# Patient Record
Sex: Female | Born: 1935
Health system: Southern US, Community
[De-identification: ages and names within clinical notes are randomized; demographics above are authoritative.]

## PROBLEM LIST (undated history)

## (undated) DIAGNOSIS — H532 Diplopia: Secondary | ICD-10-CM

## (undated) DIAGNOSIS — I10 Essential (primary) hypertension: Secondary | ICD-10-CM

## (undated) DIAGNOSIS — E119 Type 2 diabetes mellitus without complications: Secondary | ICD-10-CM

## (undated) DIAGNOSIS — N189 Chronic kidney disease, unspecified: Secondary | ICD-10-CM

## (undated) DIAGNOSIS — D649 Anemia, unspecified: Secondary | ICD-10-CM

## (undated) DIAGNOSIS — E7800 Pure hypercholesterolemia, unspecified: Secondary | ICD-10-CM

## (undated) DIAGNOSIS — G8929 Other chronic pain: Secondary | ICD-10-CM

## (undated) HISTORY — DX: Diplopia: H53.2

## (undated) HISTORY — PX: CHOLECYSTECTOMY: SHX55

## (undated) HISTORY — PX: CYST EXCISION: SHX5701

---

## 2003-04-10 ENCOUNTER — Ambulatory Visit (HOSPITAL_COMMUNITY): Admission: RE | Admit: 2003-04-10 | Discharge: 2003-04-10 | Payer: Self-pay | Admitting: Orthopaedic Surgery

## 2003-04-10 ENCOUNTER — Encounter: Payer: Self-pay | Admitting: Orthopaedic Surgery

## 2003-04-20 ENCOUNTER — Encounter: Payer: Self-pay | Admitting: Neurosurgery

## 2003-04-24 ENCOUNTER — Encounter: Payer: Self-pay | Admitting: Neurosurgery

## 2003-04-24 ENCOUNTER — Encounter (INDEPENDENT_AMBULATORY_CARE_PROVIDER_SITE_OTHER): Payer: Self-pay | Admitting: Specialist

## 2003-04-24 ENCOUNTER — Inpatient Hospital Stay (HOSPITAL_COMMUNITY): Admission: RE | Admit: 2003-04-24 | Discharge: 2003-04-26 | Payer: Self-pay | Admitting: Neurosurgery

## 2004-08-08 ENCOUNTER — Ambulatory Visit (HOSPITAL_COMMUNITY): Admission: RE | Admit: 2004-08-08 | Discharge: 2004-08-08 | Payer: Self-pay | Admitting: Family Medicine

## 2004-09-30 ENCOUNTER — Observation Stay (HOSPITAL_COMMUNITY): Admission: RE | Admit: 2004-09-30 | Discharge: 2004-10-01 | Payer: Self-pay | Admitting: General Surgery

## 2006-06-28 ENCOUNTER — Ambulatory Visit (HOSPITAL_COMMUNITY): Admission: RE | Admit: 2006-06-28 | Discharge: 2006-06-28 | Payer: Self-pay | Admitting: Family Medicine

## 2008-02-02 ENCOUNTER — Ambulatory Visit (HOSPITAL_COMMUNITY): Admission: RE | Admit: 2008-02-02 | Discharge: 2008-02-02 | Payer: Self-pay | Admitting: Nephrology

## 2010-07-25 DIAGNOSIS — N189 Chronic kidney disease, unspecified: Secondary | ICD-10-CM | POA: Insufficient documentation

## 2010-07-25 DIAGNOSIS — I10 Essential (primary) hypertension: Secondary | ICD-10-CM | POA: Insufficient documentation

## 2010-07-25 DIAGNOSIS — E1165 Type 2 diabetes mellitus with hyperglycemia: Secondary | ICD-10-CM | POA: Insufficient documentation

## 2010-07-25 DIAGNOSIS — M545 Low back pain, unspecified: Secondary | ICD-10-CM | POA: Insufficient documentation

## 2010-07-25 DIAGNOSIS — D631 Anemia in chronic kidney disease: Secondary | ICD-10-CM | POA: Insufficient documentation

## 2010-07-25 DIAGNOSIS — E7800 Pure hypercholesterolemia, unspecified: Secondary | ICD-10-CM | POA: Insufficient documentation

## 2010-07-25 DIAGNOSIS — E1121 Type 2 diabetes mellitus with diabetic nephropathy: Secondary | ICD-10-CM | POA: Insufficient documentation

## 2010-10-26 ENCOUNTER — Encounter: Payer: Self-pay | Admitting: Family Medicine

## 2011-02-20 NOTE — Op Note (Signed)
   NAME:  Stephanie Middleton, Stephanie Middleton                        ACCOUNT NO.:  [id number]   MEDICAL RECORD NO.:  [id number]                   PATIENT TYPE:  INP   LOCATION:  3172                                 FACILITY:  [facility]   PHYSICIAN:  Leeroy Cha, M.D.                DATE OF BIRTH:  May 18, 1931   DATE OF PROCEDURE:  04/24/2003  DATE OF DISCHARGE:                                 OPERATIVE REPORT   PREOPERATIVE DIAGNOSIS:  Right L4-L5 synovial cyst with compression on the  thecal sac and L5 radiculopathy.   POSTOPERATIVE DIAGNOSIS:  Right L4-L5 synovial cyst with compression on the  thecal sac and L5 radiculopathy.   PROCEDURES:  Right L4-5 laminotomy, excision of a large synovial cyst,  decompression of the thecal sac and L4 nerve root, microscope.  Foraminotomy.   SURGEON:  Leeroy Cha, M.D.   ASSISTANT:  Hosie Spangle, M.D.   CLINICAL HISTORY:  The patient is admitted because of back and right leg  pain.  The patient had been complaining of pain with weakness.  MRI shows a  large cyst at the level of 4-5.  Surgery was advised.  The risks were  explained to the patient including __________.   PROCEDURE:  The patient was taken to the OR, and she was positioned in a  prone manner.  The back was prepped with Betadine.  A midline incision from  L4 to L5 was made.  Muscle was retracted laterally.  X-ray showed that  indeed we were at the level of L4-5.  We brought the microscope into the  area and with the drill, we drilled the lower lamina of L4 and the upper of  L5.  A thick yellow ligament was also excised.  Indeed, we found that there  was a cyst attached to the thecal sac and going along the L5 nerve root.  Microdissection was removed and the cyst was removed in to toto.  Having  done this, investigation of the L5 and L4 nerve root was normal.  Foraminotomy was accomplished.  Valsalva maneuver was negative.  From then  on the area was irrigated.  The wound was closed with  Vicryl and a Steri-  Strip.  The patient did well.                                                 Leeroy Cha, M.D.    EB/MEDQ  D:  04/24/2003  T:  04/24/2003  Job:  [id number]

## 2011-02-20 NOTE — Op Note (Signed)
NAME:  [name], [name]              ACCOUNT NO.:  [id number]   MEDICAL RECORD NO.:  [id number]          PATIENT TYPE:  AMB   LOCATION:  DAY                           FACILITY:  [facility]   PHYSICIAN:  Leroy C. Tamala Julian, M.D.   DATE OF BIRTH:  [birth year]   DATE OF PROCEDURE:  09/30/2004  DATE OF DISCHARGE:                                 OPERATIVE REPORT   PREOPERATIVE DIAGNOSIS:  Cholelithiasis, cholecystitis.   POSTOPERATIVE DIAGNOSIS:  Cholelithiasis, cholecystitis.   PROCEDURE:  Laparoscopic cholecystectomy.   SURGEON:  Vernon Prey. Tamala Julian, M.D.   DESCRIPTION:  Under general endotracheal anesthesia, the patient's abdomen  was prepped and draped in a sterile field.  A supraumbilical incision was  made and Veress needle was inserted uneventfully.  Abdomen was insufflated  with 3 L of CO2.  Using a Visiport guide, a 10 mm port was placed  uneventfully.  The laparoscope was placed.  A distended, thickened  gallbladder was evident.  Under videoscopic guidance, a 10 mm port and two 5  mm ports were placed in the right subcostal region.  The gallbladder was  grasped and positioned.  The cystic duct was dissected, clipped with five  clips and divided.  There were two cystic artery branches.  These were  _clipped_________ with three clips and divided.  The gallbladder was  separated from its hepatic bed without difficulty.  There was no bleeding  and no bile leak.  The gallbladder was placed in an EndoCatch device and  retrieved.  Irrigation was carried out.  The fluid was entirely clear.  CO2  was allowed to escape from the abdomen, and the ports were removed.  The  incisions were closed using 0 Dexon on the fascia at the umbilicus and  staples on the skin.  Dressings were placed.  She was awakened from  anesthesia uneventfully, transferred to a bed, and taken to the  postanesthetic care unit for further monitoring.     Lero   LCS/MEDQ  D:  09/30/2004  T:  09/30/2004  Job:  [id number]

## 2011-02-20 NOTE — H&P (Signed)
NAME:  BHARGAVI, WALLOCK                        ACCOUNT NO.:  [id number]   MEDICAL RECORD NO.:  [id number]                   PATIENT TYPE:  INP   LOCATION:  3172                                 FACILITY:  [facility]   PHYSICIAN:  Leeroy Cha, M.D.                DATE OF BIRTH:  [birth year]   DATE OF ADMISSION:  04/24/2003  DATE OF DISCHARGE:                                HISTORY & PHYSICAL   HISTORY OF PRESENT ILLNESS:  Mrs. Zaring is a lady who was seen in my office  last week complaining of back pain with radiation down to the right leg  since April of this year, which is getting worse.  Any type of movement  triggered the pain going to the right foot.  She is quite uncomfortable.  She denies any problem with the left leg.  She had an MRI and because of the  findings, she wants to proceed with surgery.   PAST MEDICAL HISTORY:  Negative.   MEDICATIONS:  She is taking medication for her diabetes, some verapamil,  Diovan, and another one that she does not recall the name of.   SOCIAL HISTORY:  Negative.   FAMILY HISTORY:  Her mother died of leukemia.  Her father died of heart  disease.   REVIEW OF SYSTEMS:  Positive for back and right leg pain and diabetes.   PHYSICAL EXAMINATION:  GENERAL APPEARANCE:  When the patient came to see me,  she was limping from the right leg.  HEENT:  Normal.  NECK:  Normal.  LUNGS:  There is some mild rhonchi bilaterally.  HEART:  Heart sounds normal.  ABDOMEN:  Normal.  EXTREMITIES:  Normal.  BACK:  She has had diffuse tenderness to the lumbar spine.  NEUROLOGIC:  Mental status normal.  Cranial nerves normal.  Her strength is  completely normal, except that she has 3/5 weakness on dorsiflexion of the  right foot.  Reflexes 1+.  Sensation:  She complained of numbness on the top  of the right foot.   LABORATORY DATA:  The MRI shows multiple levels of degenerative joint  disease, but she has a large synovial cyst with displacement of the thecal  sac at the level of L4-5.   CLINICAL IMPRESSION:  1. Synovial cyst at right L4-5.  2. Degenerative disk disease.  3. Anemia.   RECOMMENDATIONS:  The patient wants to proceed with surgery.  The procedure  will be a right L4-5 diskectomy with foraminotomy.  She knows about the  risks, such as the need for further surgery with failure of  fusion, CSF leak, and infection.  Also, she is aware that we do not know why  she is having anemia.  We called her medical doctor and they do not have any  information.  After surgery, we are going to obtain a CBC and probably we  can get one of the hematologists to see her as  an inpatient or outpatient.                                               Leeroy Cha, M.D.    EB/MEDQ  D:  04/24/2003  T:  04/24/2003  Job:  [id number]

## 2011-02-20 NOTE — H&P (Signed)
NAME:  Stephanie Middleton, Stephanie Middleton              ACCOUNT NO.:  [id number]   MEDICAL RECORD NO.:  [id number]          PATIENT TYPE:  AMB   LOCATION:  DAY                           FACILITY:  [facility]   PHYSICIAN:  Leroy C. Tamala Julian, M.D.   DATE OF BIRTH:  16-Dec-1930   DATE OF ADMISSION:  DATE OF DISCHARGE:  LH                                HISTORY & PHYSICAL   HISTORY OF PRESENT ILLNESS:  A 77 year old female with a history of upper  abdominal pain with nausea and vomiting in October of 2005.  The patient had  an ultrasound done at that time which showed gallbladder wall thickening  with multiple gallstones, compatible with acute cholecystitis.  She has been  advised to proceed with cholecystectomy.   PAST HISTORY:  1.  Diabetes mellitus.  2.  Chronic anemia.  3.  Hypertension.  4.  Hyperlipidemia.   MEDICATIONS:  1.  Avandia 4 mg daily.  2.  Accupril 40 mg daily.  3.  Verapamil 240 mg daily.  4.  Glipizide 5 mg b.i.d.  5.  Prevacid 30 mg daily.   PHYSICAL EXAMINATION:  VITAL SIGNS:  Blood pressure 120/60, pulse 84,  respirations 18.  Weight 192 pounds.  HEENT:  Unremarkable.  NECK:  Supple.  No JVD or bruit.  CHEST:  Clear to auscultation.  HEART:  Regular rate and rhythm without murmur, gallop, or rub.  ABDOMEN:  Soft, nontender.  No masses.  EXTREMITIES:  No clubbing, cyanosis, or edema.  NEUROLOGIC:  No focal motor, sensory, or cerebellar deficit.   IMPRESSION:  1.  Cholecystitis with cholelithiasis.  2.  Diabetes mellitus.  3.  Hypertension.  4.  Chronic anemia.   PLAN:  Laparoscopic cholecystectomy.     Lero   LCS/MEDQ  D:  09/29/2004  T:  09/30/2004  Job:  [id number]

## 2013-08-08 ENCOUNTER — Other Ambulatory Visit (HOSPITAL_COMMUNITY): Payer: Self-pay | Admitting: Family Medicine

## 2013-08-08 ENCOUNTER — Ambulatory Visit (HOSPITAL_COMMUNITY)
Admission: RE | Admit: 2013-08-08 | Discharge: 2013-08-08 | Disposition: A | Discharge status: Home / Self Care | Payer: Medicare PPO | Source: Ambulatory Visit | Attending: Family Medicine | Admitting: Family Medicine

## 2013-08-08 DIAGNOSIS — R52 Pain, unspecified: Secondary | ICD-10-CM

## 2013-08-08 DIAGNOSIS — M79609 Pain in unspecified limb: Secondary | ICD-10-CM | POA: Insufficient documentation

## 2013-12-11 ENCOUNTER — Ambulatory Visit (HOSPITAL_COMMUNITY)
Admission: RE | Admit: 2013-12-11 | Discharge: 2013-12-11 | Disposition: A | Discharge status: Home / Self Care | Payer: Medicare PPO | Source: Ambulatory Visit | Attending: Family Medicine | Admitting: Family Medicine

## 2013-12-11 ENCOUNTER — Other Ambulatory Visit (HOSPITAL_COMMUNITY): Payer: Self-pay | Admitting: Family Medicine

## 2013-12-11 DIAGNOSIS — G8929 Other chronic pain: Secondary | ICD-10-CM

## 2013-12-11 DIAGNOSIS — M25569 Pain in unspecified knee: Secondary | ICD-10-CM | POA: Insufficient documentation

## 2014-12-12 DIAGNOSIS — I10 Essential (primary) hypertension: Secondary | ICD-10-CM | POA: Diagnosis not present

## 2014-12-12 DIAGNOSIS — D638 Anemia in other chronic diseases classified elsewhere: Secondary | ICD-10-CM | POA: Diagnosis not present

## 2014-12-12 DIAGNOSIS — N183 Chronic kidney disease, stage 3 (moderate): Secondary | ICD-10-CM | POA: Diagnosis not present

## 2014-12-12 DIAGNOSIS — N2581 Secondary hyperparathyroidism of renal origin: Secondary | ICD-10-CM | POA: Diagnosis not present

## 2015-03-16 DIAGNOSIS — D519 Vitamin B12 deficiency anemia, unspecified: Secondary | ICD-10-CM | POA: Diagnosis not present

## 2015-03-16 DIAGNOSIS — R809 Proteinuria, unspecified: Secondary | ICD-10-CM | POA: Diagnosis not present

## 2015-03-16 DIAGNOSIS — Z79899 Other long term (current) drug therapy: Secondary | ICD-10-CM | POA: Diagnosis not present

## 2015-03-16 DIAGNOSIS — N183 Chronic kidney disease, stage 3 (moderate): Secondary | ICD-10-CM | POA: Diagnosis not present

## 2015-03-16 DIAGNOSIS — I10 Essential (primary) hypertension: Secondary | ICD-10-CM | POA: Diagnosis not present

## 2015-03-16 DIAGNOSIS — E559 Vitamin D deficiency, unspecified: Secondary | ICD-10-CM | POA: Diagnosis not present

## 2015-03-20 DIAGNOSIS — I10 Essential (primary) hypertension: Secondary | ICD-10-CM | POA: Diagnosis not present

## 2015-03-20 DIAGNOSIS — N2581 Secondary hyperparathyroidism of renal origin: Secondary | ICD-10-CM | POA: Diagnosis not present

## 2015-03-20 DIAGNOSIS — N183 Chronic kidney disease, stage 3 (moderate): Secondary | ICD-10-CM | POA: Diagnosis not present

## 2015-03-20 DIAGNOSIS — D638 Anemia in other chronic diseases classified elsewhere: Secondary | ICD-10-CM | POA: Diagnosis not present

## 2015-04-25 DIAGNOSIS — I10 Essential (primary) hypertension: Secondary | ICD-10-CM | POA: Diagnosis not present

## 2015-04-25 DIAGNOSIS — E1122 Type 2 diabetes mellitus with diabetic chronic kidney disease: Secondary | ICD-10-CM | POA: Diagnosis not present

## 2015-05-17 DIAGNOSIS — H5211 Myopia, right eye: Secondary | ICD-10-CM | POA: Diagnosis not present

## 2015-05-17 DIAGNOSIS — H25819 Combined forms of age-related cataract, unspecified eye: Secondary | ICD-10-CM | POA: Diagnosis not present

## 2015-05-17 DIAGNOSIS — H18419 Arcus senilis, unspecified eye: Secondary | ICD-10-CM | POA: Diagnosis not present

## 2015-05-17 DIAGNOSIS — H18899 Other specified disorders of cornea, unspecified eye: Secondary | ICD-10-CM | POA: Diagnosis not present

## 2015-07-10 DIAGNOSIS — I10 Essential (primary) hypertension: Secondary | ICD-10-CM | POA: Diagnosis not present

## 2015-07-10 DIAGNOSIS — Z79899 Other long term (current) drug therapy: Secondary | ICD-10-CM | POA: Diagnosis not present

## 2015-07-10 DIAGNOSIS — D519 Vitamin B12 deficiency anemia, unspecified: Secondary | ICD-10-CM | POA: Diagnosis not present

## 2015-07-10 DIAGNOSIS — N183 Chronic kidney disease, stage 3 (moderate): Secondary | ICD-10-CM | POA: Diagnosis not present

## 2015-07-10 DIAGNOSIS — R809 Proteinuria, unspecified: Secondary | ICD-10-CM | POA: Diagnosis not present

## 2015-07-10 DIAGNOSIS — E559 Vitamin D deficiency, unspecified: Secondary | ICD-10-CM | POA: Diagnosis not present

## 2015-07-17 DIAGNOSIS — D638 Anemia in other chronic diseases classified elsewhere: Secondary | ICD-10-CM | POA: Diagnosis not present

## 2015-07-17 DIAGNOSIS — R809 Proteinuria, unspecified: Secondary | ICD-10-CM | POA: Diagnosis not present

## 2015-07-17 DIAGNOSIS — N184 Chronic kidney disease, stage 4 (severe): Secondary | ICD-10-CM | POA: Diagnosis not present

## 2015-07-17 DIAGNOSIS — N2581 Secondary hyperparathyroidism of renal origin: Secondary | ICD-10-CM | POA: Diagnosis not present

## 2015-07-30 DIAGNOSIS — R739 Hyperglycemia, unspecified: Secondary | ICD-10-CM | POA: Diagnosis not present

## 2015-07-30 DIAGNOSIS — E1122 Type 2 diabetes mellitus with diabetic chronic kidney disease: Secondary | ICD-10-CM | POA: Diagnosis not present

## 2015-07-30 DIAGNOSIS — N183 Chronic kidney disease, stage 3 (moderate): Secondary | ICD-10-CM | POA: Diagnosis not present

## 2015-07-30 DIAGNOSIS — Z23 Encounter for immunization: Secondary | ICD-10-CM | POA: Diagnosis not present

## 2015-10-02 DIAGNOSIS — R809 Proteinuria, unspecified: Secondary | ICD-10-CM | POA: Diagnosis not present

## 2015-10-02 DIAGNOSIS — D519 Vitamin B12 deficiency anemia, unspecified: Secondary | ICD-10-CM | POA: Diagnosis not present

## 2015-10-02 DIAGNOSIS — Z79899 Other long term (current) drug therapy: Secondary | ICD-10-CM | POA: Diagnosis not present

## 2015-10-02 DIAGNOSIS — I10 Essential (primary) hypertension: Secondary | ICD-10-CM | POA: Diagnosis not present

## 2015-10-02 DIAGNOSIS — E559 Vitamin D deficiency, unspecified: Secondary | ICD-10-CM | POA: Diagnosis not present

## 2015-10-02 DIAGNOSIS — N183 Chronic kidney disease, stage 3 (moderate): Secondary | ICD-10-CM | POA: Diagnosis not present

## 2016-01-28 ENCOUNTER — Other Ambulatory Visit (HOSPITAL_COMMUNITY): Payer: Self-pay | Admitting: Family Medicine

## 2016-01-28 ENCOUNTER — Ambulatory Visit (HOSPITAL_COMMUNITY)
Admission: RE | Admit: 2016-01-28 | Discharge: 2016-01-28 | Disposition: A | Discharge status: Home / Self Care | Payer: Medicare Other | Source: Ambulatory Visit | Attending: Family Medicine | Admitting: Family Medicine

## 2016-01-28 DIAGNOSIS — G8929 Other chronic pain: Secondary | ICD-10-CM | POA: Diagnosis present

## 2016-01-28 DIAGNOSIS — M25551 Pain in right hip: Secondary | ICD-10-CM | POA: Diagnosis not present

## 2016-01-28 DIAGNOSIS — M47896 Other spondylosis, lumbar region: Secondary | ICD-10-CM | POA: Diagnosis not present

## 2016-05-13 ENCOUNTER — Other Ambulatory Visit (HOSPITAL_COMMUNITY): Payer: Self-pay | Admitting: Rheumatology

## 2016-05-13 DIAGNOSIS — M5416 Radiculopathy, lumbar region: Secondary | ICD-10-CM

## 2016-05-26 ENCOUNTER — Ambulatory Visit (HOSPITAL_COMMUNITY)
Admission: RE | Admit: 2016-05-26 | Discharge: 2016-05-26 | Disposition: A | Discharge status: Home / Self Care | Payer: Medicare Other | Source: Ambulatory Visit | Attending: Rheumatology | Admitting: Rheumatology

## 2016-05-26 DIAGNOSIS — M545 Low back pain: Secondary | ICD-10-CM | POA: Insufficient documentation

## 2016-05-26 DIAGNOSIS — M5416 Radiculopathy, lumbar region: Secondary | ICD-10-CM

## 2016-05-26 DIAGNOSIS — M4806 Spinal stenosis, lumbar region: Secondary | ICD-10-CM | POA: Diagnosis not present

## 2016-07-23 ENCOUNTER — Encounter (HOSPITAL_COMMUNITY)
Admission: RE | Admit: 2016-07-23 | Discharge: 2016-07-23 | Disposition: A | Discharge status: Home / Self Care | Payer: Medicare Other | Source: Ambulatory Visit | Attending: Nephrology | Admitting: Nephrology

## 2016-07-23 ENCOUNTER — Encounter (HOSPITAL_COMMUNITY): Payer: Self-pay

## 2016-07-23 DIAGNOSIS — N184 Chronic kidney disease, stage 4 (severe): Secondary | ICD-10-CM | POA: Diagnosis not present

## 2016-07-23 DIAGNOSIS — D509 Iron deficiency anemia, unspecified: Secondary | ICD-10-CM | POA: Insufficient documentation

## 2016-07-23 LAB — POCT HEMOGLOBIN-HEMACUE: Hemoglobin: 8.7 g/dL — ABNORMAL LOW (ref 12.0–15.0)

## 2016-07-23 MED ORDER — EPOETIN ALFA 3000 UNIT/ML IJ SOLN
4000.0000 [IU] | Freq: Once | INTRAMUSCULAR | Status: AC
  Administered 2016-07-23: 4000 [IU] via SUBCUTANEOUS
  Filled 2016-07-23: qty 2

## 2016-07-23 NOTE — Progress Notes (Signed)
Results for VILDA, ZOLLNER (MRN [id number]) as of 07/23/2016 13:42  Ref. Range 07/23/2016 13:06  Hemoglobin Latest Ref Range: 12.0 - 15.0 g/dL 8.7 (L)

## 2016-08-06 ENCOUNTER — Encounter (HOSPITAL_COMMUNITY)
Admission: RE | Admit: 2016-08-06 | Discharge: 2016-08-06 | Disposition: A | Discharge status: Home / Self Care | Payer: Medicare Other | Source: Ambulatory Visit | Attending: Nephrology | Admitting: Nephrology

## 2016-08-06 DIAGNOSIS — Z5181 Encounter for therapeutic drug level monitoring: Secondary | ICD-10-CM | POA: Insufficient documentation

## 2016-08-06 DIAGNOSIS — D509 Iron deficiency anemia, unspecified: Secondary | ICD-10-CM | POA: Insufficient documentation

## 2016-08-06 DIAGNOSIS — N184 Chronic kidney disease, stage 4 (severe): Secondary | ICD-10-CM | POA: Insufficient documentation

## 2016-08-06 DIAGNOSIS — Z79899 Other long term (current) drug therapy: Secondary | ICD-10-CM | POA: Diagnosis not present

## 2016-08-06 LAB — POCT HEMOGLOBIN-HEMACUE: Hemoglobin: 8.7 g/dL — ABNORMAL LOW (ref 12.0–15.0)

## 2016-08-06 MED ORDER — EPOETIN ALFA 4000 UNIT/ML IJ SOLN
INTRAMUSCULAR | Status: AC
  Filled 2016-08-06: qty 1

## 2016-08-06 MED ORDER — EPOETIN ALFA 4000 UNIT/ML IJ SOLN
4000.0000 [IU] | Freq: Once | INTRAMUSCULAR | Status: AC
  Administered 2016-08-06: 4000 [IU] via SUBCUTANEOUS

## 2016-08-24 ENCOUNTER — Encounter (HOSPITAL_COMMUNITY)
Admission: RE | Admit: 2016-08-24 | Discharge: 2016-08-24 | Disposition: A | Discharge status: Home / Self Care | Payer: Medicare Other | Source: Ambulatory Visit | Attending: Nephrology | Admitting: Nephrology

## 2016-08-24 DIAGNOSIS — N184 Chronic kidney disease, stage 4 (severe): Secondary | ICD-10-CM | POA: Diagnosis not present

## 2016-08-24 LAB — POCT HEMOGLOBIN-HEMACUE: Hemoglobin: 9.5 g/dL — ABNORMAL LOW (ref 12.0–15.0)

## 2016-08-24 MED ORDER — EPOETIN ALFA 4000 UNIT/ML IJ SOLN
4000.0000 [IU] | Freq: Once | INTRAMUSCULAR | Status: AC
  Administered 2016-08-24: 4000 [IU] via SUBCUTANEOUS

## 2016-08-24 MED ORDER — EPOETIN ALFA 4000 UNIT/ML IJ SOLN
INTRAMUSCULAR | Status: AC
  Filled 2016-08-24: qty 1

## 2016-08-24 MED ORDER — EPOETIN ALFA 2000 UNIT/ML IJ SOLN: 4000.0000 [IU] | Freq: Once | INTRAMUSCULAR | Status: DC

## 2016-08-24 NOTE — Progress Notes (Signed)
Results for Stephanie Middleton, Stephanie Middleton (MRN [id number]) as of 08/24/2016 09:55  Ref. Range 08/24/2016 09:53  Hemoglobin Latest Ref Range: 12.0 - 15.0 g/dL 9.5 (L)

## 2016-09-07 ENCOUNTER — Encounter (HOSPITAL_COMMUNITY)
Admission: RE | Admit: 2016-09-07 | Discharge: 2016-09-07 | Disposition: A | Discharge status: Home / Self Care | Payer: Medicare Other | Source: Ambulatory Visit | Attending: Nephrology | Admitting: Nephrology

## 2016-09-07 DIAGNOSIS — N184 Chronic kidney disease, stage 4 (severe): Secondary | ICD-10-CM | POA: Insufficient documentation

## 2016-09-07 DIAGNOSIS — D509 Iron deficiency anemia, unspecified: Secondary | ICD-10-CM | POA: Insufficient documentation

## 2016-09-07 LAB — POCT HEMOGLOBIN-HEMACUE: HEMOGLOBIN: 9.1 g/dL — AB (ref 12.0–15.0)

## 2016-09-07 MED ORDER — EPOETIN ALFA 4000 UNIT/ML IJ SOLN
INTRAMUSCULAR | Status: AC
  Filled 2016-09-07: qty 1

## 2016-09-07 MED ORDER — EPOETIN ALFA 4000 UNIT/ML IJ SOLN
4000.0000 [IU] | INTRAMUSCULAR | Status: DC
  Administered 2016-09-07: 4000 [IU] via SUBCUTANEOUS

## 2016-09-07 NOTE — Progress Notes (Signed)
Results for MORAYMA, GODOWN (MRN [id number]) as of 09/07/2016 09:54  Ref. Range 08/24/2016 09:53  Hemoglobin Latest Ref Range: 12.0 - 15.0 g/dL 9.5 (L)   Procrit 4000 units SQ given per MD order. Next appointment 09/21/16.

## 2016-09-21 ENCOUNTER — Encounter (HOSPITAL_COMMUNITY): Payer: Self-pay

## 2016-09-21 ENCOUNTER — Encounter (HOSPITAL_COMMUNITY)
Admission: RE | Admit: 2016-09-21 | Discharge: 2016-09-21 | Disposition: A | Discharge status: Home / Self Care | Payer: Medicare Other | Source: Ambulatory Visit | Attending: Nephrology | Admitting: Nephrology

## 2016-09-21 DIAGNOSIS — N184 Chronic kidney disease, stage 4 (severe): Secondary | ICD-10-CM | POA: Diagnosis not present

## 2016-09-21 LAB — POCT HEMOGLOBIN-HEMACUE: HEMOGLOBIN: 9.8 g/dL — AB (ref 12.0–15.0)

## 2016-09-21 MED ORDER — EPOETIN ALFA 4000 UNIT/ML IJ SOLN
INTRAMUSCULAR | Status: AC
  Filled 2016-09-21: qty 1

## 2016-09-21 MED ORDER — EPOETIN ALFA 4000 UNIT/ML IJ SOLN
4000.0000 [IU] | Freq: Once | INTRAMUSCULAR | Status: AC
  Administered 2016-09-21: 4000 [IU] via SUBCUTANEOUS

## 2016-09-21 NOTE — Progress Notes (Signed)
Results for Stephanie Middleton, Stephanie Middleton (MRN [id number]) as of 09/21/2016 10:54  Ref. Range 09/21/2016 10:03  Hemoglobin Latest Ref Range: 12.0 - 15.0 g/dL 9.8 (L)

## 2016-10-06 ENCOUNTER — Encounter (HOSPITAL_COMMUNITY)
Admission: RE | Admit: 2016-10-06 | Discharge: 2016-10-06 | Disposition: A | Discharge status: Home / Self Care | Payer: Medicare Other | Source: Ambulatory Visit | Attending: Nephrology | Admitting: Nephrology

## 2016-10-06 DIAGNOSIS — D509 Iron deficiency anemia, unspecified: Secondary | ICD-10-CM | POA: Diagnosis not present

## 2016-10-06 DIAGNOSIS — N184 Chronic kidney disease, stage 4 (severe): Secondary | ICD-10-CM | POA: Diagnosis not present

## 2016-10-06 LAB — POCT HEMOGLOBIN-HEMACUE: Hemoglobin: 9.6 g/dL — ABNORMAL LOW (ref 12.0–15.0)

## 2016-10-06 MED ORDER — EPOETIN ALFA 4000 UNIT/ML IJ SOLN
4000.0000 [IU] | Freq: Once | INTRAMUSCULAR | Status: AC
  Administered 2016-10-06: 4000 [IU] via SUBCUTANEOUS

## 2016-10-06 MED ORDER — EPOETIN ALFA 4000 UNIT/ML IJ SOLN
INTRAMUSCULAR | Status: AC
  Filled 2016-10-06: qty 1

## 2016-10-06 NOTE — Progress Notes (Signed)
Results for Stephanie Middleton, Stephanie Middleton (MRN [id number]) as of 10/06/2016 10:43  Ref. Range 10/06/2016 10:07  Hemoglobin Latest Ref Range: 12.0 - 15.0 g/dL 9.6 (L)

## 2016-10-20 ENCOUNTER — Encounter (HOSPITAL_COMMUNITY)
Admission: RE | Admit: 2016-10-20 | Discharge: 2016-10-20 | Disposition: A | Discharge status: Home / Self Care | Payer: Medicare Other | Source: Ambulatory Visit | Attending: Nephrology | Admitting: Nephrology

## 2016-10-20 DIAGNOSIS — N184 Chronic kidney disease, stage 4 (severe): Secondary | ICD-10-CM | POA: Diagnosis not present

## 2016-10-20 LAB — POCT HEMOGLOBIN-HEMACUE: HEMOGLOBIN: 9.3 g/dL — AB (ref 12.0–15.0)

## 2016-10-20 MED ORDER — EPOETIN ALFA 4000 UNIT/ML IJ SOLN
INTRAMUSCULAR | Status: AC
  Filled 2016-10-20: qty 1

## 2016-10-20 MED ORDER — EPOETIN ALFA 4000 UNIT/ML IJ SOLN
4000.0000 [IU] | INTRAMUSCULAR | Status: DC
Start: 2016-10-20 — End: 2016-10-21
  Administered 2016-10-20: 4000 [IU] via SUBCUTANEOUS

## 2016-10-20 NOTE — Progress Notes (Signed)
Results for Stephanie Middleton, Stephanie Middleton (MRN [id number]) as of 10/20/2016 10:12  Ref. Range 10/20/2016 10:10  Hemoglobin Latest Ref Range: 12.0 - 15.0 g/dL 9.3 (L)

## 2016-11-03 ENCOUNTER — Encounter (HOSPITAL_COMMUNITY)
Admission: RE | Admit: 2016-11-03 | Discharge: 2016-11-03 | Disposition: A | Discharge status: Home / Self Care | Payer: Medicare Other | Source: Ambulatory Visit | Attending: Nephrology | Admitting: Nephrology

## 2016-11-03 DIAGNOSIS — N184 Chronic kidney disease, stage 4 (severe): Secondary | ICD-10-CM | POA: Diagnosis not present

## 2016-11-03 LAB — POCT HEMOGLOBIN-HEMACUE: Hemoglobin: 9.6 g/dL — ABNORMAL LOW (ref 12.0–15.0)

## 2016-11-03 MED ORDER — EPOETIN ALFA 4000 UNIT/ML IJ SOLN
INTRAMUSCULAR | Status: AC
  Filled 2016-11-03: qty 1

## 2016-11-03 MED ORDER — EPOETIN ALFA 4000 UNIT/ML IJ SOLN
4000.0000 [IU] | Freq: Once | INTRAMUSCULAR | Status: AC
  Administered 2016-11-03: 4000 [IU] via SUBCUTANEOUS

## 2016-11-05 NOTE — Progress Notes (Signed)
Results for Stephanie Middleton, Stephanie Middleton (MRN [id number]) as of 11/05/2016 11:04  Ref. Range 11/03/2016 10:08  Hemoglobin Latest Ref Range: 12.0 - 15.0 g/dL 9.6 (L)

## 2016-11-17 ENCOUNTER — Encounter (HOSPITAL_COMMUNITY)
Admission: RE | Admit: 2016-11-17 | Discharge: 2016-11-17 | Disposition: A | Discharge status: Home / Self Care | Payer: Medicare Other | Source: Ambulatory Visit | Attending: Nephrology | Admitting: Nephrology

## 2016-11-17 ENCOUNTER — Encounter (HOSPITAL_COMMUNITY): Payer: Self-pay

## 2016-11-17 DIAGNOSIS — D509 Iron deficiency anemia, unspecified: Secondary | ICD-10-CM | POA: Insufficient documentation

## 2016-11-17 DIAGNOSIS — N184 Chronic kidney disease, stage 4 (severe): Secondary | ICD-10-CM | POA: Insufficient documentation

## 2016-11-17 LAB — POCT HEMOGLOBIN-HEMACUE: HEMOGLOBIN: 10 g/dL — AB (ref 12.0–15.0)

## 2016-11-17 NOTE — Progress Notes (Signed)
Results for Stephanie Middleton, Stephanie Middleton (MRN [id number]) as of 11/17/2016 10:16 No procrit given today, next appointment 12/01/2016 @ 1100.  Ref. Range 11/17/2016 09:59  Hemoglobin Latest Ref Range: 12.0 - 15.0 g/dL 10.0 (L)

## 2016-12-01 ENCOUNTER — Encounter (HOSPITAL_COMMUNITY): Payer: Self-pay

## 2016-12-01 ENCOUNTER — Encounter (HOSPITAL_COMMUNITY)
Admission: RE | Admit: 2016-12-01 | Discharge: 2016-12-01 | Disposition: A | Discharge status: Home / Self Care | Payer: Medicare Other | Source: Ambulatory Visit | Attending: Nephrology | Admitting: Nephrology

## 2016-12-01 DIAGNOSIS — N184 Chronic kidney disease, stage 4 (severe): Secondary | ICD-10-CM | POA: Diagnosis not present

## 2016-12-01 LAB — HEMOGLOBIN AND HEMATOCRIT, BLOOD
HCT: 30.5 % — ABNORMAL LOW (ref 36.0–46.0)
Hemoglobin: 10 g/dL — ABNORMAL LOW (ref 12.0–15.0)

## 2016-12-01 NOTE — Progress Notes (Signed)
Results for DI, JASMER (MRN [id number]) as of 12/01/2016 13:53  Ref. Range 12/01/2016 11:05  Hemoglobin Latest Ref Range: 12.0 - 15.0 g/dL 10.0 (L)  HCT Latest Ref Range: 36.0 - 46.0 % 30.5 (L)

## 2016-12-15 ENCOUNTER — Encounter (HOSPITAL_COMMUNITY)
Admission: RE | Admit: 2016-12-15 | Discharge: 2016-12-15 | Disposition: A | Discharge status: Home / Self Care | Payer: Medicare Other | Source: Ambulatory Visit | Attending: Nephrology | Admitting: Nephrology

## 2016-12-15 DIAGNOSIS — D509 Iron deficiency anemia, unspecified: Secondary | ICD-10-CM | POA: Insufficient documentation

## 2016-12-15 DIAGNOSIS — Z5181 Encounter for therapeutic drug level monitoring: Secondary | ICD-10-CM | POA: Diagnosis not present

## 2016-12-15 DIAGNOSIS — Z79899 Other long term (current) drug therapy: Secondary | ICD-10-CM | POA: Diagnosis not present

## 2016-12-15 DIAGNOSIS — N184 Chronic kidney disease, stage 4 (severe): Secondary | ICD-10-CM | POA: Insufficient documentation

## 2016-12-15 LAB — POCT HEMOGLOBIN-HEMACUE: HEMOGLOBIN: 9.2 g/dL — AB (ref 12.0–15.0)

## 2016-12-15 MED ORDER — EPOETIN ALFA 4000 UNIT/ML IJ SOLN
INTRAMUSCULAR | Status: AC
  Filled 2016-12-15: qty 1

## 2016-12-15 MED ORDER — EPOETIN ALFA 4000 UNIT/ML IJ SOLN
4000.0000 [IU] | Freq: Once | INTRAMUSCULAR | Status: AC
  Administered 2016-12-15: 4000 [IU] via SUBCUTANEOUS

## 2016-12-15 NOTE — Progress Notes (Signed)
Results for KELINA, BEAUCHAMP (MRN [id number]) as of 12/15/2016 11:35  Ref. Range 12/15/2016 10:07  Hemoglobin  Latest Ref Range: 12.0 - 15.0 g/dL 9.2 (L)   Procrit 4000 units SQ given per Md order. Next appt. 12/29/16.

## 2016-12-29 ENCOUNTER — Encounter (HOSPITAL_COMMUNITY)
Admission: RE | Admit: 2016-12-29 | Discharge: 2016-12-29 | Disposition: A | Discharge status: Home / Self Care | Payer: Medicare Other | Source: Ambulatory Visit | Attending: Nephrology | Admitting: Nephrology

## 2016-12-29 DIAGNOSIS — N184 Chronic kidney disease, stage 4 (severe): Secondary | ICD-10-CM | POA: Diagnosis not present

## 2016-12-29 LAB — POCT HEMOGLOBIN-HEMACUE: Hemoglobin: 9.2 g/dL — ABNORMAL LOW (ref 12.0–15.0)

## 2016-12-29 MED ORDER — EPOETIN ALFA 4000 UNIT/ML IJ SOLN
INTRAMUSCULAR | Status: AC
  Filled 2016-12-29: qty 1

## 2016-12-29 MED ORDER — EPOETIN ALFA 4000 UNIT/ML IJ SOLN
4000.0000 [IU] | Freq: Once | INTRAMUSCULAR | Status: AC
  Administered 2016-12-29: 4000 [IU] via SUBCUTANEOUS

## 2016-12-29 NOTE — Progress Notes (Signed)
Results for DAISI, KENTNER (MRN [id number]) as of 12/29/2016 10:18  Ref. Range 12/29/2016 10:07  Hemoglobin Latest Ref Range: 12.0 - 15.0 g/dL 9.2 (L)

## 2017-01-12 ENCOUNTER — Ambulatory Visit (HOSPITAL_COMMUNITY): Payer: Self-pay

## 2017-01-12 ENCOUNTER — Encounter (HOSPITAL_COMMUNITY)
Admission: RE | Admit: 2017-01-12 | Discharge: 2017-01-12 | Disposition: A | Discharge status: Home / Self Care | Payer: Medicare Other | Source: Ambulatory Visit | Attending: Nephrology | Admitting: Nephrology

## 2017-01-12 DIAGNOSIS — N184 Chronic kidney disease, stage 4 (severe): Secondary | ICD-10-CM | POA: Insufficient documentation

## 2017-01-12 DIAGNOSIS — D509 Iron deficiency anemia, unspecified: Secondary | ICD-10-CM | POA: Diagnosis not present

## 2017-01-12 LAB — POCT HEMOGLOBIN-HEMACUE: Hemoglobin: 9.3 g/dL — ABNORMAL LOW (ref 12.0–15.0)

## 2017-01-12 MED ORDER — EPOETIN ALFA 4000 UNIT/ML IJ SOLN
4000.0000 [IU] | Freq: Once | INTRAMUSCULAR | Status: AC
  Administered 2017-01-12: 4000 [IU] via SUBCUTANEOUS

## 2017-01-12 MED ORDER — EPOETIN ALFA 4000 UNIT/ML IJ SOLN
INTRAMUSCULAR | Status: AC
  Filled 2017-01-12: qty 1

## 2017-01-13 NOTE — Progress Notes (Signed)
Results for Stephanie Middleton, Stephanie Middleton (MRN [id number]) as of 01/13/2017 15:56  Ref. Range 01/12/2017 10:05  Hemoglobin Latest Ref Range: 12.0 - 15.0 g/dL 9.3 (L)

## 2017-01-26 ENCOUNTER — Encounter (HOSPITAL_COMMUNITY)
Admission: RE | Admit: 2017-01-26 | Discharge: 2017-01-26 | Disposition: A | Discharge status: Home / Self Care | Payer: Medicare Other | Source: Ambulatory Visit | Attending: Nephrology | Admitting: Nephrology

## 2017-01-26 DIAGNOSIS — N184 Chronic kidney disease, stage 4 (severe): Secondary | ICD-10-CM | POA: Diagnosis not present

## 2017-01-26 LAB — POCT HEMOGLOBIN-HEMACUE: Hemoglobin: 9.2 g/dL — ABNORMAL LOW (ref 12.0–15.0)

## 2017-01-26 MED ORDER — EPOETIN ALFA 4000 UNIT/ML IJ SOLN
INTRAMUSCULAR | Status: AC
  Filled 2017-01-26: qty 1

## 2017-01-26 MED ORDER — EPOETIN ALFA 4000 UNIT/ML IJ SOLN
4000.0000 [IU] | Freq: Once | INTRAMUSCULAR | Status: AC
  Administered 2017-01-26: 4000 [IU] via SUBCUTANEOUS

## 2017-01-26 NOTE — Progress Notes (Signed)
Results for Stephanie Middleton, Stephanie Middleton (MRN [id number]) as of 01/26/2017 11:17  Ref. Range 01/26/2017 09:59  Hemoglobin Latest Ref Range: 12.0 - 15.0 g/dL 9.2 (L)   Procrit given per MD order.

## 2017-02-09 ENCOUNTER — Encounter (HOSPITAL_COMMUNITY): Payer: Self-pay

## 2017-02-09 ENCOUNTER — Encounter (HOSPITAL_COMMUNITY)
Admission: RE | Admit: 2017-02-09 | Discharge: 2017-02-09 | Disposition: A | Discharge status: Home / Self Care | Payer: Medicare Other | Source: Ambulatory Visit | Attending: Nephrology | Admitting: Nephrology

## 2017-02-09 DIAGNOSIS — N184 Chronic kidney disease, stage 4 (severe): Secondary | ICD-10-CM | POA: Insufficient documentation

## 2017-02-09 DIAGNOSIS — D509 Iron deficiency anemia, unspecified: Secondary | ICD-10-CM | POA: Diagnosis not present

## 2017-02-09 LAB — POCT HEMOGLOBIN-HEMACUE: Hemoglobin: 9.2 g/dL — ABNORMAL LOW (ref 12.0–15.0)

## 2017-02-09 MED ORDER — EPOETIN ALFA 4000 UNIT/ML IJ SOLN
4000.0000 [IU] | Freq: Once | INTRAMUSCULAR | Status: AC
  Administered 2017-02-09: 4000 [IU] via SUBCUTANEOUS

## 2017-02-09 MED ORDER — EPOETIN ALFA 4000 UNIT/ML IJ SOLN
INTRAMUSCULAR | Status: AC
  Filled 2017-02-09: qty 1

## 2017-02-23 ENCOUNTER — Encounter (HOSPITAL_COMMUNITY)
Admission: RE | Admit: 2017-02-23 | Discharge: 2017-02-23 | Disposition: A | Discharge status: Home / Self Care | Payer: Medicare Other | Source: Ambulatory Visit | Attending: Nephrology | Admitting: Nephrology

## 2017-02-23 DIAGNOSIS — N184 Chronic kidney disease, stage 4 (severe): Secondary | ICD-10-CM | POA: Diagnosis not present

## 2017-02-23 MED ORDER — EPOETIN ALFA 4000 UNIT/ML IJ SOLN
4000.0000 [IU] | Freq: Once | INTRAMUSCULAR | Status: AC
  Administered 2017-02-23: 4000 [IU] via SUBCUTANEOUS

## 2017-02-24 LAB — POCT HEMOGLOBIN-HEMACUE: HEMOGLOBIN: 9.8 g/dL — AB (ref 12.0–15.0)

## 2017-03-09 ENCOUNTER — Encounter (HOSPITAL_COMMUNITY)
Admission: RE | Admit: 2017-03-09 | Discharge: 2017-03-09 | Disposition: A | Discharge status: Home / Self Care | Payer: Medicare Other | Source: Ambulatory Visit | Attending: Nephrology | Admitting: Nephrology

## 2017-03-09 ENCOUNTER — Encounter (HOSPITAL_COMMUNITY): Payer: Self-pay

## 2017-03-09 DIAGNOSIS — D509 Iron deficiency anemia, unspecified: Secondary | ICD-10-CM | POA: Diagnosis present

## 2017-03-09 DIAGNOSIS — N184 Chronic kidney disease, stage 4 (severe): Secondary | ICD-10-CM | POA: Diagnosis not present

## 2017-03-09 LAB — POCT HEMOGLOBIN-HEMACUE: HEMOGLOBIN: 9.7 g/dL — AB (ref 12.0–15.0)

## 2017-03-09 MED ORDER — EPOETIN ALFA 4000 UNIT/ML IJ SOLN
4000.0000 [IU] | Freq: Once | INTRAMUSCULAR | Status: AC
  Administered 2017-03-09: 4000 [IU] via SUBCUTANEOUS

## 2017-03-09 MED ORDER — EPOETIN ALFA 4000 UNIT/ML IJ SOLN
INTRAMUSCULAR | Status: AC
  Filled 2017-03-09: qty 1

## 2017-03-09 NOTE — Progress Notes (Signed)
Results for Stephanie Middleton, Stephanie Middleton (MRN [id number]) as of 03/09/2017 13:30  Ref. Range 03/09/2017 10:18  Hemoglobin Latest Ref Range: 12.0 - 15.0 g/dL 9.7 (L)

## 2017-03-23 ENCOUNTER — Ambulatory Visit (HOSPITAL_COMMUNITY): Payer: Self-pay

## 2017-03-23 ENCOUNTER — Other Ambulatory Visit (HOSPITAL_COMMUNITY): Payer: Self-pay

## 2017-03-30 ENCOUNTER — Encounter (HOSPITAL_COMMUNITY)
Admission: RE | Admit: 2017-03-30 | Discharge: 2017-03-30 | Disposition: A | Discharge status: Home / Self Care | Payer: Medicare Other | Source: Ambulatory Visit | Attending: Nephrology | Admitting: Nephrology

## 2017-03-30 ENCOUNTER — Encounter (HOSPITAL_COMMUNITY): Payer: Self-pay

## 2017-03-30 DIAGNOSIS — N184 Chronic kidney disease, stage 4 (severe): Secondary | ICD-10-CM | POA: Diagnosis not present

## 2017-03-30 LAB — POCT HEMOGLOBIN-HEMACUE: HEMOGLOBIN: 9.6 g/dL — AB (ref 12.0–15.0)

## 2017-03-30 MED ORDER — EPOETIN ALFA 4000 UNIT/ML IJ SOLN
4000.0000 [IU] | Freq: Once | INTRAMUSCULAR | Status: AC
  Administered 2017-03-30: 4000 [IU] via SUBCUTANEOUS

## 2017-03-30 MED ORDER — EPOETIN ALFA 4000 UNIT/ML IJ SOLN
INTRAMUSCULAR | Status: AC
  Filled 2017-03-30: qty 1

## 2017-03-30 NOTE — Progress Notes (Signed)
Results for SHIRA, BOBST (MRN [id number]) as of 03/30/2017 10:45  Procrit 4000 units given as indicated.   Ref. Range 03/30/2017 10:04  Hemoglobin Latest Ref Range: 12.0 - 15.0 g/dL 9.6 (L)

## 2017-04-13 MED ORDER — EPOETIN ALFA 4000 UNIT/ML IJ SOLN: 4000.0000 [IU] | Freq: Once | INTRAMUSCULAR | Status: DC

## 2017-04-14 ENCOUNTER — Encounter (HOSPITAL_COMMUNITY)
Admission: RE | Admit: 2017-04-14 | Discharge: 2017-04-14 | Disposition: A | Discharge status: Home / Self Care | Payer: Medicare Other | Source: Ambulatory Visit | Attending: Nephrology | Admitting: Nephrology

## 2017-04-14 DIAGNOSIS — D509 Iron deficiency anemia, unspecified: Secondary | ICD-10-CM | POA: Insufficient documentation

## 2017-04-14 DIAGNOSIS — N184 Chronic kidney disease, stage 4 (severe): Secondary | ICD-10-CM | POA: Diagnosis not present

## 2017-04-14 LAB — POCT HEMOGLOBIN-HEMACUE: Hemoglobin: 9.4 g/dL — ABNORMAL LOW (ref 12.0–15.0)

## 2017-04-14 MED ORDER — EPOETIN ALFA 4000 UNIT/ML IJ SOLN
4000.0000 [IU] | INTRAMUSCULAR | Status: DC
  Administered 2017-04-14: 4000 [IU] via SUBCUTANEOUS

## 2017-04-14 MED ORDER — EPOETIN ALFA 3000 UNIT/ML IJ SOLN
4000.0000 [IU] | INTRAMUSCULAR | Status: DC
  Filled 2017-04-14: qty 2

## 2017-04-14 NOTE — Progress Notes (Signed)
Results for JESS, SULAK (MRN [id number]) as of 04/14/2017 10:48  Ref. Range 04/14/2017 10:05  Hemoglobin Latest Ref Range: 12.0 - 15.0 g/dL 9.4 (L)   Procrit 4000 units SQ given per MD order. Next appointment is scheduled for 04/28/17 @ 1200. Ms. Frame informed me that she has been going to another lab for additional bloodwork. If Dr. Lowanda Foster would like for [place] to do that at the same time we do her hemoglobin please send the orders and we would be able to .

## 2017-04-15 NOTE — Progress Notes (Signed)
Results for Stephanie Middleton, Stephanie Middleton (MRN [id number]) as of 04/15/2017 09:46   Ref. Range 04/14/2017 10:05  Hemoglobin Latest Ref Range: 12.0 - 15.0 g/dL 9.4 (L)

## 2017-04-28 ENCOUNTER — Encounter (HOSPITAL_COMMUNITY)
Admission: RE | Admit: 2017-04-28 | Discharge: 2017-04-28 | Disposition: A | Discharge status: Home / Self Care | Payer: Medicare Other | Source: Ambulatory Visit | Attending: Nephrology | Admitting: Nephrology

## 2017-04-28 DIAGNOSIS — N184 Chronic kidney disease, stage 4 (severe): Secondary | ICD-10-CM | POA: Diagnosis not present

## 2017-04-28 LAB — POCT HEMOGLOBIN-HEMACUE: HEMOGLOBIN: 9.7 g/dL — AB (ref 12.0–15.0)

## 2017-04-28 MED ORDER — EPOETIN ALFA 4000 UNIT/ML IJ SOLN
INTRAMUSCULAR | Status: AC
  Filled 2017-04-28: qty 1

## 2017-04-28 MED ORDER — EPOETIN ALFA 4000 UNIT/ML IJ SOLN
4000.0000 [IU] | Freq: Once | INTRAMUSCULAR | Status: AC
  Administered 2017-04-28: 4000 [IU] via SUBCUTANEOUS

## 2017-04-28 NOTE — Progress Notes (Signed)
Results for Stephanie Middleton, Stephanie Middleton (MRN [id number]) as of 04/28/2017 16:37  Ref. Range 04/28/2017 12:08  Hemoglobin Latest Ref Range: 12.0 - 15.0 g/dL 9.7 (L)

## 2017-05-12 ENCOUNTER — Encounter (HOSPITAL_COMMUNITY)
Admission: RE | Admit: 2017-05-12 | Discharge: 2017-05-12 | Disposition: A | Discharge status: Home / Self Care | Payer: Medicare Other | Source: Ambulatory Visit | Attending: Nephrology | Admitting: Nephrology

## 2017-05-12 DIAGNOSIS — D509 Iron deficiency anemia, unspecified: Secondary | ICD-10-CM | POA: Insufficient documentation

## 2017-05-12 DIAGNOSIS — N184 Chronic kidney disease, stage 4 (severe): Secondary | ICD-10-CM | POA: Diagnosis not present

## 2017-05-12 LAB — POCT HEMOGLOBIN-HEMACUE: Hemoglobin: 9.1 g/dL — ABNORMAL LOW (ref 12.0–15.0)

## 2017-05-12 MED ORDER — EPOETIN ALFA 4000 UNIT/ML IJ SOLN
4000.0000 [IU] | Freq: Once | INTRAMUSCULAR | Status: AC
  Administered 2017-05-12: 4000 [IU] via SUBCUTANEOUS

## 2017-05-12 MED ORDER — EPOETIN ALFA 10000 UNIT/ML IJ SOLN: 4000.0000 [IU] | Freq: Once | INTRAMUSCULAR | Status: DC

## 2017-05-12 MED ORDER — EPOETIN ALFA 4000 UNIT/ML IJ SOLN
INTRAMUSCULAR | Status: AC
  Filled 2017-05-12: qty 1

## 2017-05-12 MED ORDER — EPOETIN ALFA 10000 UNIT/ML IJ SOLN: 4000.0000 [IU] | INTRAMUSCULAR | Status: DC

## 2017-05-12 NOTE — Progress Notes (Signed)
Results for Stephanie Middleton, Stephanie Middleton (MRN [id number]) as of 05/12/2017 10:33  Ref. Range 05/12/2017 10:08  Hemoglobin Latest Ref Range: 12.0 - 15.0 g/dL 9.1 (L)

## 2017-05-25 ENCOUNTER — Encounter (HOSPITAL_COMMUNITY)
Admission: RE | Admit: 2017-05-25 | Discharge: 2017-05-25 | Disposition: A | Discharge status: Home / Self Care | Payer: Medicare Other | Source: Ambulatory Visit | Attending: Nephrology | Admitting: Nephrology

## 2017-05-25 ENCOUNTER — Encounter (HOSPITAL_COMMUNITY): Payer: Self-pay

## 2017-05-25 DIAGNOSIS — N184 Chronic kidney disease, stage 4 (severe): Secondary | ICD-10-CM | POA: Diagnosis not present

## 2017-05-25 LAB — POCT HEMOGLOBIN-HEMACUE: HEMOGLOBIN: 9.4 g/dL — AB (ref 12.0–15.0)

## 2017-05-25 MED ORDER — EPOETIN ALFA 4000 UNIT/ML IJ SOLN
4000.0000 [IU] | Freq: Once | INTRAMUSCULAR | Status: AC
  Administered 2017-05-25: 4000 [IU] via SUBCUTANEOUS
  Filled 2017-05-25: qty 1

## 2017-05-25 NOTE — Progress Notes (Signed)
Results for Stephanie Middleton, Stephanie Middleton (MRN [id number]) as of 05/25/2017 11:39  Ref. Range 05/25/2017 10:47  Hemoglobin Latest Ref Range: 12.0 - 15.0 g/dL 9.4 (L)

## 2017-06-08 ENCOUNTER — Encounter (HOSPITAL_COMMUNITY)
Admission: RE | Admit: 2017-06-08 | Discharge: 2017-06-08 | Disposition: A | Discharge status: Home / Self Care | Payer: Medicare Other | Source: Ambulatory Visit | Attending: Nephrology | Admitting: Nephrology

## 2017-06-08 DIAGNOSIS — D509 Iron deficiency anemia, unspecified: Secondary | ICD-10-CM | POA: Diagnosis present

## 2017-06-08 DIAGNOSIS — N184 Chronic kidney disease, stage 4 (severe): Secondary | ICD-10-CM | POA: Diagnosis not present

## 2017-06-08 LAB — HEMOGLOBIN AND HEMATOCRIT, BLOOD
HEMATOCRIT: 29.2 % — AB (ref 36.0–46.0)
HEMOGLOBIN: 9.5 g/dL — AB (ref 12.0–15.0)

## 2017-06-08 MED ORDER — EPOETIN ALFA 4000 UNIT/ML IJ SOLN
INTRAMUSCULAR | Status: AC
  Filled 2017-06-08: qty 1

## 2017-06-08 MED ORDER — EPOETIN ALFA 4000 UNIT/ML IJ SOLN
4000.0000 [IU] | Freq: Once | INTRAMUSCULAR | Status: AC
  Administered 2017-06-08: 4000 [IU] via SUBCUTANEOUS

## 2017-06-08 NOTE — Progress Notes (Signed)
Results for Stephanie Middleton, Stephanie Middleton (MRN [id number]) as of 06/08/2017 12:20  Ref. Range 06/08/2017 12:01  Hemoglobin Latest Ref Range: 12.0 - 15.0 g/dL 9.5 (L)  HCT Latest Ref Range: 36.0 - 46.0 % 29.2 (L)    Procrit 4000 units SQ given per MD order. Next appt. 06-22-17.

## 2017-06-22 ENCOUNTER — Encounter (HOSPITAL_COMMUNITY)
Admission: RE | Admit: 2017-06-22 | Discharge: 2017-06-22 | Disposition: A | Discharge status: Home / Self Care | Payer: Medicare Other | Source: Ambulatory Visit | Attending: Nephrology | Admitting: Nephrology

## 2017-06-22 DIAGNOSIS — N184 Chronic kidney disease, stage 4 (severe): Secondary | ICD-10-CM | POA: Diagnosis not present

## 2017-06-22 LAB — POCT HEMOGLOBIN-HEMACUE: HEMOGLOBIN: 9.4 g/dL — AB (ref 12.0–15.0)

## 2017-06-22 MED ORDER — EPOETIN ALFA 4000 UNIT/ML IJ SOLN
INTRAMUSCULAR | Status: AC
  Filled 2017-06-22: qty 1

## 2017-06-22 MED ORDER — EPOETIN ALFA 4000 UNIT/ML IJ SOLN
4000.0000 [IU] | Freq: Once | INTRAMUSCULAR | Status: AC
  Administered 2017-06-22: 4000 [IU] via SUBCUTANEOUS

## 2017-06-22 NOTE — Progress Notes (Signed)
Results for Stephanie Middleton, Stephanie Middleton (MRN [id number]) as of 06/22/2017 10:53  Ref. Range 06/22/2017 10:25  Hemoglobin Latest Ref Range: 12.0 - 15.0 g/dL 9.4 (L)

## 2017-07-06 ENCOUNTER — Encounter (HOSPITAL_COMMUNITY)
Admission: RE | Admit: 2017-07-06 | Discharge: 2017-07-06 | Disposition: A | Discharge status: Home / Self Care | Payer: Medicare Other | Source: Ambulatory Visit | Attending: Nephrology | Admitting: Nephrology

## 2017-07-06 DIAGNOSIS — Z5181 Encounter for therapeutic drug level monitoring: Secondary | ICD-10-CM | POA: Insufficient documentation

## 2017-07-06 DIAGNOSIS — D631 Anemia in chronic kidney disease: Secondary | ICD-10-CM | POA: Diagnosis not present

## 2017-07-06 DIAGNOSIS — N184 Chronic kidney disease, stage 4 (severe): Secondary | ICD-10-CM | POA: Diagnosis not present

## 2017-07-06 DIAGNOSIS — Z79899 Other long term (current) drug therapy: Secondary | ICD-10-CM | POA: Diagnosis not present

## 2017-07-06 LAB — POCT HEMOGLOBIN-HEMACUE: HEMOGLOBIN: 9.1 g/dL — AB (ref 12.0–15.0)

## 2017-07-06 MED ORDER — EPOETIN ALFA 4000 UNIT/ML IJ SOLN
INTRAMUSCULAR | Status: AC
  Filled 2017-07-06: qty 1

## 2017-07-06 MED ORDER — EPOETIN ALFA 4000 UNIT/ML IJ SOLN
4000.0000 [IU] | Freq: Once | INTRAMUSCULAR | Status: AC
  Administered 2017-07-06: 4000 [IU] via SUBCUTANEOUS

## 2017-07-06 NOTE — Progress Notes (Signed)
Results for Stephanie Middleton, Stephanie Middleton (MRN [id number]) as of 07/06/2017 15:19  Ref. Range 07/06/2017 10:44  Hemoglobin Latest Ref Range: 12.0 - 15.0 g/dL 9.1 (L)

## 2017-07-20 ENCOUNTER — Encounter (HOSPITAL_COMMUNITY)
Admission: RE | Admit: 2017-07-20 | Discharge: 2017-07-20 | Disposition: A | Discharge status: Home / Self Care | Payer: Medicare Other | Source: Ambulatory Visit | Attending: Nephrology | Admitting: Nephrology

## 2017-07-20 DIAGNOSIS — N184 Chronic kidney disease, stage 4 (severe): Secondary | ICD-10-CM | POA: Diagnosis not present

## 2017-07-20 LAB — POCT HEMOGLOBIN-HEMACUE: Hemoglobin: 9 g/dL — ABNORMAL LOW (ref 12.0–15.0)

## 2017-07-20 MED ORDER — EPOETIN ALFA 4000 UNIT/ML IJ SOLN
INTRAMUSCULAR | Status: AC
  Filled 2017-07-20: qty 1

## 2017-07-20 MED ORDER — EPOETIN ALFA 4000 UNIT/ML IJ SOLN
4000.0000 [IU] | Freq: Once | INTRAMUSCULAR | Status: AC
  Administered 2017-07-20: 4000 [IU] via SUBCUTANEOUS

## 2017-07-20 NOTE — Progress Notes (Signed)
Results for MIKERIA, VALIN (MRN [id number]) as of 07/20/2017 11:07  Ref. Range 07/20/2017 09:58  Hemoglobin Latest Ref Range: 12.0 - 15.0 g/dL 9.0 (L)

## 2017-08-03 ENCOUNTER — Encounter (HOSPITAL_COMMUNITY)
Admission: RE | Admit: 2017-08-03 | Discharge: 2017-08-03 | Disposition: A | Discharge status: Home / Self Care | Payer: Medicare Other | Source: Ambulatory Visit | Attending: Nephrology | Admitting: Nephrology

## 2017-08-03 ENCOUNTER — Encounter (HOSPITAL_COMMUNITY): Payer: Self-pay

## 2017-08-03 DIAGNOSIS — N184 Chronic kidney disease, stage 4 (severe): Secondary | ICD-10-CM | POA: Diagnosis not present

## 2017-08-03 LAB — POCT HEMOGLOBIN-HEMACUE: Hemoglobin: 7.5 g/dL — ABNORMAL LOW (ref 12.0–15.0)

## 2017-08-03 MED ORDER — EPOETIN ALFA 4000 UNIT/ML IJ SOLN
INTRAMUSCULAR | Status: AC
  Filled 2017-08-03: qty 1

## 2017-08-03 MED ORDER — EPOETIN ALFA 4000 UNIT/ML IJ SOLN
4000.0000 [IU] | Freq: Once | INTRAMUSCULAR | Status: AC
  Administered 2017-08-03: 4000 [IU] via SUBCUTANEOUS

## 2017-08-03 NOTE — Progress Notes (Signed)
Results for Stephanie Middleton, Stephanie Middleton (MRN [id number]) as of 08/03/2017 10:28  Procrit 4000 units given as indicated. Next appointment 08/17/2017 @ 1015   Ref. Range 08/03/2017 10:04  Hemoglobin Latest Ref Range: 12.0 - 15.0 g/dL 7.5 (L)

## 2017-08-17 ENCOUNTER — Encounter (HOSPITAL_COMMUNITY)
Admission: RE | Admit: 2017-08-17 | Discharge: 2017-08-17 | Disposition: A | Discharge status: Home / Self Care | Payer: Medicare Other | Source: Ambulatory Visit | Attending: Nephrology | Admitting: Nephrology

## 2017-08-17 DIAGNOSIS — N184 Chronic kidney disease, stage 4 (severe): Secondary | ICD-10-CM | POA: Diagnosis not present

## 2017-08-17 DIAGNOSIS — Z5181 Encounter for therapeutic drug level monitoring: Secondary | ICD-10-CM | POA: Insufficient documentation

## 2017-08-17 DIAGNOSIS — D631 Anemia in chronic kidney disease: Secondary | ICD-10-CM | POA: Diagnosis not present

## 2017-08-17 DIAGNOSIS — Z79899 Other long term (current) drug therapy: Secondary | ICD-10-CM | POA: Insufficient documentation

## 2017-08-17 LAB — POCT HEMOGLOBIN-HEMACUE: HEMOGLOBIN: 10 g/dL — AB (ref 12.0–15.0)

## 2017-08-17 NOTE — Progress Notes (Signed)
Results for Stephanie Middleton, Stephanie Middleton (MRN [id number]) as of 08/17/2017 12:20  Ref. Range 08/17/2017 10:28  Hemoglobin Latest Ref Range: 12.0 - 15.0 g/dL 10.0 (L)

## 2017-08-17 NOTE — Progress Notes (Signed)
Results for ALIXANDRIA, FRIEDT (MRN [id number]) as of 08/17/2017 12:20  Ref. Range 08/17/2017 10:28  Hemoglobin Latest Ref Range: 12.0 - 15.0 g/dL 10.0 (L)  Results for SUNDAE, MANERS (MRN [id number]) as of 08/17/2017 12:20  Ref. Range 08/17/2017 10:28  Hemoglobin Latest Ref Range: 12.0 - 15.0 g/dL 10.0 (L)

## 2017-08-31 ENCOUNTER — Encounter (HOSPITAL_COMMUNITY)
Admission: RE | Admit: 2017-08-31 | Discharge: 2017-08-31 | Disposition: A | Discharge status: Home / Self Care | Payer: Medicare Other | Source: Ambulatory Visit | Attending: Nephrology | Admitting: Nephrology

## 2017-08-31 ENCOUNTER — Encounter (HOSPITAL_COMMUNITY): Payer: Self-pay

## 2017-08-31 DIAGNOSIS — N184 Chronic kidney disease, stage 4 (severe): Secondary | ICD-10-CM | POA: Diagnosis not present

## 2017-08-31 LAB — POCT HEMOGLOBIN-HEMACUE: HEMOGLOBIN: 9.6 g/dL — AB (ref 12.0–15.0)

## 2017-08-31 MED ORDER — EPOETIN ALFA 4000 UNIT/ML IJ SOLN
INTRAMUSCULAR | Status: AC
  Filled 2017-08-31: qty 1

## 2017-08-31 MED ORDER — EPOETIN ALFA 4000 UNIT/ML IJ SOLN
4000.0000 [IU] | Freq: Once | INTRAMUSCULAR | Status: AC
  Administered 2017-08-31: 4000 [IU] via SUBCUTANEOUS

## 2017-08-31 NOTE — Progress Notes (Signed)
Results for ENSLIE, SAHOTA (MRN [id number]) as of 08/31/2017 11:07  Ref. Range 08/31/2017 10:57  Hemoglobin Latest Ref Range: 12.0 - 15.0 g/dL 9.6 (L)

## 2017-09-14 ENCOUNTER — Encounter (HOSPITAL_COMMUNITY): Payer: Medicare Other

## 2017-09-14 ENCOUNTER — Encounter (HOSPITAL_COMMUNITY)
Admission: RE | Admit: 2017-09-14 | Discharge: 2017-09-14 | Disposition: A | Discharge status: Home / Self Care | Payer: Medicare Other | Source: Ambulatory Visit | Attending: Nephrology | Admitting: Nephrology

## 2017-09-14 DIAGNOSIS — D631 Anemia in chronic kidney disease: Secondary | ICD-10-CM | POA: Insufficient documentation

## 2017-09-14 DIAGNOSIS — Z79899 Other long term (current) drug therapy: Secondary | ICD-10-CM | POA: Insufficient documentation

## 2017-09-14 DIAGNOSIS — Z5181 Encounter for therapeutic drug level monitoring: Secondary | ICD-10-CM | POA: Insufficient documentation

## 2017-09-14 DIAGNOSIS — N184 Chronic kidney disease, stage 4 (severe): Secondary | ICD-10-CM | POA: Insufficient documentation

## 2017-09-21 ENCOUNTER — Encounter (HOSPITAL_COMMUNITY)
Admission: RE | Admit: 2017-09-21 | Discharge: 2017-09-21 | Disposition: A | Discharge status: Home / Self Care | Payer: Medicare Other | Source: Ambulatory Visit | Attending: Nephrology | Admitting: Nephrology

## 2017-09-21 ENCOUNTER — Encounter (HOSPITAL_COMMUNITY): Payer: Self-pay

## 2017-09-21 DIAGNOSIS — D631 Anemia in chronic kidney disease: Secondary | ICD-10-CM | POA: Diagnosis not present

## 2017-09-21 DIAGNOSIS — N184 Chronic kidney disease, stage 4 (severe): Secondary | ICD-10-CM | POA: Diagnosis not present

## 2017-09-21 LAB — POCT HEMOGLOBIN-HEMACUE: Hemoglobin: 9.6 g/dL — ABNORMAL LOW (ref 12.0–15.0)

## 2017-09-21 MED ORDER — EPOETIN ALFA 4000 UNIT/ML IJ SOLN
4000.0000 [IU] | Freq: Once | INTRAMUSCULAR | Status: AC
  Administered 2017-09-21: 4000 [IU] via SUBCUTANEOUS

## 2017-09-21 MED ORDER — EPOETIN ALFA 4000 UNIT/ML IJ SOLN
INTRAMUSCULAR | Status: AC
  Filled 2017-09-21: qty 1

## 2017-09-21 NOTE — Progress Notes (Signed)
Results for Stephanie Middleton, Stephanie Middleton (MRN [id number]) as of 09/21/2017 10:08  Ref. Range 09/21/2017 09:17  Hemoglobin Latest Ref Range: 12.0 - 15.0 g/dL 9.6 (L)

## 2017-10-06 ENCOUNTER — Encounter (HOSPITAL_COMMUNITY)
Admission: RE | Admit: 2017-10-06 | Discharge: 2017-10-06 | Disposition: A | Discharge status: Home / Self Care | Payer: Medicare Other | Source: Ambulatory Visit | Attending: Nephrology | Admitting: Nephrology

## 2017-10-06 DIAGNOSIS — N184 Chronic kidney disease, stage 4 (severe): Secondary | ICD-10-CM | POA: Insufficient documentation

## 2017-10-06 DIAGNOSIS — D631 Anemia in chronic kidney disease: Secondary | ICD-10-CM | POA: Diagnosis present

## 2017-10-06 LAB — POCT HEMOGLOBIN-HEMACUE: HEMOGLOBIN: 9.2 g/dL — AB (ref 12.0–15.0)

## 2017-10-06 MED ORDER — EPOETIN ALFA 4000 UNIT/ML IJ SOLN
4000.0000 [IU] | Freq: Once | INTRAMUSCULAR | Status: AC
  Administered 2017-10-06: 4000 [IU] via SUBCUTANEOUS

## 2017-10-06 MED ORDER — EPOETIN ALFA 4000 UNIT/ML IJ SOLN
INTRAMUSCULAR | Status: AC
  Filled 2017-10-06: qty 1

## 2017-10-20 ENCOUNTER — Encounter (HOSPITAL_COMMUNITY)
Admission: RE | Admit: 2017-10-20 | Discharge: 2017-10-20 | Disposition: A | Discharge status: Home / Self Care | Payer: Medicare Other | Source: Ambulatory Visit | Attending: Nephrology | Admitting: Nephrology

## 2017-10-20 DIAGNOSIS — N184 Chronic kidney disease, stage 4 (severe): Secondary | ICD-10-CM | POA: Diagnosis not present

## 2017-10-20 LAB — POCT HEMOGLOBIN-HEMACUE: Hemoglobin: 9.2 g/dL — ABNORMAL LOW (ref 12.0–15.0)

## 2017-10-20 MED ORDER — EPOETIN ALFA 4000 UNIT/ML IJ SOLN
4000.0000 [IU] | INTRAMUSCULAR | Status: DC
  Administered 2017-10-20: 4000 [IU] via SUBCUTANEOUS

## 2017-10-20 NOTE — Progress Notes (Signed)
Results for [name], [name] (MRN [id number]) as of 10/20/2017 13:17  Ref. Range 10/20/2017 12:03  Hemoglobin Latest Ref Range: 12.0 - 15.0 g/dL 9.2 (L)

## 2017-11-03 ENCOUNTER — Encounter (HOSPITAL_COMMUNITY)
Admission: RE | Admit: 2017-11-03 | Discharge: 2017-11-03 | Disposition: A | Discharge status: Home / Self Care | Payer: Medicare Other | Source: Ambulatory Visit | Attending: Nephrology | Admitting: Nephrology

## 2017-11-03 DIAGNOSIS — N184 Chronic kidney disease, stage 4 (severe): Secondary | ICD-10-CM | POA: Diagnosis not present

## 2017-11-03 LAB — POCT HEMOGLOBIN-HEMACUE: HEMOGLOBIN: 9.1 g/dL — AB (ref 12.0–15.0)

## 2017-11-03 MED ORDER — EPOETIN ALFA 4000 UNIT/ML IJ SOLN: 4000.0000 [IU] | Freq: Once | INTRAMUSCULAR | Status: DC

## 2017-11-03 MED ORDER — EPOETIN ALFA 2000 UNIT/ML IJ SOLN
INTRAMUSCULAR | Status: AC
  Filled 2017-11-03: qty 2

## 2017-11-03 MED ORDER — EPOETIN ALFA 2000 UNIT/ML IJ SOLN
2000.0000 [IU] | Freq: Once | INTRAMUSCULAR | Status: AC
  Administered 2017-11-03: 2000 [IU] via SUBCUTANEOUS

## 2017-11-03 NOTE — Progress Notes (Signed)
Results for Stephanie Middleton, Stephanie Middleton (MRN [id number]) as of 11/03/2017 13:51  Ref. Range 11/03/2017 12:12  Hemoglobin Latest Ref Range: 12.0 - 15.0 g/dL 9.1 (L)

## 2017-11-17 ENCOUNTER — Encounter (HOSPITAL_COMMUNITY)
Admission: RE | Admit: 2017-11-17 | Discharge: 2017-11-17 | Disposition: A | Discharge status: Home / Self Care | Payer: Medicare Other | Source: Ambulatory Visit | Attending: Nephrology | Admitting: Nephrology

## 2017-11-17 ENCOUNTER — Other Ambulatory Visit (HOSPITAL_COMMUNITY): Payer: Self-pay

## 2017-11-17 DIAGNOSIS — N184 Chronic kidney disease, stage 4 (severe): Secondary | ICD-10-CM | POA: Insufficient documentation

## 2017-11-17 DIAGNOSIS — D631 Anemia in chronic kidney disease: Secondary | ICD-10-CM | POA: Diagnosis present

## 2017-11-17 LAB — POCT HEMOGLOBIN-HEMACUE: Hemoglobin: 9.4 g/dL — ABNORMAL LOW (ref 12.0–15.0)

## 2017-11-17 MED ORDER — EPOETIN ALFA 4000 UNIT/ML IJ SOLN
INTRAMUSCULAR | Status: AC
  Filled 2017-11-17: qty 1

## 2017-11-17 MED ORDER — EPOETIN ALFA 4000 UNIT/ML IJ SOLN
4000.0000 [IU] | Freq: Once | INTRAMUSCULAR | Status: AC
  Administered 2017-11-17: 4000 [IU] via SUBCUTANEOUS

## 2017-11-19 NOTE — Progress Notes (Signed)
Results for Stephanie Middleton, Stephanie Middleton (MRN [id number]) as of 11/19/2017 12:11  Ref. Range 11/17/2017 08:40  Hemoglobin Latest Ref Range: 12.0 - 15.0 g/dL 9.4 (L)

## 2017-12-01 ENCOUNTER — Encounter (HOSPITAL_COMMUNITY)
Admission: RE | Admit: 2017-12-01 | Discharge: 2017-12-01 | Disposition: A | Discharge status: Home / Self Care | Payer: Medicare Other | Source: Ambulatory Visit | Attending: Nephrology | Admitting: Nephrology

## 2017-12-01 DIAGNOSIS — N184 Chronic kidney disease, stage 4 (severe): Secondary | ICD-10-CM | POA: Diagnosis not present

## 2017-12-01 LAB — POCT HEMOGLOBIN-HEMACUE: Hemoglobin: 9.8 g/dL — ABNORMAL LOW (ref 12.0–15.0)

## 2017-12-01 MED ORDER — EPOETIN ALFA 4000 UNIT/ML IJ SOLN
4000.0000 [IU] | INTRAMUSCULAR | Status: DC
  Administered 2017-12-01: 4000 [IU] via SUBCUTANEOUS
  Filled 2017-12-01: qty 1

## 2017-12-01 NOTE — Progress Notes (Signed)
Results for ESCARLET, SAATHOFF (MRN [id number]) as of 12/01/2017 12:45  Ref. Range 12/01/2017 12:29  Hemoglobin Latest Ref Range: 12.0 - 15.0 g/dL 9.8 (L)   Procrit 4000 units SQ given per MD order. Next  Appointment on 12/15/17

## 2017-12-15 ENCOUNTER — Encounter (HOSPITAL_COMMUNITY)
Admission: RE | Admit: 2017-12-15 | Discharge: 2017-12-15 | Disposition: A | Discharge status: Home / Self Care | Payer: Medicare Other | Source: Ambulatory Visit | Attending: Nephrology | Admitting: Nephrology

## 2017-12-15 DIAGNOSIS — N184 Chronic kidney disease, stage 4 (severe): Secondary | ICD-10-CM | POA: Insufficient documentation

## 2017-12-15 DIAGNOSIS — D631 Anemia in chronic kidney disease: Secondary | ICD-10-CM | POA: Insufficient documentation

## 2017-12-15 LAB — POCT HEMOGLOBIN-HEMACUE: Hemoglobin: 9 g/dL — ABNORMAL LOW (ref 12.0–15.0)

## 2017-12-15 MED ORDER — EPOETIN ALFA 4000 UNIT/ML IJ SOLN
INTRAMUSCULAR | Status: AC
  Filled 2017-12-15: qty 1

## 2017-12-15 MED ORDER — EPOETIN ALFA 4000 UNIT/ML IJ SOLN
4000.0000 [IU] | Freq: Once | INTRAMUSCULAR | Status: AC
  Administered 2017-12-15: 4000 [IU] via SUBCUTANEOUS

## 2017-12-29 ENCOUNTER — Encounter (HOSPITAL_COMMUNITY)
Admission: RE | Admit: 2017-12-29 | Discharge: 2017-12-29 | Disposition: A | Discharge status: Home / Self Care | Payer: Medicare Other | Source: Ambulatory Visit | Attending: Nephrology | Admitting: Nephrology

## 2017-12-29 DIAGNOSIS — N184 Chronic kidney disease, stage 4 (severe): Secondary | ICD-10-CM | POA: Diagnosis not present

## 2017-12-29 LAB — POCT HEMOGLOBIN-HEMACUE: Hemoglobin: 9.5 g/dL — ABNORMAL LOW (ref 12.0–15.0)

## 2017-12-29 MED ORDER — EPOETIN ALFA 2000 UNIT/ML IJ SOLN
2000.0000 [IU] | Freq: Once | INTRAMUSCULAR | Status: AC
  Administered 2017-12-29: 2000 [IU] via SUBCUTANEOUS

## 2017-12-29 MED ORDER — EPOETIN ALFA 4000 UNIT/ML IJ SOLN: 4000.0000 [IU] | Freq: Once | INTRAMUSCULAR | Status: DC

## 2017-12-29 MED ORDER — EPOETIN ALFA 2000 UNIT/ML IJ SOLN
INTRAMUSCULAR | Status: AC
  Filled 2017-12-29: qty 2

## 2017-12-29 NOTE — Progress Notes (Signed)
Results for ACCALIA, RIGDON (MRN [id number]) as of 12/29/2017 12:50  Ref. Range 12/29/2017 12:07  Hemoglobin Latest Ref Range: 12.0 - 15.0 g/dL 9.5 (L)

## 2018-01-12 ENCOUNTER — Encounter (HOSPITAL_COMMUNITY)
Admission: RE | Admit: 2018-01-12 | Discharge: 2018-01-12 | Disposition: A | Discharge status: Home / Self Care | Payer: Medicare Other | Source: Ambulatory Visit | Attending: Nephrology | Admitting: Nephrology

## 2018-01-12 DIAGNOSIS — N184 Chronic kidney disease, stage 4 (severe): Secondary | ICD-10-CM | POA: Insufficient documentation

## 2018-01-12 DIAGNOSIS — D631 Anemia in chronic kidney disease: Secondary | ICD-10-CM | POA: Insufficient documentation

## 2018-01-12 LAB — POCT HEMOGLOBIN-HEMACUE: HEMOGLOBIN: 9.7 g/dL — AB (ref 12.0–15.0)

## 2018-01-12 MED ORDER — EPOETIN ALFA 4000 UNIT/ML IJ SOLN
INTRAMUSCULAR | Status: AC
  Filled 2018-01-12: qty 1

## 2018-01-12 MED ORDER — EPOETIN ALFA 4000 UNIT/ML IJ SOLN
4000.0000 [IU] | Freq: Once | INTRAMUSCULAR | Status: AC
  Administered 2018-01-12: 4000 [IU] via SUBCUTANEOUS

## 2018-01-12 NOTE — Progress Notes (Signed)
Results for Stephanie Middleton, Stephanie Middleton (MRN [id number]) as of 01/12/2018 10:35  Ref. Range 01/12/2018 08:42  Hemoglobin Latest Ref Range: 12.0 - 15.0 g/dL 9.7 (L)

## 2018-01-26 ENCOUNTER — Encounter (HOSPITAL_COMMUNITY)
Admission: RE | Admit: 2018-01-26 | Discharge: 2018-01-26 | Disposition: A | Discharge status: Home / Self Care | Payer: Medicare Other | Source: Ambulatory Visit | Attending: Nephrology | Admitting: Nephrology

## 2018-01-26 DIAGNOSIS — N184 Chronic kidney disease, stage 4 (severe): Secondary | ICD-10-CM | POA: Diagnosis not present

## 2018-01-26 LAB — POCT HEMOGLOBIN-HEMACUE: Hemoglobin: 9.4 g/dL — ABNORMAL LOW (ref 12.0–15.0)

## 2018-01-26 MED ORDER — EPOETIN ALFA 4000 UNIT/ML IJ SOLN
4000.0000 [IU] | Freq: Once | INTRAMUSCULAR | Status: AC
  Administered 2018-01-26: 4000 [IU] via SUBCUTANEOUS

## 2018-01-26 MED ORDER — EPOETIN ALFA 4000 UNIT/ML IJ SOLN
INTRAMUSCULAR | Status: AC
  Filled 2018-01-26: qty 1

## 2018-01-26 NOTE — Progress Notes (Signed)
Results for BRANNON, DECAIRE (MRN [id number]) as of 01/26/2018 16:34  Ref. Range 01/26/2018 11:01  Hemoglobin Latest Ref Range: 12.0 - 15.0 g/dL 9.4 (L)

## 2018-02-09 ENCOUNTER — Encounter (HOSPITAL_COMMUNITY)
Admission: RE | Admit: 2018-02-09 | Discharge: 2018-02-09 | Disposition: A | Discharge status: Home / Self Care | Payer: Medicare Other | Source: Ambulatory Visit | Attending: Nephrology | Admitting: Nephrology

## 2018-02-09 DIAGNOSIS — N184 Chronic kidney disease, stage 4 (severe): Secondary | ICD-10-CM | POA: Insufficient documentation

## 2018-02-09 DIAGNOSIS — D631 Anemia in chronic kidney disease: Secondary | ICD-10-CM | POA: Diagnosis present

## 2018-02-09 LAB — POCT HEMOGLOBIN-HEMACUE: HEMOGLOBIN: 9.9 g/dL — AB (ref 12.0–15.0)

## 2018-02-09 MED ORDER — EPOETIN ALFA 4000 UNIT/ML IJ SOLN
INTRAMUSCULAR | Status: AC
  Filled 2018-02-09: qty 1

## 2018-02-09 MED ORDER — EPOETIN ALFA 4000 UNIT/ML IJ SOLN
4000.0000 [IU] | Freq: Once | INTRAMUSCULAR | Status: AC
  Administered 2018-02-09: 4000 [IU] via SUBCUTANEOUS

## 2018-02-09 NOTE — Progress Notes (Signed)
Results for Stephanie Middleton, Stephanie Middleton (MRN [id number]) as of 02/09/2018 14:07  Ref. Range 02/09/2018 08:52  Hemoglobin Latest Ref Range: 12.0 - 15.0 g/dL 9.9 (L)

## 2018-02-23 ENCOUNTER — Encounter (HOSPITAL_COMMUNITY)
Admission: RE | Admit: 2018-02-23 | Discharge: 2018-02-23 | Disposition: A | Discharge status: Home / Self Care | Payer: Medicare Other | Source: Ambulatory Visit | Attending: Nephrology | Admitting: Nephrology

## 2018-02-23 ENCOUNTER — Encounter (HOSPITAL_COMMUNITY): Payer: Self-pay

## 2018-02-23 DIAGNOSIS — N184 Chronic kidney disease, stage 4 (severe): Secondary | ICD-10-CM | POA: Diagnosis not present

## 2018-02-23 LAB — POCT HEMOGLOBIN-HEMACUE: HEMOGLOBIN: 9.4 g/dL — AB (ref 12.0–15.0)

## 2018-02-23 MED ORDER — EPOETIN ALFA 4000 UNIT/ML IJ SOLN
INTRAMUSCULAR | Status: AC
  Filled 2018-02-23: qty 1

## 2018-02-23 MED ORDER — EPOETIN ALFA 4000 UNIT/ML IJ SOLN
4000.0000 [IU] | INTRAMUSCULAR | Status: DC
  Administered 2018-02-23: 4000 [IU] via SUBCUTANEOUS

## 2018-02-23 NOTE — Progress Notes (Signed)
Results for MARWA, FUHRMAN (MRN [id number]) as of 02/23/2018 10:00  Ref. Range 02/23/2018 09:51  Hemoglobin Latest Ref Range: 12.0 - 15.0 g/dL 9.4 (L)

## 2018-03-08 ENCOUNTER — Other Ambulatory Visit (HOSPITAL_COMMUNITY): Payer: Self-pay | Admitting: Family Medicine

## 2018-03-08 ENCOUNTER — Ambulatory Visit (HOSPITAL_COMMUNITY)
Admission: RE | Admit: 2018-03-08 | Discharge: 2018-03-08 | Disposition: A | Discharge status: Home / Self Care | Payer: Medicare Other | Source: Ambulatory Visit | Attending: Family Medicine | Admitting: Family Medicine

## 2018-03-08 DIAGNOSIS — G8929 Other chronic pain: Secondary | ICD-10-CM | POA: Diagnosis present

## 2018-03-08 DIAGNOSIS — M25561 Pain in right knee: Secondary | ICD-10-CM | POA: Diagnosis not present

## 2018-03-09 ENCOUNTER — Encounter (HOSPITAL_COMMUNITY)
Admission: RE | Admit: 2018-03-09 | Discharge: 2018-03-09 | Disposition: A | Discharge status: Home / Self Care | Payer: Medicare Other | Source: Ambulatory Visit | Attending: Nephrology | Admitting: Nephrology

## 2018-03-09 DIAGNOSIS — N184 Chronic kidney disease, stage 4 (severe): Secondary | ICD-10-CM | POA: Diagnosis not present

## 2018-03-09 DIAGNOSIS — D631 Anemia in chronic kidney disease: Secondary | ICD-10-CM | POA: Insufficient documentation

## 2018-03-09 LAB — HEMOGLOBIN AND HEMATOCRIT, BLOOD
HCT: 30.6 % — ABNORMAL LOW (ref 36.0–46.0)
Hemoglobin: 9.6 g/dL — ABNORMAL LOW (ref 12.0–15.0)

## 2018-03-09 MED ORDER — EPOETIN ALFA 4000 UNIT/ML IJ SOLN
INTRAMUSCULAR | Status: AC
  Filled 2018-03-09: qty 1

## 2018-03-09 MED ORDER — EPOETIN ALFA 4000 UNIT/ML IJ SOLN
4000.0000 [IU] | Freq: Once | INTRAMUSCULAR | Status: AC
  Administered 2018-03-09: 4000 [IU] via SUBCUTANEOUS

## 2018-03-09 NOTE — Progress Notes (Signed)
Results for Stephanie Middleton, Stephanie Middleton (MRN [id number]) as of 03/09/2018 13:22  Ref. Range 03/09/2018 10:13  Hemoglobin Latest Ref Range: 12.0 - 15.0 g/dL 9.6 (L)  HCT Latest Ref Range: 36.0 - 46.0 % 30.6 (L)

## 2018-03-23 ENCOUNTER — Encounter (HOSPITAL_COMMUNITY)
Admission: RE | Admit: 2018-03-23 | Discharge: 2018-03-23 | Disposition: A | Discharge status: Home / Self Care | Payer: Medicare Other | Source: Ambulatory Visit | Attending: Nephrology | Admitting: Nephrology

## 2018-03-23 ENCOUNTER — Encounter (HOSPITAL_COMMUNITY): Payer: Self-pay

## 2018-03-23 DIAGNOSIS — N184 Chronic kidney disease, stage 4 (severe): Secondary | ICD-10-CM | POA: Diagnosis not present

## 2018-03-23 LAB — POCT HEMOGLOBIN-HEMACUE: Hemoglobin: 9.4 g/dL — ABNORMAL LOW (ref 12.0–15.0)

## 2018-03-23 MED ORDER — EPOETIN ALFA 4000 UNIT/ML IJ SOLN
4000.0000 [IU] | Freq: Once | INTRAMUSCULAR | Status: AC
  Administered 2018-03-23: 4000 [IU] via SUBCUTANEOUS

## 2018-03-23 MED ORDER — EPOETIN ALFA 4000 UNIT/ML IJ SOLN
INTRAMUSCULAR | Status: AC
  Filled 2018-03-23: qty 1

## 2018-04-06 ENCOUNTER — Encounter (HOSPITAL_COMMUNITY)
Admission: RE | Admit: 2018-04-06 | Discharge: 2018-04-06 | Disposition: A | Discharge status: Home / Self Care | Payer: Medicare Other | Source: Ambulatory Visit | Attending: Nephrology | Admitting: Nephrology

## 2018-04-06 DIAGNOSIS — N184 Chronic kidney disease, stage 4 (severe): Secondary | ICD-10-CM | POA: Diagnosis not present

## 2018-04-06 DIAGNOSIS — D631 Anemia in chronic kidney disease: Secondary | ICD-10-CM | POA: Insufficient documentation

## 2018-04-06 LAB — POCT HEMOGLOBIN-HEMACUE: Hemoglobin: 9.5 g/dL — ABNORMAL LOW (ref 12.0–15.0)

## 2018-04-06 MED ORDER — EPOETIN ALFA 4000 UNIT/ML IJ SOLN
INTRAMUSCULAR | Status: AC
  Filled 2018-04-06: qty 1

## 2018-04-06 MED ORDER — EPOETIN ALFA 4000 UNIT/ML IJ SOLN
4000.0000 [IU] | Freq: Once | INTRAMUSCULAR | Status: AC
  Administered 2018-04-06: 4000 [IU] via SUBCUTANEOUS

## 2018-04-06 NOTE — Progress Notes (Signed)
Results for KYA, MAYFIELD (MRN [id number]) as of 04/06/2018 11:02  Ref. Range 04/06/2018 10:06  Hemoglobin Latest Ref Range: 12.0 - 15.0 g/dL 9.5 (L)

## 2018-04-18 ENCOUNTER — Encounter (INDEPENDENT_AMBULATORY_CARE_PROVIDER_SITE_OTHER): Payer: Self-pay | Admitting: Orthopaedic Surgery

## 2018-04-18 ENCOUNTER — Ambulatory Visit (INDEPENDENT_AMBULATORY_CARE_PROVIDER_SITE_OTHER): Payer: Medicare Other | Admitting: Orthopaedic Surgery

## 2018-04-18 VITALS — BP 174/73 | HR 70

## 2018-04-18 DIAGNOSIS — G8929 Other chronic pain: Secondary | ICD-10-CM

## 2018-04-18 DIAGNOSIS — M25561 Pain in right knee: Secondary | ICD-10-CM | POA: Diagnosis not present

## 2018-04-18 NOTE — Progress Notes (Signed)
Office Visit Note   Patient: Stephanie Middleton           Date of Birth: [birth year]           MRN: [id number] Visit Date: 04/18/2018              Requested by: Iona Beard, [address] [place], [place] [address] PCP: Iona Beard, MD   Assessment & Plan: Visit Diagnoses:  1. Chronic pain of right knee     Plan: Osteoarthritis right knee with evidence of chondrocalcinosis.  Long discussion with Mrs. Klose and her daughter.  Have discussed the diagnosis and treatment options.  At the moment she is going to check with her primary care physician regarding appropriate medicines given her other comorbidities.  Of also suggested cortisone and possibly Visco supplementation.  She wants to "think about it".  Follow-Up Instructions: Return if symptoms worsen or fail to improve.   Orders:  No orders of the defined types were placed in this encounter.  No orders of the defined types were placed in this encounter.     Procedures: No procedures performed   Clinical Data: No additional findings.   Subjective: Chief Complaint  Patient presents with  . New Patient (Initial Visit)    R KNEE PAIN FOR 1 MO NO INJURY  Insidious onset of right knee pain over the past 1 to 2 months without injury or trauma.  Occasion Mrs. Griswold is experienced some swelling.  X-rays were performed through her primary care physician's office which I reviewed on the PACS system.  There is calcification within the menisci consistent with chondrocalcinosis.  There are mild degenerative changes in the medial lateral compartments.  The joint spaces are well-maintained.  She is diabetic and is concerned about cortisone.  She is also concerned about any "surgery".  She does use a cane.  She is had pain along the medial lateral compartments and some feeling of "stiffness.  She obviously is concerned about can "too many medicines".  HPI  Review of Systems  Constitutional: Negative for fatigue and fever.  HENT:  Negative for ear pain.   Eyes: Negative for pain.  Respiratory: Negative for cough and shortness of breath.   Cardiovascular: Positive for leg swelling.  Gastrointestinal: Positive for constipation. Negative for diarrhea.  Genitourinary: Negative for difficulty urinating.  Musculoskeletal: Positive for back pain. Negative for neck pain.  Skin: Negative for rash.  Allergic/Immunologic: Negative for food allergies.  Neurological: Positive for weakness. Negative for numbness.  Hematological: Does not bruise/bleed easily.  Psychiatric/Behavioral: Positive for sleep disturbance.     Objective: Vital Signs: BP (!) 174/73 (BP Location: Right Arm, Patient Position: Sitting, Cuff Size: Normal)   Pulse 70   Physical Exam  Constitutional: She is oriented to person, place, and time. She appears well-developed and well-nourished.  HENT:  Mouth/Throat: Oropharynx is clear and moist.  Eyes: Pupils are equal, round, and reactive to light. EOM are normal.  Pulmonary/Chest: Effort normal.  Neurological: She is alert and oriented to person, place, and time.  Skin: Skin is warm and dry.  Psychiatric: She has a normal mood and affect. Her behavior is normal.    Ortho Exam awake alert and oriented x3.  Comfortable sitting.  Very pleasant.  Right knee with minimal effusion but some hypertrophic changes.  Slight increased valgus with weightbearing.  Having some pain along the medial lateral compartment as well as the patellofemoral joint.  No calf pain.  Does have some  nonpitting edema of her ankle and foot.  Foot was warm.  No pain range of motion of either hip.  Straight leg raise negative.  Full extension about 100 degrees of flexion without instability  Specialty Comments:  No specialty comments available.  Imaging: No results found.   PMFS History: There are no active problems to display for this patient.  History reviewed. No pertinent past medical history.  History reviewed. No pertinent  family history.  Past Surgical History:  Procedure Laterality Date  . CHOLECYSTECTOMY    . CYST EXCISION     Social History   Occupational History  . Not on file  Tobacco Use  . Smoking status: Never Smoker  . Smokeless tobacco: Never Used  Substance and Sexual Activity  . Alcohol use: Never    Frequency: Never  . Drug use: Never  . Sexual activity: Not on file

## 2018-04-20 ENCOUNTER — Encounter (HOSPITAL_COMMUNITY)
Admission: RE | Admit: 2018-04-20 | Discharge: 2018-04-20 | Disposition: A | Discharge status: Home / Self Care | Payer: Medicare Other | Source: Ambulatory Visit | Attending: Nephrology | Admitting: Nephrology

## 2018-04-20 DIAGNOSIS — N184 Chronic kidney disease, stage 4 (severe): Secondary | ICD-10-CM | POA: Diagnosis not present

## 2018-04-20 LAB — POCT HEMOGLOBIN-HEMACUE
Hemoglobin: 8.6 g/dL — ABNORMAL LOW (ref 12.0–15.0)
Hemoglobin: 8.8 g/dL — ABNORMAL LOW (ref 12.0–15.0)

## 2018-04-20 MED ORDER — EPOETIN ALFA 4000 UNIT/ML IJ SOLN
INTRAMUSCULAR | Status: AC
  Filled 2018-04-20: qty 1

## 2018-04-20 MED ORDER — EPOETIN ALFA 4000 UNIT/ML IJ SOLN
4000.0000 [IU] | Freq: Once | INTRAMUSCULAR | Status: AC
  Administered 2018-04-20: 4000 [IU] via SUBCUTANEOUS

## 2018-04-20 NOTE — Progress Notes (Signed)
Results for Stephanie Middleton, Stephanie Middleton (MRN [id number]) as of 04/20/2018 14:52  Ref. Range 04/20/2018 10:14  Hemoglobin Latest Ref Range: 12.0 - 15.0 g/dL 8.8 (L)

## 2018-05-04 ENCOUNTER — Encounter (HOSPITAL_COMMUNITY)
Admission: RE | Admit: 2018-05-04 | Discharge: 2018-05-04 | Disposition: A | Discharge status: Home / Self Care | Payer: Medicare Other | Source: Ambulatory Visit | Attending: Nephrology | Admitting: Nephrology

## 2018-05-04 DIAGNOSIS — N184 Chronic kidney disease, stage 4 (severe): Secondary | ICD-10-CM | POA: Diagnosis not present

## 2018-05-04 LAB — POCT HEMOGLOBIN-HEMACUE: HEMOGLOBIN: 9 g/dL — AB (ref 12.0–15.0)

## 2018-05-04 MED ORDER — EPOETIN ALFA 4000 UNIT/ML IJ SOLN
4000.0000 [IU] | Freq: Once | INTRAMUSCULAR | Status: AC
  Administered 2018-05-04: 4000 [IU] via SUBCUTANEOUS

## 2018-05-04 MED ORDER — EPOETIN ALFA 4000 UNIT/ML IJ SOLN
INTRAMUSCULAR | Status: AC
  Filled 2018-05-04: qty 1

## 2018-05-04 NOTE — Progress Notes (Signed)
Results for ROSEBUD, KOENEN (MRN [id number]) as of 05/04/2018 10:30  Ref. Range 05/04/2018 10:08  Hemoglobin Latest Ref Range: 12.0 - 15.0 g/dL 9.0 (L)

## 2018-05-18 ENCOUNTER — Encounter (HOSPITAL_COMMUNITY)
Admission: RE | Admit: 2018-05-18 | Discharge: 2018-05-18 | Disposition: A | Discharge status: Home / Self Care | Payer: Medicare Other | Source: Ambulatory Visit | Attending: Nephrology | Admitting: Nephrology

## 2018-05-18 ENCOUNTER — Encounter (HOSPITAL_COMMUNITY): Payer: Self-pay

## 2018-05-18 DIAGNOSIS — N184 Chronic kidney disease, stage 4 (severe): Secondary | ICD-10-CM | POA: Diagnosis not present

## 2018-05-18 DIAGNOSIS — D631 Anemia in chronic kidney disease: Secondary | ICD-10-CM | POA: Insufficient documentation

## 2018-05-18 LAB — POCT HEMOGLOBIN-HEMACUE: Hemoglobin: 9.6 g/dL — ABNORMAL LOW (ref 12.0–15.0)

## 2018-05-18 MED ORDER — EPOETIN ALFA 4000 UNIT/ML IJ SOLN
4000.0000 [IU] | Freq: Once | INTRAMUSCULAR | Status: AC
  Administered 2018-05-18: 4000 [IU] via SUBCUTANEOUS
  Filled 2018-05-18: qty 1

## 2018-05-18 NOTE — Progress Notes (Signed)
Results for Stephanie Middleton, Stephanie Middleton (MRN [id number]) as of 05/18/2018 10:46  Ref. Range 05/18/2018 10:38  Hemoglobin Latest Ref Range: 12.0 - 15.0 g/dL 9.6 (L)

## 2018-06-01 ENCOUNTER — Encounter (HOSPITAL_COMMUNITY)
Admission: RE | Admit: 2018-06-01 | Discharge: 2018-06-01 | Disposition: A | Discharge status: Home / Self Care | Payer: Medicare Other | Source: Ambulatory Visit | Attending: Nephrology | Admitting: Nephrology

## 2018-06-01 ENCOUNTER — Encounter (HOSPITAL_COMMUNITY): Payer: Self-pay

## 2018-06-01 DIAGNOSIS — N184 Chronic kidney disease, stage 4 (severe): Secondary | ICD-10-CM | POA: Diagnosis not present

## 2018-06-01 LAB — POCT HEMOGLOBIN-HEMACUE: Hemoglobin: 9.1 g/dL — ABNORMAL LOW (ref 12.0–15.0)

## 2018-06-01 MED ORDER — EPOETIN ALFA 4000 UNIT/ML IJ SOLN
INTRAMUSCULAR | Status: AC
  Filled 2018-06-01: qty 1

## 2018-06-01 MED ORDER — EPOETIN ALFA 4000 UNIT/ML IJ SOLN
4000.0000 [IU] | Freq: Once | INTRAMUSCULAR | Status: AC
  Administered 2018-06-01: 4000 [IU] via SUBCUTANEOUS

## 2018-06-15 ENCOUNTER — Encounter (HOSPITAL_COMMUNITY): Payer: Self-pay

## 2018-06-15 ENCOUNTER — Encounter (HOSPITAL_COMMUNITY)
Admission: RE | Admit: 2018-06-15 | Discharge: 2018-06-15 | Disposition: A | Discharge status: Home / Self Care | Payer: Medicare Other | Source: Ambulatory Visit | Attending: Nephrology | Admitting: Nephrology

## 2018-06-15 DIAGNOSIS — N184 Chronic kidney disease, stage 4 (severe): Secondary | ICD-10-CM | POA: Diagnosis not present

## 2018-06-15 DIAGNOSIS — D631 Anemia in chronic kidney disease: Secondary | ICD-10-CM | POA: Insufficient documentation

## 2018-06-15 LAB — POCT HEMOGLOBIN-HEMACUE: Hemoglobin: 9.1 g/dL — ABNORMAL LOW (ref 12.0–15.0)

## 2018-06-15 MED ORDER — EPOETIN ALFA 4000 UNIT/ML IJ SOLN
INTRAMUSCULAR | Status: AC
  Filled 2018-06-15: qty 1

## 2018-06-15 MED ORDER — EPOETIN ALFA 4000 UNIT/ML IJ SOLN
4000.0000 [IU] | Freq: Once | INTRAMUSCULAR | Status: AC
  Administered 2018-06-15: 4000 [IU] via SUBCUTANEOUS

## 2018-06-29 ENCOUNTER — Encounter (HOSPITAL_COMMUNITY)
Admission: RE | Admit: 2018-06-29 | Discharge: 2018-06-29 | Disposition: A | Discharge status: Home / Self Care | Payer: Medicare Other | Source: Ambulatory Visit | Attending: Nephrology | Admitting: Nephrology

## 2018-06-29 DIAGNOSIS — N184 Chronic kidney disease, stage 4 (severe): Secondary | ICD-10-CM | POA: Diagnosis not present

## 2018-06-29 LAB — POCT HEMOGLOBIN-HEMACUE: HEMOGLOBIN: 9.4 g/dL — AB (ref 12.0–15.0)

## 2018-06-29 MED ORDER — EPOETIN ALFA 4000 UNIT/ML IJ SOLN
4000.0000 [IU] | Freq: Once | INTRAMUSCULAR | Status: AC
  Administered 2018-06-29: 4000 [IU] via SUBCUTANEOUS

## 2018-06-29 MED ORDER — EPOETIN ALFA 4000 UNIT/ML IJ SOLN
INTRAMUSCULAR | Status: AC
  Filled 2018-06-29: qty 1

## 2018-07-13 ENCOUNTER — Encounter (HOSPITAL_COMMUNITY): Payer: Self-pay

## 2018-07-13 ENCOUNTER — Encounter (HOSPITAL_COMMUNITY)
Admission: RE | Admit: 2018-07-13 | Discharge: 2018-07-13 | Disposition: A | Discharge status: Home / Self Care | Payer: Medicare Other | Source: Ambulatory Visit | Attending: Nephrology | Admitting: Nephrology

## 2018-07-13 ENCOUNTER — Other Ambulatory Visit (HOSPITAL_COMMUNITY): Payer: Self-pay

## 2018-07-13 DIAGNOSIS — D631 Anemia in chronic kidney disease: Secondary | ICD-10-CM | POA: Insufficient documentation

## 2018-07-13 DIAGNOSIS — N184 Chronic kidney disease, stage 4 (severe): Secondary | ICD-10-CM | POA: Insufficient documentation

## 2018-07-13 LAB — POCT HEMOGLOBIN-HEMACUE: HEMOGLOBIN: 9.5 g/dL — AB (ref 12.0–15.0)

## 2018-07-13 MED ORDER — EPOETIN ALFA 4000 UNIT/ML IJ SOLN
4000.0000 [IU] | Freq: Once | INTRAMUSCULAR | Status: AC
  Administered 2018-07-13: 4000 [IU] via SUBCUTANEOUS

## 2018-07-13 MED ORDER — EPOETIN ALFA 4000 UNIT/ML IJ SOLN
INTRAMUSCULAR | Status: AC
  Filled 2018-07-13: qty 1

## 2018-07-27 ENCOUNTER — Encounter (HOSPITAL_COMMUNITY): Payer: Self-pay

## 2018-07-27 ENCOUNTER — Encounter (HOSPITAL_COMMUNITY)
Admission: RE | Admit: 2018-07-27 | Discharge: 2018-07-27 | Disposition: A | Discharge status: Home / Self Care | Payer: Medicare Other | Source: Ambulatory Visit | Attending: Nephrology | Admitting: Nephrology

## 2018-07-27 DIAGNOSIS — N184 Chronic kidney disease, stage 4 (severe): Secondary | ICD-10-CM | POA: Diagnosis not present

## 2018-07-27 LAB — POCT HEMOGLOBIN-HEMACUE: Hemoglobin: 9.5 g/dL — ABNORMAL LOW (ref 12.0–15.0)

## 2018-07-27 MED ORDER — EPOETIN ALFA 4000 UNIT/ML IJ SOLN
INTRAMUSCULAR | Status: AC
  Filled 2018-07-27: qty 1

## 2018-07-27 MED ORDER — EPOETIN ALFA 4000 UNIT/ML IJ SOLN
4000.0000 [IU] | Freq: Once | INTRAMUSCULAR | Status: AC
  Administered 2018-07-27: 4000 [IU] via SUBCUTANEOUS

## 2018-08-09 IMAGING — DX DG KNEE COMPLETE 4+V*R*
4 series · 4 of 4 positions shown · non-contrast
Comparison: None.

CLINICAL DATA: 86-year-old female with chronic right knee
pain/stiffness. No apparent injury. Initial encounter.

EXAM:
RIGHT KNEE - COMPLETE 4+ VIEW

[knee ap]
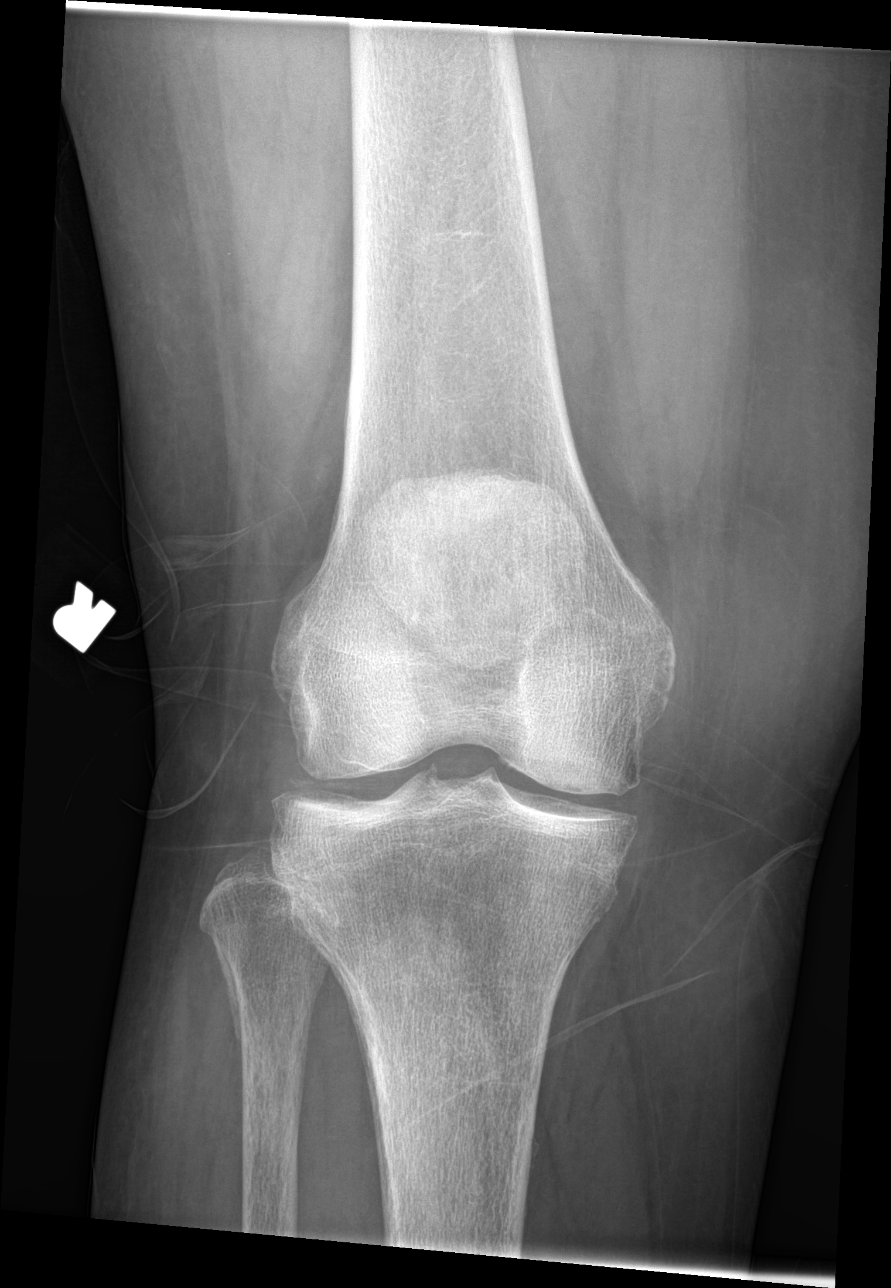

[knee obl (1 of 2)]
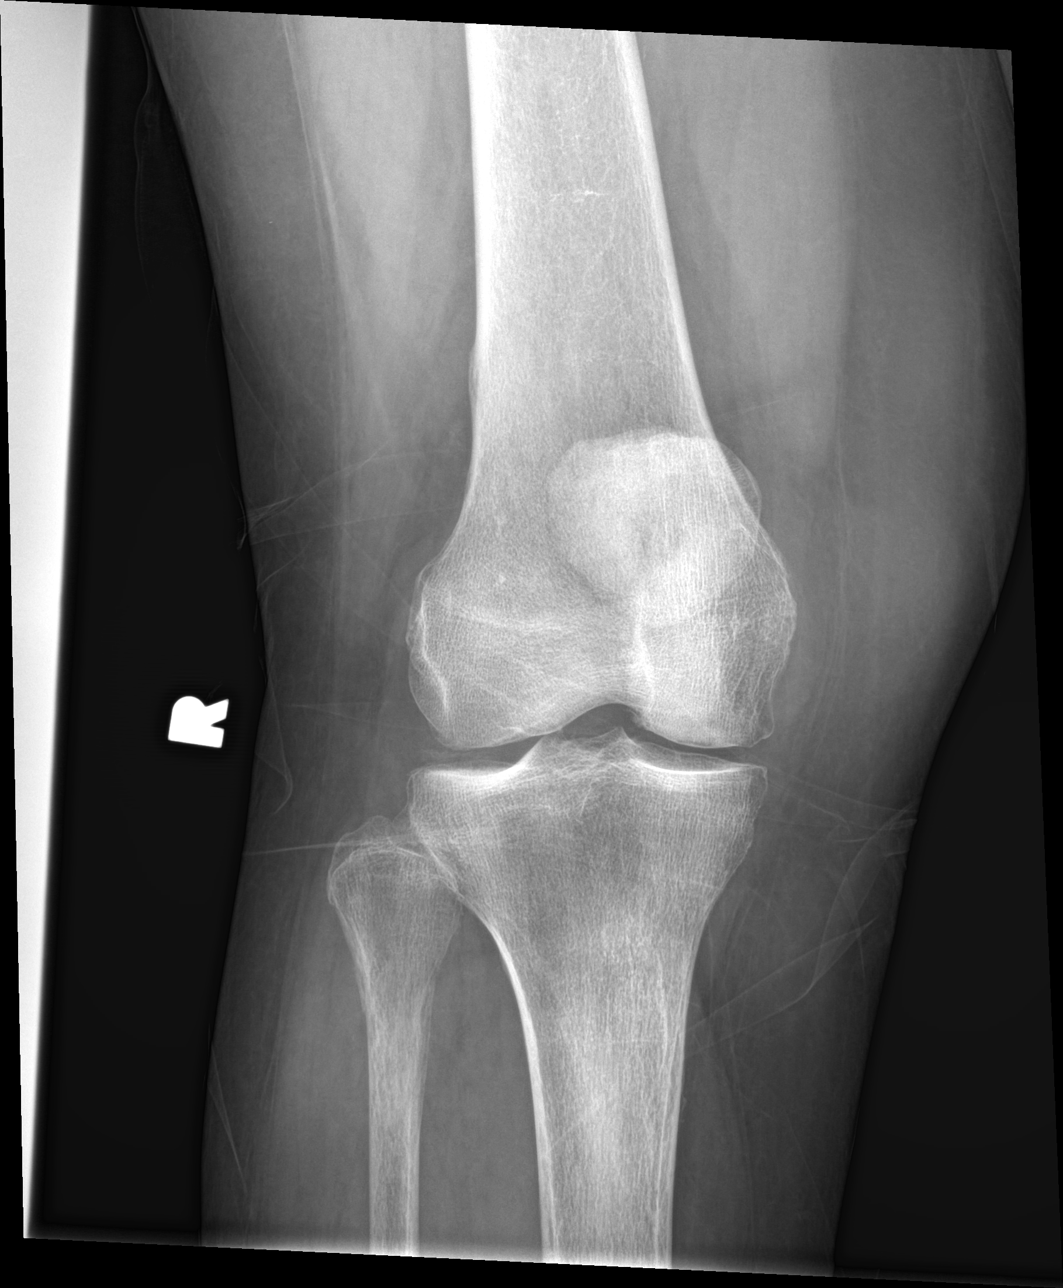

[knee obl (2 of 2)]
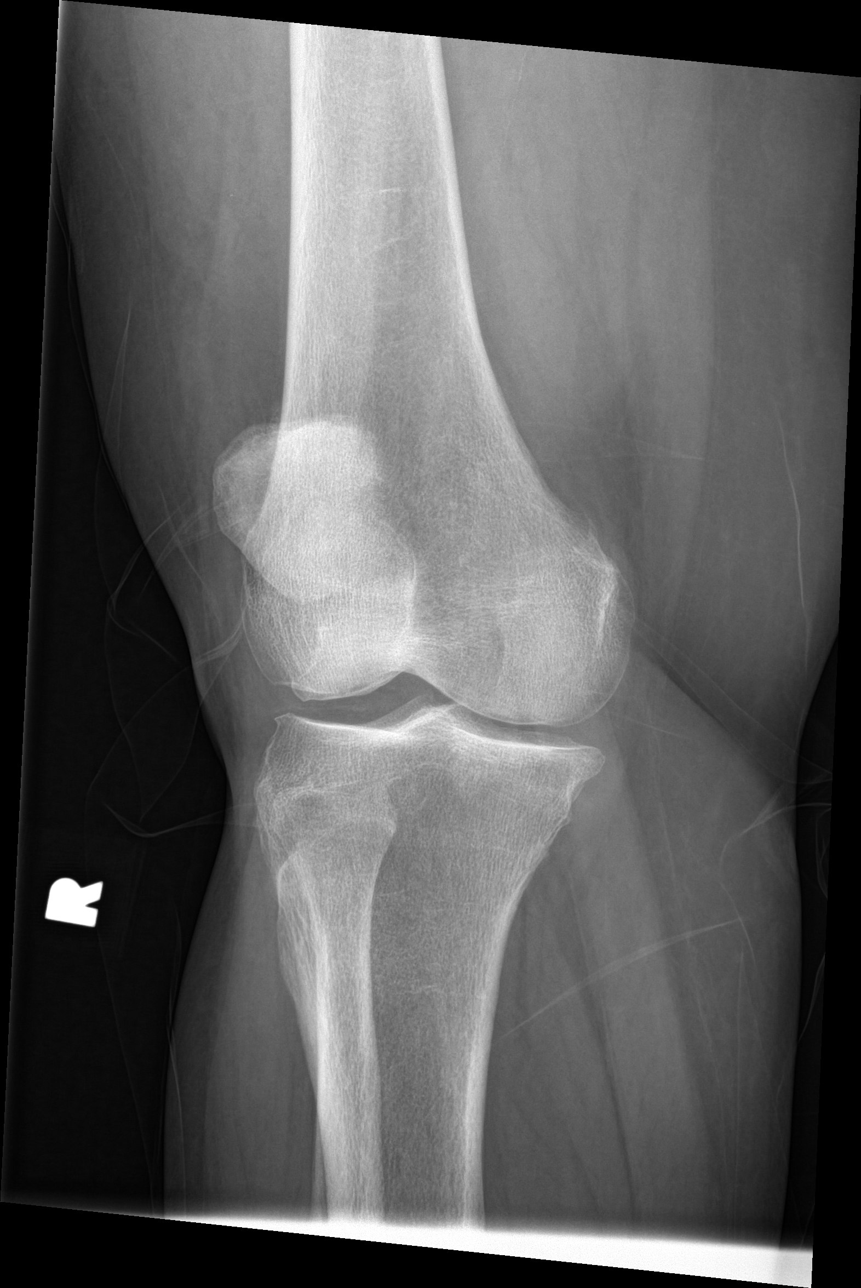

[knee lat]
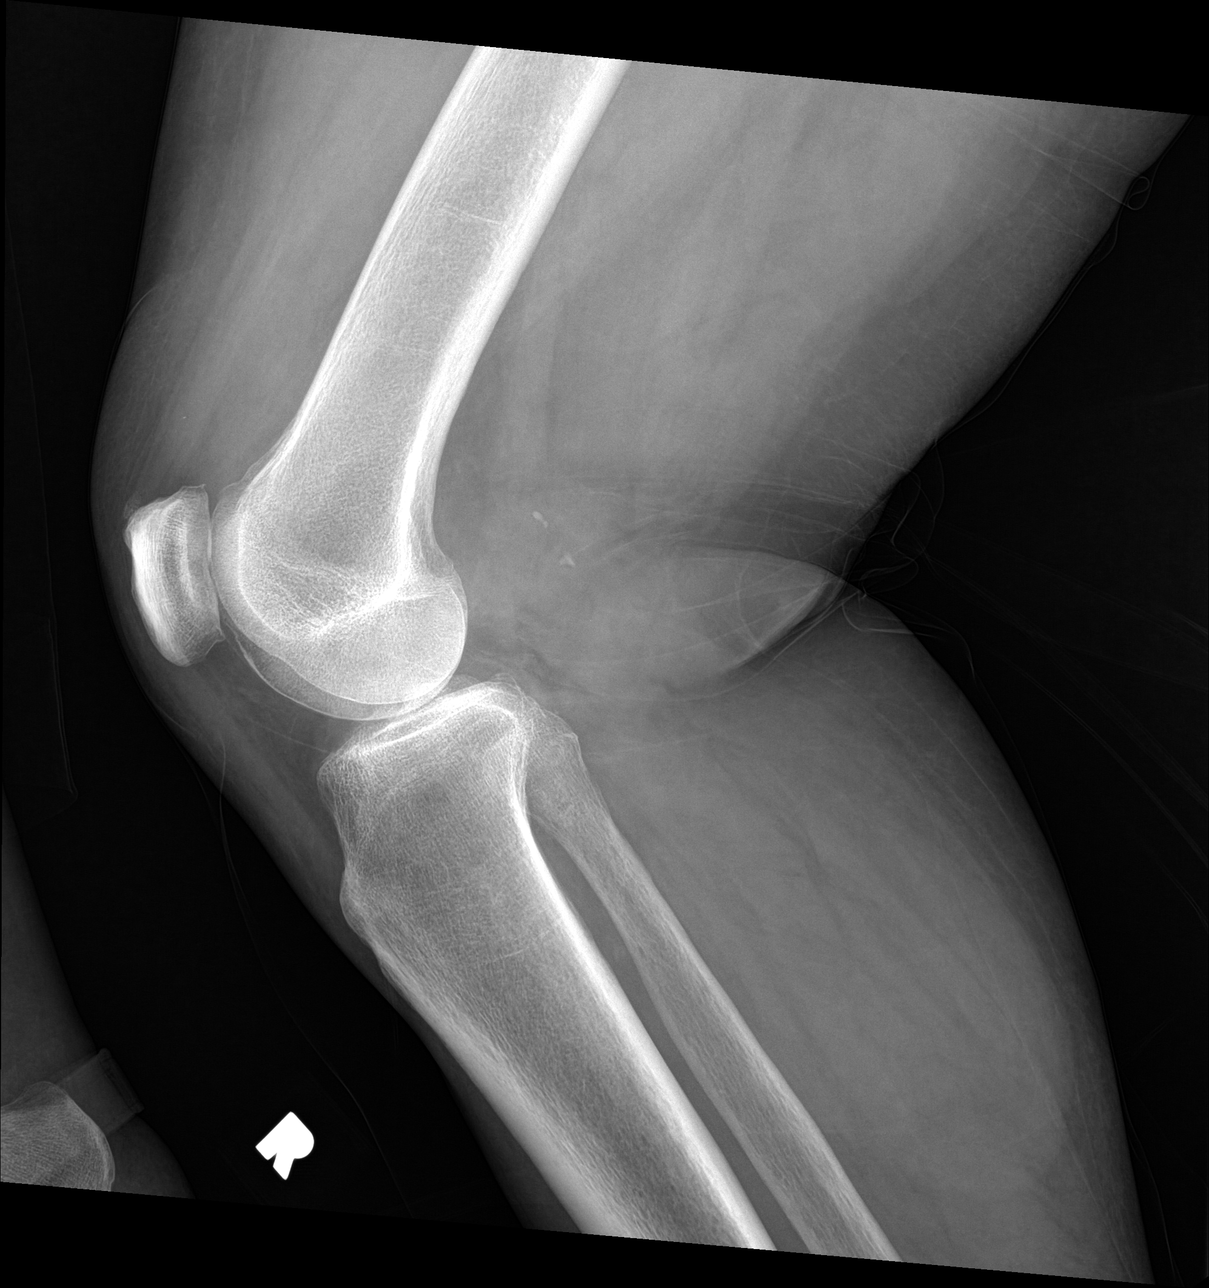

[4 of 4 positions shown; findings below may reference images not displayed]

FINDINGS: Mild patellofemoral joint degenerative changes.

Question dense menisci as can be seen with chondrocalcinosis.

No significant medial or lateral tibiofemoral joint space narrowing.

Suspect very small suprapatellar joint effusion.

No fracture or dislocation.
IMPRESSION: Mild patellofemoral joint degenerative changes.

Question dense menisci as can be seen with chondrocalcinosis.

Suspect very small suprapatellar joint effusion.

## 2018-08-10 ENCOUNTER — Encounter (HOSPITAL_COMMUNITY)
Admission: RE | Admit: 2018-08-10 | Discharge: 2018-08-10 | Disposition: A | Discharge status: Home / Self Care | Payer: Medicare Other | Source: Ambulatory Visit | Attending: Nephrology | Admitting: Nephrology

## 2018-08-10 ENCOUNTER — Encounter (HOSPITAL_COMMUNITY): Payer: Self-pay

## 2018-08-10 DIAGNOSIS — N184 Chronic kidney disease, stage 4 (severe): Secondary | ICD-10-CM | POA: Insufficient documentation

## 2018-08-10 DIAGNOSIS — D631 Anemia in chronic kidney disease: Secondary | ICD-10-CM | POA: Diagnosis present

## 2018-08-10 LAB — POCT HEMOGLOBIN-HEMACUE: Hemoglobin: 9.1 g/dL — ABNORMAL LOW (ref 12.0–15.0)

## 2018-08-10 MED ORDER — EPOETIN ALFA 4000 UNIT/ML IJ SOLN
4000.0000 [IU] | Freq: Once | INTRAMUSCULAR | Status: AC
  Administered 2018-08-10: 4000 [IU] via SUBCUTANEOUS
  Filled 2018-08-10: qty 1

## 2018-08-24 ENCOUNTER — Encounter (HOSPITAL_COMMUNITY)
Admission: RE | Admit: 2018-08-24 | Discharge: 2018-08-24 | Disposition: A | Discharge status: Home / Self Care | Payer: Medicare Other | Source: Ambulatory Visit | Attending: Nephrology | Admitting: Nephrology

## 2018-08-24 ENCOUNTER — Encounter (HOSPITAL_COMMUNITY): Payer: Self-pay

## 2018-08-24 DIAGNOSIS — N184 Chronic kidney disease, stage 4 (severe): Secondary | ICD-10-CM | POA: Diagnosis not present

## 2018-08-24 LAB — POCT HEMOGLOBIN-HEMACUE: HEMOGLOBIN: 9.6 g/dL — AB (ref 12.0–15.0)

## 2018-08-24 MED ORDER — EPOETIN ALFA 4000 UNIT/ML IJ SOLN
4000.0000 [IU] | Freq: Once | INTRAMUSCULAR | Status: AC
  Administered 2018-08-24: 4000 [IU] via SUBCUTANEOUS

## 2018-08-24 MED ORDER — EPOETIN ALFA 4000 UNIT/ML IJ SOLN
INTRAMUSCULAR | Status: AC
  Filled 2018-08-24: qty 1

## 2018-09-07 ENCOUNTER — Encounter (HOSPITAL_COMMUNITY)
Admission: RE | Admit: 2018-09-07 | Discharge: 2018-09-07 | Disposition: A | Discharge status: Home / Self Care | Payer: Medicare Other | Source: Ambulatory Visit | Attending: Nephrology | Admitting: Nephrology

## 2018-09-07 ENCOUNTER — Encounter (HOSPITAL_COMMUNITY): Payer: Self-pay

## 2018-09-07 DIAGNOSIS — D631 Anemia in chronic kidney disease: Secondary | ICD-10-CM | POA: Diagnosis present

## 2018-09-07 DIAGNOSIS — N184 Chronic kidney disease, stage 4 (severe): Secondary | ICD-10-CM | POA: Insufficient documentation

## 2018-09-07 LAB — POCT HEMOGLOBIN-HEMACUE: HEMOGLOBIN: 8.2 g/dL — AB (ref 12.0–15.0)

## 2018-09-07 MED ORDER — EPOETIN ALFA 4000 UNIT/ML IJ SOLN
INTRAMUSCULAR | Status: AC
  Filled 2018-09-07: qty 1

## 2018-09-07 MED ORDER — EPOETIN ALFA 4000 UNIT/ML IJ SOLN
4000.0000 [IU] | Freq: Once | INTRAMUSCULAR | Status: AC
  Administered 2018-09-07: 4000 [IU] via SUBCUTANEOUS

## 2018-09-21 ENCOUNTER — Encounter (HOSPITAL_COMMUNITY)
Admission: RE | Admit: 2018-09-21 | Discharge: 2018-09-21 | Disposition: A | Discharge status: Home / Self Care | Payer: Medicare Other | Source: Ambulatory Visit | Attending: Nephrology | Admitting: Nephrology

## 2018-09-21 DIAGNOSIS — N184 Chronic kidney disease, stage 4 (severe): Secondary | ICD-10-CM | POA: Diagnosis not present

## 2018-09-21 LAB — POCT HEMOGLOBIN-HEMACUE: Hemoglobin: 9 g/dL — ABNORMAL LOW (ref 12.0–15.0)

## 2018-09-21 MED ORDER — EPOETIN ALFA 4000 UNIT/ML IJ SOLN
INTRAMUSCULAR | Status: AC
  Filled 2018-09-21: qty 1

## 2018-09-21 MED ORDER — EPOETIN ALFA 4000 UNIT/ML IJ SOLN
4000.0000 [IU] | Freq: Once | INTRAMUSCULAR | Status: AC
  Administered 2018-09-21: 4000 [IU] via SUBCUTANEOUS

## 2018-09-21 NOTE — Progress Notes (Signed)
Results for Stephanie Middleton, Stephanie Middleton (MRN [id number]) as of 09/21/2018 12:37  Ref. Range 09/21/2018 10:14  Hemoglobin Latest Ref Range: 12.0 - 15.0 g/dL 9.0 (L)

## 2018-10-06 ENCOUNTER — Encounter (HOSPITAL_COMMUNITY)
Admission: RE | Admit: 2018-10-06 | Discharge: 2018-10-06 | Disposition: A | Discharge status: Home / Self Care | Payer: Medicare Other | Source: Ambulatory Visit | Attending: Nephrology | Admitting: Nephrology

## 2018-10-06 ENCOUNTER — Encounter (HOSPITAL_COMMUNITY): Payer: Self-pay

## 2018-10-06 DIAGNOSIS — N184 Chronic kidney disease, stage 4 (severe): Secondary | ICD-10-CM | POA: Insufficient documentation

## 2018-10-06 DIAGNOSIS — D631 Anemia in chronic kidney disease: Secondary | ICD-10-CM | POA: Insufficient documentation

## 2018-10-06 LAB — POCT HEMOGLOBIN-HEMACUE: HEMOGLOBIN: 9 g/dL — AB (ref 12.0–15.0)

## 2018-10-06 MED ORDER — EPOETIN ALFA 4000 UNIT/ML IJ SOLN
INTRAMUSCULAR | Status: AC
  Filled 2018-10-06: qty 1

## 2018-10-06 MED ORDER — EPOETIN ALFA 4000 UNIT/ML IJ SOLN
4000.0000 [IU] | Freq: Once | INTRAMUSCULAR | Status: AC
  Administered 2018-10-06: 4000 [IU] via SUBCUTANEOUS

## 2018-10-19 ENCOUNTER — Encounter (HOSPITAL_COMMUNITY)
Admission: RE | Admit: 2018-10-19 | Discharge: 2018-10-19 | Disposition: A | Discharge status: Home / Self Care | Payer: Medicare Other | Source: Ambulatory Visit | Attending: Nephrology | Admitting: Nephrology

## 2018-10-19 DIAGNOSIS — N184 Chronic kidney disease, stage 4 (severe): Secondary | ICD-10-CM | POA: Diagnosis not present

## 2018-10-19 MED ORDER — EPOETIN ALFA 4000 UNIT/ML IJ SOLN
4000.0000 [IU] | Freq: Once | INTRAMUSCULAR | Status: AC
  Administered 2018-10-19: 4000 [IU] via SUBCUTANEOUS

## 2018-10-19 MED ORDER — EPOETIN ALFA 4000 UNIT/ML IJ SOLN
INTRAMUSCULAR | Status: AC
  Filled 2018-10-19: qty 1

## 2018-10-19 NOTE — Progress Notes (Signed)
Hgb 9.1.  Procrit 4000 units SQ given left upper abd.

## 2018-10-20 ENCOUNTER — Ambulatory Visit (HOSPITAL_COMMUNITY): Payer: Self-pay

## 2018-10-20 ENCOUNTER — Other Ambulatory Visit (HOSPITAL_COMMUNITY): Payer: Self-pay

## 2018-10-21 LAB — POCT HEMOGLOBIN-HEMACUE: Hemoglobin: 9.1 g/dL — ABNORMAL LOW (ref 12.0–15.0)

## 2018-10-21 NOTE — Progress Notes (Signed)
Results for NARMEEN, KERPER (MRN [id number]) as of 10/21/2018 14:52  Ref. Range 10/19/2018 09:41  Hemoglobin Latest Ref Range: 12.0 - 15.0 g/dL 9.1 (L)

## 2018-11-02 ENCOUNTER — Other Ambulatory Visit (HOSPITAL_COMMUNITY): Payer: Self-pay

## 2018-11-02 ENCOUNTER — Ambulatory Visit (HOSPITAL_COMMUNITY): Payer: Self-pay

## 2018-11-03 ENCOUNTER — Encounter (HOSPITAL_COMMUNITY): Payer: Self-pay

## 2018-11-03 ENCOUNTER — Encounter (HOSPITAL_COMMUNITY)
Admission: RE | Admit: 2018-11-03 | Discharge: 2018-11-03 | Disposition: A | Discharge status: Home / Self Care | Payer: Medicare Other | Source: Ambulatory Visit | Attending: Nephrology | Admitting: Nephrology

## 2018-11-03 DIAGNOSIS — N184 Chronic kidney disease, stage 4 (severe): Secondary | ICD-10-CM | POA: Diagnosis not present

## 2018-11-03 LAB — POCT HEMOGLOBIN-HEMACUE: HEMOGLOBIN: 8.9 g/dL — AB (ref 12.0–15.0)

## 2018-11-03 MED ORDER — EPOETIN ALFA 4000 UNIT/ML IJ SOLN
4000.0000 [IU] | Freq: Once | INTRAMUSCULAR | Status: AC
  Administered 2018-11-03: 4000 [IU] via SUBCUTANEOUS

## 2018-11-03 MED ORDER — EPOETIN ALFA 4000 UNIT/ML IJ SOLN
INTRAMUSCULAR | Status: AC
  Filled 2018-11-03: qty 1

## 2018-11-16 ENCOUNTER — Encounter (HOSPITAL_COMMUNITY)
Admission: RE | Admit: 2018-11-16 | Discharge: 2018-11-16 | Disposition: A | Discharge status: Home / Self Care | Payer: Medicare Other | Source: Ambulatory Visit | Attending: Nephrology | Admitting: Nephrology

## 2018-11-16 ENCOUNTER — Encounter (HOSPITAL_COMMUNITY): Payer: Self-pay

## 2018-11-16 DIAGNOSIS — N184 Chronic kidney disease, stage 4 (severe): Secondary | ICD-10-CM | POA: Insufficient documentation

## 2018-11-16 DIAGNOSIS — D631 Anemia in chronic kidney disease: Secondary | ICD-10-CM | POA: Diagnosis present

## 2018-11-16 LAB — POCT HEMOGLOBIN-HEMACUE: HEMOGLOBIN: 8.9 g/dL — AB (ref 12.0–15.0)

## 2018-11-16 MED ORDER — EPOETIN ALFA 4000 UNIT/ML IJ SOLN
4000.0000 [IU] | Freq: Once | INTRAMUSCULAR | Status: AC
  Administered 2018-11-16: 4000 [IU] via SUBCUTANEOUS
  Filled 2018-11-16: qty 1

## 2018-11-30 ENCOUNTER — Encounter (HOSPITAL_COMMUNITY)
Admission: RE | Admit: 2018-11-30 | Discharge: 2018-11-30 | Disposition: A | Discharge status: Home / Self Care | Payer: Medicare Other | Source: Ambulatory Visit | Attending: Nephrology | Admitting: Nephrology

## 2018-11-30 DIAGNOSIS — N184 Chronic kidney disease, stage 4 (severe): Secondary | ICD-10-CM | POA: Diagnosis not present

## 2018-11-30 LAB — POCT HEMOGLOBIN-HEMACUE: HEMOGLOBIN: 9.2 g/dL — AB (ref 12.0–15.0)

## 2018-11-30 MED ORDER — EPOETIN ALFA 4000 UNIT/ML IJ SOLN
INTRAMUSCULAR | Status: AC
  Filled 2018-11-30: qty 1

## 2018-11-30 MED ORDER — EPOETIN ALFA 4000 UNIT/ML IJ SOLN
4000.0000 [IU] | Freq: Once | INTRAMUSCULAR | Status: AC
  Administered 2018-11-30: 4000 [IU] via SUBCUTANEOUS

## 2018-12-14 ENCOUNTER — Other Ambulatory Visit: Payer: Self-pay

## 2018-12-14 ENCOUNTER — Encounter (HOSPITAL_COMMUNITY)
Admission: RE | Admit: 2018-12-14 | Discharge: 2018-12-14 | Disposition: A | Discharge status: Home / Self Care | Payer: Medicare Other | Source: Ambulatory Visit | Attending: Nephrology | Admitting: Nephrology

## 2018-12-14 ENCOUNTER — Encounter (HOSPITAL_COMMUNITY): Payer: Self-pay

## 2018-12-14 DIAGNOSIS — D631 Anemia in chronic kidney disease: Secondary | ICD-10-CM | POA: Diagnosis present

## 2018-12-14 DIAGNOSIS — N184 Chronic kidney disease, stage 4 (severe): Secondary | ICD-10-CM | POA: Diagnosis not present

## 2018-12-14 HISTORY — DX: Chronic kidney disease, unspecified: N18.9

## 2018-12-14 HISTORY — DX: Type 2 diabetes mellitus without complications: E11.9

## 2018-12-14 HISTORY — DX: Essential (primary) hypertension: I10

## 2018-12-14 HISTORY — DX: Pure hypercholesterolemia, unspecified: E78.00

## 2018-12-14 HISTORY — DX: Other chronic pain: G89.29

## 2018-12-14 HISTORY — DX: Anemia, unspecified: D64.9

## 2018-12-14 LAB — POCT HEMOGLOBIN-HEMACUE: Hemoglobin: 9.3 g/dL — ABNORMAL LOW (ref 12.0–15.0)

## 2018-12-14 MED ORDER — EPOETIN ALFA 4000 UNIT/ML IJ SOLN
INTRAMUSCULAR | Status: AC
  Filled 2018-12-14: qty 1

## 2018-12-14 MED ORDER — EPOETIN ALFA 4000 UNIT/ML IJ SOLN
4000.0000 [IU] | Freq: Once | INTRAMUSCULAR | Status: AC
  Administered 2018-12-14: 4000 [IU] via SUBCUTANEOUS

## 2018-12-28 ENCOUNTER — Inpatient Hospital Stay (HOSPITAL_COMMUNITY): Admission: RE | Admit: 2018-12-28 | Payer: Self-pay | Source: Ambulatory Visit

## 2018-12-28 ENCOUNTER — Ambulatory Visit (HOSPITAL_COMMUNITY): Payer: Self-pay

## 2018-12-28 ENCOUNTER — Other Ambulatory Visit (HOSPITAL_COMMUNITY): Payer: Self-pay

## 2019-01-25 ENCOUNTER — Encounter (HOSPITAL_COMMUNITY)
Admission: RE | Admit: 2019-01-25 | Discharge: 2019-01-25 | Disposition: A | Discharge status: Home / Self Care | Payer: Medicare Other | Source: Ambulatory Visit | Attending: Nephrology | Admitting: Nephrology

## 2019-01-25 ENCOUNTER — Encounter (HOSPITAL_COMMUNITY): Payer: Self-pay

## 2019-01-25 ENCOUNTER — Other Ambulatory Visit: Payer: Self-pay

## 2019-01-25 DIAGNOSIS — N184 Chronic kidney disease, stage 4 (severe): Secondary | ICD-10-CM | POA: Diagnosis not present

## 2019-01-25 DIAGNOSIS — D631 Anemia in chronic kidney disease: Secondary | ICD-10-CM | POA: Diagnosis present

## 2019-01-25 LAB — RENAL FUNCTION PANEL
Albumin: 4.1 g/dL (ref 3.5–5.0)
Anion gap: 12 (ref 5–15)
BUN: 40 mg/dL — ABNORMAL HIGH (ref 8–23)
CO2: 21 mmol/L — ABNORMAL LOW (ref 22–32)
Calcium: 9.3 mg/dL (ref 8.9–10.3)
Chloride: 106 mmol/L (ref 98–111)
Creatinine, Ser: 2.05 mg/dL — ABNORMAL HIGH (ref 0.44–1.00)
GFR calc Af Amer: 25 mL/min — ABNORMAL LOW (ref >60)
GFR calc non Af Amer: 22 mL/min — ABNORMAL LOW (ref >60)
Glucose, Bld: 218 mg/dL — ABNORMAL HIGH (ref 70–99)
Phosphorus: 4.2 mg/dL (ref 2.5–4.6)
Potassium: 4.6 mmol/L (ref 3.5–5.1)
Sodium: 139 mmol/L (ref 135–145)

## 2019-01-25 LAB — IRON AND TIBC
Iron: 36 ug/dL (ref 28–170)
Saturation Ratios: 14 % (ref 10.4–31.8)
TIBC: 250 ug/dL (ref 250–450)
UIBC: 214 ug/dL

## 2019-01-25 LAB — HEMOGLOBIN AND HEMATOCRIT, BLOOD
HCT: 27.9 % — ABNORMAL LOW (ref 36.0–46.0)
Hemoglobin: 8.6 g/dL — ABNORMAL LOW (ref 12.0–15.0)

## 2019-01-25 LAB — FERRITIN: Ferritin: 541 ng/mL — ABNORMAL HIGH (ref 11–307)

## 2019-01-25 LAB — POCT HEMOGLOBIN-HEMACUE: Hemoglobin: 8.7 g/dL — ABNORMAL LOW (ref 12.0–15.0)

## 2019-01-25 MED ORDER — EPOETIN ALFA 4000 UNIT/ML IJ SOLN
INTRAMUSCULAR | Status: AC
  Filled 2019-01-25: qty 1

## 2019-01-25 MED ORDER — EPOETIN ALFA 4000 UNIT/ML IJ SOLN
4000.0000 [IU] | Freq: Once | INTRAMUSCULAR | Status: AC
  Administered 2019-01-25: 4000 [IU] via SUBCUTANEOUS

## 2019-01-26 ENCOUNTER — Encounter (HOSPITAL_COMMUNITY)
Admission: RE | Admit: 2019-01-26 | Discharge: 2019-01-26 | Disposition: A | Discharge status: Home / Self Care | Payer: Medicare Other | Source: Ambulatory Visit | Attending: Nephrology | Admitting: Nephrology

## 2019-01-26 DIAGNOSIS — N184 Chronic kidney disease, stage 4 (severe): Secondary | ICD-10-CM | POA: Diagnosis not present

## 2019-01-26 LAB — PROTEIN / CREATININE RATIO, URINE
Creatinine, Urine: 78.78 mg/dL
Protein Creatinine Ratio: 0.42 mg/mg{Cre} — ABNORMAL HIGH (ref 0.00–0.15)
Total Protein, Urine: 33 mg/dL

## 2019-01-26 LAB — PTH, INTACT AND CALCIUM
Calcium, Total (PTH): 9.5 mg/dL (ref 8.7–10.3)
PTH: 32 pg/mL (ref 15–65)

## 2019-01-26 LAB — VITAMIN D 25 HYDROXY (VIT D DEFICIENCY, FRACTURES): Vit D, 25-Hydroxy: 66.2 ng/mL (ref 30.0–100.0)

## 2019-02-08 ENCOUNTER — Encounter (HOSPITAL_COMMUNITY): Payer: Self-pay

## 2019-02-08 ENCOUNTER — Encounter (HOSPITAL_COMMUNITY)
Admission: RE | Admit: 2019-02-08 | Discharge: 2019-02-08 | Disposition: A | Discharge status: Home / Self Care | Payer: Medicare Other | Source: Ambulatory Visit | Attending: Nephrology | Admitting: Nephrology

## 2019-02-08 ENCOUNTER — Other Ambulatory Visit: Payer: Self-pay

## 2019-02-08 DIAGNOSIS — N184 Chronic kidney disease, stage 4 (severe): Secondary | ICD-10-CM | POA: Diagnosis not present

## 2019-02-08 DIAGNOSIS — D631 Anemia in chronic kidney disease: Secondary | ICD-10-CM | POA: Insufficient documentation

## 2019-02-08 LAB — POCT HEMOGLOBIN-HEMACUE: Hemoglobin: 8.9 g/dL — ABNORMAL LOW (ref 12.0–15.0)

## 2019-02-08 MED ORDER — EPOETIN ALFA 4000 UNIT/ML IJ SOLN
4000.0000 [IU] | Freq: Once | INTRAMUSCULAR | Status: AC
  Administered 2019-02-08: 4000 [IU] via SUBCUTANEOUS
  Filled 2019-02-08: qty 1

## 2019-02-22 ENCOUNTER — Encounter (HOSPITAL_COMMUNITY)
Admission: RE | Admit: 2019-02-22 | Discharge: 2019-02-22 | Disposition: A | Discharge status: Home / Self Care | Payer: Medicare Other | Source: Ambulatory Visit | Attending: Nephrology | Admitting: Nephrology

## 2019-02-22 ENCOUNTER — Other Ambulatory Visit: Payer: Self-pay

## 2019-02-22 ENCOUNTER — Encounter (HOSPITAL_COMMUNITY): Payer: Self-pay

## 2019-02-22 DIAGNOSIS — N184 Chronic kidney disease, stage 4 (severe): Secondary | ICD-10-CM | POA: Diagnosis not present

## 2019-02-22 LAB — RENAL FUNCTION PANEL
Albumin: 4.2 g/dL (ref 3.5–5.0)
Anion gap: 11 (ref 5–15)
BUN: 49 mg/dL — ABNORMAL HIGH (ref 8–23)
CO2: 23 mmol/L (ref 22–32)
Calcium: 9.6 mg/dL (ref 8.9–10.3)
Chloride: 108 mmol/L (ref 98–111)
Creatinine, Ser: 2.14 mg/dL — ABNORMAL HIGH (ref 0.44–1.00)
GFR calc Af Amer: 24 mL/min — ABNORMAL LOW (ref >60)
GFR calc non Af Amer: 20 mL/min — ABNORMAL LOW (ref >60)
Glucose, Bld: 165 mg/dL — ABNORMAL HIGH (ref 70–99)
Phosphorus: 3.8 mg/dL (ref 2.5–4.6)
Potassium: 4.5 mmol/L (ref 3.5–5.1)
Sodium: 142 mmol/L (ref 135–145)

## 2019-02-22 LAB — PROTEIN / CREATININE RATIO, URINE
Creatinine, Urine: 78.6 mg/dL
Protein Creatinine Ratio: 0.34 mg/mg{Cre} — ABNORMAL HIGH (ref 0.00–0.15)
Total Protein, Urine: 27 mg/dL

## 2019-02-22 LAB — HEMOGLOBIN AND HEMATOCRIT, BLOOD
HCT: 29.9 % — ABNORMAL LOW (ref 36.0–46.0)
Hemoglobin: 9.2 g/dL — ABNORMAL LOW (ref 12.0–15.0)

## 2019-02-22 LAB — IRON AND TIBC
Iron: 52 ug/dL (ref 28–170)
Saturation Ratios: 18 % (ref 10.4–31.8)
TIBC: 291 ug/dL (ref 250–450)
UIBC: 239 ug/dL

## 2019-02-22 LAB — FERRITIN: Ferritin: 622 ng/mL — ABNORMAL HIGH (ref 11–307)

## 2019-02-22 LAB — POCT HEMOGLOBIN-HEMACUE: Hemoglobin: 10 g/dL — ABNORMAL LOW (ref 12.0–15.0)

## 2019-02-22 MED ORDER — EPOETIN ALFA 4000 UNIT/ML IJ SOLN: 4000.0000 [IU] | Freq: Once | INTRAMUSCULAR | Status: DC

## 2019-02-23 LAB — PTH, INTACT AND CALCIUM
Calcium, Total (PTH): 9.6 mg/dL (ref 8.7–10.3)
PTH: 48 pg/mL (ref 15–65)

## 2019-02-23 LAB — VITAMIN D 25 HYDROXY (VIT D DEFICIENCY, FRACTURES): Vit D, 25-Hydroxy: 73.9 ng/mL (ref 30.0–100.0)

## 2019-03-08 ENCOUNTER — Encounter (HOSPITAL_COMMUNITY): Payer: Self-pay

## 2019-03-08 ENCOUNTER — Other Ambulatory Visit: Payer: Self-pay

## 2019-03-08 ENCOUNTER — Encounter (HOSPITAL_COMMUNITY)
Admission: RE | Admit: 2019-03-08 | Discharge: 2019-03-08 | Disposition: A | Discharge status: Home / Self Care | Payer: Medicare Other | Source: Ambulatory Visit | Attending: Nephrology | Admitting: Nephrology

## 2019-03-08 DIAGNOSIS — D631 Anemia in chronic kidney disease: Secondary | ICD-10-CM | POA: Insufficient documentation

## 2019-03-08 DIAGNOSIS — N184 Chronic kidney disease, stage 4 (severe): Secondary | ICD-10-CM | POA: Diagnosis not present

## 2019-03-08 LAB — POCT HEMOGLOBIN-HEMACUE: Hemoglobin: 9 g/dL — ABNORMAL LOW (ref 12.0–15.0)

## 2019-03-08 MED ORDER — EPOETIN ALFA 4000 UNIT/ML IJ SOLN
4000.0000 [IU] | Freq: Once | INTRAMUSCULAR | Status: AC
  Administered 2019-03-08: 4000 [IU] via SUBCUTANEOUS

## 2019-03-08 MED ORDER — EPOETIN ALFA 4000 UNIT/ML IJ SOLN
INTRAMUSCULAR | Status: AC
  Filled 2019-03-08: qty 1

## 2019-03-22 ENCOUNTER — Other Ambulatory Visit: Payer: Self-pay

## 2019-03-22 ENCOUNTER — Encounter (HOSPITAL_COMMUNITY): Payer: Self-pay

## 2019-03-22 ENCOUNTER — Encounter (HOSPITAL_COMMUNITY)
Admission: RE | Admit: 2019-03-22 | Discharge: 2019-03-22 | Disposition: A | Discharge status: Home / Self Care | Payer: Medicare Other | Source: Ambulatory Visit | Attending: Nephrology | Admitting: Nephrology

## 2019-03-22 DIAGNOSIS — N184 Chronic kidney disease, stage 4 (severe): Secondary | ICD-10-CM | POA: Diagnosis not present

## 2019-03-22 LAB — RENAL FUNCTION PANEL
Albumin: 4.3 g/dL (ref 3.5–5.0)
Anion gap: 10 (ref 5–15)
BUN: 49 mg/dL — ABNORMAL HIGH (ref 8–23)
CO2: 23 mmol/L (ref 22–32)
Calcium: 9.5 mg/dL (ref 8.9–10.3)
Chloride: 107 mmol/L (ref 98–111)
Creatinine, Ser: 1.99 mg/dL — ABNORMAL HIGH (ref 0.44–1.00)
GFR calc Af Amer: 26 mL/min — ABNORMAL LOW (ref >60)
GFR calc non Af Amer: 22 mL/min — ABNORMAL LOW (ref >60)
Glucose, Bld: 161 mg/dL — ABNORMAL HIGH (ref 70–99)
Phosphorus: 3.6 mg/dL (ref 2.5–4.6)
Potassium: 4.4 mmol/L (ref 3.5–5.1)
Sodium: 140 mmol/L (ref 135–145)

## 2019-03-22 LAB — IRON AND TIBC
Iron: 55 ug/dL (ref 28–170)
Saturation Ratios: 19 % (ref 10.4–31.8)
TIBC: 282 ug/dL (ref 250–450)
UIBC: 227 ug/dL

## 2019-03-22 LAB — URINALYSIS, ROUTINE W REFLEX MICROSCOPIC
Bacteria, UA: NONE SEEN
Bilirubin Urine: NEGATIVE
Glucose, UA: NEGATIVE mg/dL
Hgb urine dipstick: NEGATIVE
Ketones, ur: NEGATIVE mg/dL
Nitrite: NEGATIVE
Protein, ur: NEGATIVE mg/dL
Specific Gravity, Urine: 1.012 (ref 1.005–1.030)
pH: 5 (ref 5.0–8.0)

## 2019-03-22 LAB — PROTEIN / CREATININE RATIO, URINE
Creatinine, Urine: 83.94 mg/dL
Protein Creatinine Ratio: 0.3 mg/mg{Cre} — ABNORMAL HIGH (ref 0.00–0.15)
Total Protein, Urine: 25 mg/dL

## 2019-03-22 LAB — FERRITIN: Ferritin: 615 ng/mL — ABNORMAL HIGH (ref 11–307)

## 2019-03-22 LAB — HEMOGLOBIN AND HEMATOCRIT, BLOOD
HCT: 29.6 % — ABNORMAL LOW (ref 36.0–46.0)
Hemoglobin: 9 g/dL — ABNORMAL LOW (ref 12.0–15.0)

## 2019-03-22 LAB — POCT HEMOGLOBIN-HEMACUE: Hemoglobin: 9.6 g/dL — ABNORMAL LOW (ref 12.0–15.0)

## 2019-03-22 MED ORDER — EPOETIN ALFA 4000 UNIT/ML IJ SOLN
4000.0000 [IU] | Freq: Once | INTRAMUSCULAR | Status: AC
  Administered 2019-03-22: 4000 [IU] via SUBCUTANEOUS

## 2019-03-23 LAB — PTH, INTACT AND CALCIUM
Calcium, Total (PTH): 9.5 mg/dL (ref 8.7–10.3)
PTH: 66 pg/mL — ABNORMAL HIGH (ref 15–65)

## 2019-03-23 LAB — VITAMIN D 25 HYDROXY (VIT D DEFICIENCY, FRACTURES): Vit D, 25-Hydroxy: 66.7 ng/mL (ref 30.0–100.0)

## 2019-04-05 ENCOUNTER — Encounter (HOSPITAL_COMMUNITY)
Admission: RE | Admit: 2019-04-05 | Discharge: 2019-04-05 | Disposition: A | Discharge status: Home / Self Care | Payer: Medicare Other | Source: Ambulatory Visit | Attending: Nephrology | Admitting: Nephrology

## 2019-04-05 ENCOUNTER — Other Ambulatory Visit: Payer: Self-pay

## 2019-04-05 DIAGNOSIS — D631 Anemia in chronic kidney disease: Secondary | ICD-10-CM | POA: Diagnosis present

## 2019-04-05 DIAGNOSIS — N184 Chronic kidney disease, stage 4 (severe): Secondary | ICD-10-CM | POA: Diagnosis not present

## 2019-04-05 LAB — POCT HEMOGLOBIN-HEMACUE: Hemoglobin: 8.4 g/dL — ABNORMAL LOW (ref 12.0–15.0)

## 2019-04-05 MED ORDER — EPOETIN ALFA 4000 UNIT/ML IJ SOLN
4000.0000 [IU] | Freq: Once | INTRAMUSCULAR | Status: AC
  Administered 2019-04-05: 4000 [IU] via SUBCUTANEOUS
  Filled 2019-04-05: qty 1

## 2019-04-05 NOTE — Progress Notes (Signed)
Results for Stephanie Middleton, Stephanie Middleton (MRN [id number]) as of 04/05/2019 11:13  Ref. Range 04/05/2019 11:09  Hemoglobin Latest Ref Range: 12.0 - 15.0 g/dL 8.4 (L)

## 2019-04-19 ENCOUNTER — Other Ambulatory Visit: Payer: Self-pay

## 2019-04-19 ENCOUNTER — Encounter (HOSPITAL_COMMUNITY)
Admission: RE | Admit: 2019-04-19 | Discharge: 2019-04-19 | Disposition: A | Discharge status: Home / Self Care | Payer: Medicare Other | Source: Ambulatory Visit | Attending: Nephrology | Admitting: Nephrology

## 2019-04-19 DIAGNOSIS — N184 Chronic kidney disease, stage 4 (severe): Secondary | ICD-10-CM | POA: Diagnosis not present

## 2019-04-19 LAB — POCT HEMOGLOBIN-HEMACUE: Hemoglobin: 8.6 g/dL — ABNORMAL LOW (ref 12.0–15.0)

## 2019-04-19 MED ORDER — EPOETIN ALFA 4000 UNIT/ML IJ SOLN
INTRAMUSCULAR | Status: AC
  Filled 2019-04-19: qty 1

## 2019-04-19 MED ORDER — EPOETIN ALFA 4000 UNIT/ML IJ SOLN
4000.0000 [IU] | INTRAMUSCULAR | Status: DC
  Administered 2019-04-19: 4000 [IU] via SUBCUTANEOUS

## 2019-04-19 NOTE — Progress Notes (Signed)
Results for Stephanie Middleton, Stephanie Middleton (MRN [id number]) as of 04/19/2019 10:01  Ref. Range 03/22/2019 10:31  Sodium Latest Ref Range: 135 - 145 mmol/L 140  Potassium Latest Ref Range: 3.5 - 5.1 mmol/L 4.4  Chloride Latest Ref Range: 98 - 111 mmol/L 107  CO2 Latest Ref Range: 22 - 32 mmol/L 23  Glucose Latest Ref Range: 70 - 99 mg/dL 161 (H)  BUN Latest Ref Range: 8 - 23 mg/dL 49 (H)  Creatinine Latest Ref Range: 0.44 - 1.00 mg/dL 1.99 (H)  Calcium Latest Ref Range: 8.9 - 10.3 mg/dL 9.5  Anion gap Latest Ref Range: 5 - 15  10  Phosphorus Latest Ref Range: 2.5 - 4.6 mg/dL 3.6  Albumin Latest Ref Range: 3.5 - 5.0 g/dL 4.3  GFR, Est Non African American Latest Ref Range: >60 mL/min 22 (L)  GFR, Est African American Latest Ref Range: >60 mL/min 26 (L)  Iron Latest Ref Range: 28 - 170 ug/dL 55  UIBC Latest Units: ug/dL 227  TIBC Latest Ref Range: 250 - 450 ug/dL 282  Saturation Ratios Latest Ref Range: 10.4 - 31.8 % 19  Ferritin Latest Ref Range: 11 - 307 ng/mL 615 (H)  Vitamin D, 25-Hydroxy Latest Ref Range: 30.0 - 100.0 ng/mL 66.7  Hemoglobin Latest Ref Range: 12.0 - 15.0 g/dL 9.0 (L)  HCT Latest Ref Range: 36.0 - 46.0 % 29.6 (L)  PTH, Intact Latest Ref Range: 15 - 65 pg/mL 66 (H)  Calcium, Total (PTH) Latest Ref Range: 8.7 - 10.3 mg/dL 9.5  PTH Interp Unknown Comment  URINALYSIS, ROUTINE W REFLEX MICROSCOPIC Unknown Rpt (A)  Appearance Latest Ref Range: CLEAR  CLEAR  Bilirubin Urine Latest Ref Range: NEGATIVE  NEGATIVE  Color, Urine Latest Ref Range: YELLOW  STRAW (A)  Glucose, UA Latest Ref Range: NEGATIVE mg/dL NEGATIVE  Hgb urine dipstick Latest Ref Range: NEGATIVE  NEGATIVE  Ketones, ur Latest Ref Range: NEGATIVE mg/dL NEGATIVE  Leukocytes,Ua Latest Ref Range: NEGATIVE  LARGE (A)  Nitrite Latest Ref Range: NEGATIVE  NEGATIVE  pH Latest Ref Range: 5.0 - 8.0  5.0  Protein Latest Ref Range: NEGATIVE mg/dL NEGATIVE  Specific Gravity, Urine Latest Ref Range: 1.005 - 1.030  1.012  Bacteria, UA  Latest Ref Range: NONE SEEN  NONE SEEN  Mucus Unknown PRESENT  RBC / HPF Latest Ref Range: 0 - 5 RBC/hpf 0-5  Squamous Epithelial / LPF Latest Ref Range: 0 - 5  0-5  WBC, UA Latest Ref Range: 0 - 5 WBC/hpf 11-20  Total Protein, Urine Latest Units: mg/dL 25  Protein Creatinine Ratio Latest Ref Range: 0.00 - 0.15 mg/mgCre 0.30 (H)  Creatinine, Urine Latest Units: mg/dL 83.94

## 2019-04-19 NOTE — Progress Notes (Signed)
Results for Stephanie Middleton, Stephanie Middleton (MRN [id number]) as of 04/19/2019 16:09  Ref. Range 04/19/2019 10:01  Hemoglobin Latest Ref Range: 12.0 - 15.0 g/dL 8.6 (L)

## 2019-05-03 ENCOUNTER — Encounter (HOSPITAL_COMMUNITY): Admission: RE | Admit: 2019-05-03 | Payer: Medicare Other | Source: Ambulatory Visit

## 2019-05-03 ENCOUNTER — Encounter (HOSPITAL_COMMUNITY): Payer: Medicare Other

## 2019-05-04 ENCOUNTER — Encounter (HOSPITAL_COMMUNITY)
Admission: RE | Admit: 2019-05-04 | Discharge: 2019-05-04 | Disposition: A | Discharge status: Home / Self Care | Payer: Medicare Other | Source: Ambulatory Visit | Attending: Nephrology | Admitting: Nephrology

## 2019-05-04 ENCOUNTER — Encounter (HOSPITAL_COMMUNITY): Payer: Self-pay

## 2019-05-04 ENCOUNTER — Other Ambulatory Visit: Payer: Self-pay

## 2019-05-04 DIAGNOSIS — N184 Chronic kidney disease, stage 4 (severe): Secondary | ICD-10-CM | POA: Diagnosis not present

## 2019-05-04 LAB — POCT HEMOGLOBIN-HEMACUE: Hemoglobin: 9.2 g/dL — ABNORMAL LOW (ref 12.0–15.0)

## 2019-05-04 MED ORDER — EPOETIN ALFA 4000 UNIT/ML IJ SOLN
4000.0000 [IU] | Freq: Once | INTRAMUSCULAR | Status: AC
  Administered 2019-05-04: 4000 [IU] via SUBCUTANEOUS

## 2019-05-04 MED ORDER — EPOETIN ALFA 4000 UNIT/ML IJ SOLN
INTRAMUSCULAR | Status: AC
  Filled 2019-05-04: qty 1

## 2019-05-17 ENCOUNTER — Encounter (HOSPITAL_COMMUNITY)
Admission: RE | Admit: 2019-05-17 | Discharge: 2019-05-17 | Disposition: A | Discharge status: Home / Self Care | Payer: Medicare Other | Source: Ambulatory Visit | Attending: Nephrology | Admitting: Nephrology

## 2019-05-17 ENCOUNTER — Other Ambulatory Visit: Payer: Self-pay

## 2019-05-17 DIAGNOSIS — D631 Anemia in chronic kidney disease: Secondary | ICD-10-CM | POA: Diagnosis not present

## 2019-05-17 DIAGNOSIS — N184 Chronic kidney disease, stage 4 (severe): Secondary | ICD-10-CM | POA: Insufficient documentation

## 2019-05-17 LAB — POCT HEMOGLOBIN-HEMACUE: Hemoglobin: 9.2 g/dL — ABNORMAL LOW (ref 12.0–15.0)

## 2019-05-17 MED ORDER — EPOETIN ALFA 4000 UNIT/ML IJ SOLN
4000.0000 [IU] | Freq: Once | INTRAMUSCULAR | Status: AC
  Administered 2019-05-17: 4000 [IU] via SUBCUTANEOUS

## 2019-05-17 MED ORDER — EPOETIN ALFA 4000 UNIT/ML IJ SOLN
INTRAMUSCULAR | Status: AC
  Filled 2019-05-17: qty 1

## 2019-05-17 NOTE — Progress Notes (Signed)
Results for Stephanie Middleton, Stephanie Middleton (MRN [id number]) as of 05/17/2019 13:08  Ref. Range 05/17/2019 12:46  Hemoglobin Latest Ref Range: 12.0 - 15.0 g/dL 9.2 (L)

## 2019-05-18 ENCOUNTER — Other Ambulatory Visit (HOSPITAL_COMMUNITY): Payer: Medicare Other

## 2019-05-18 ENCOUNTER — Ambulatory Visit (HOSPITAL_COMMUNITY): Payer: Medicare Other

## 2019-05-31 ENCOUNTER — Other Ambulatory Visit: Payer: Self-pay

## 2019-05-31 ENCOUNTER — Encounter (HOSPITAL_COMMUNITY)
Admission: RE | Admit: 2019-05-31 | Discharge: 2019-05-31 | Disposition: A | Discharge status: Home / Self Care | Payer: Medicare Other | Source: Ambulatory Visit | Attending: Nephrology | Admitting: Nephrology

## 2019-05-31 DIAGNOSIS — N184 Chronic kidney disease, stage 4 (severe): Secondary | ICD-10-CM | POA: Diagnosis not present

## 2019-05-31 LAB — POCT HEMOGLOBIN-HEMACUE: Hemoglobin: 8.7 g/dL — ABNORMAL LOW (ref 12.0–15.0)

## 2019-05-31 MED ORDER — EPOETIN ALFA 4000 UNIT/ML IJ SOLN
4000.0000 [IU] | Freq: Once | INTRAMUSCULAR | Status: AC
  Administered 2019-05-31: 4000 [IU] via SUBCUTANEOUS
  Filled 2019-05-31: qty 1

## 2019-06-14 ENCOUNTER — Encounter (HOSPITAL_COMMUNITY)
Admission: RE | Admit: 2019-06-14 | Discharge: 2019-06-14 | Disposition: A | Discharge status: Home / Self Care | Payer: Medicare Other | Source: Ambulatory Visit | Attending: Nephrology | Admitting: Nephrology

## 2019-06-14 ENCOUNTER — Other Ambulatory Visit: Payer: Self-pay

## 2019-06-14 ENCOUNTER — Encounter (HOSPITAL_COMMUNITY): Payer: Self-pay

## 2019-06-14 DIAGNOSIS — Z79899 Other long term (current) drug therapy: Secondary | ICD-10-CM | POA: Diagnosis not present

## 2019-06-14 DIAGNOSIS — E559 Vitamin D deficiency, unspecified: Secondary | ICD-10-CM | POA: Insufficient documentation

## 2019-06-14 DIAGNOSIS — N184 Chronic kidney disease, stage 4 (severe): Secondary | ICD-10-CM | POA: Insufficient documentation

## 2019-06-14 DIAGNOSIS — R809 Proteinuria, unspecified: Secondary | ICD-10-CM | POA: Insufficient documentation

## 2019-06-14 DIAGNOSIS — D649 Anemia, unspecified: Secondary | ICD-10-CM | POA: Diagnosis not present

## 2019-06-14 LAB — POCT HEMOGLOBIN-HEMACUE: Hemoglobin: 8.8 g/dL — ABNORMAL LOW (ref 12.0–15.0)

## 2019-06-14 MED ORDER — EPOETIN ALFA 4000 UNIT/ML IJ SOLN
4000.0000 [IU] | Freq: Once | INTRAMUSCULAR | Status: AC
  Administered 2019-06-14: 4000 [IU] via SUBCUTANEOUS

## 2019-06-14 MED ORDER — EPOETIN ALFA 4000 UNIT/ML IJ SOLN
INTRAMUSCULAR | Status: AC
  Filled 2019-06-14: qty 1

## 2019-06-28 ENCOUNTER — Encounter (HOSPITAL_COMMUNITY)
Admission: RE | Admit: 2019-06-28 | Discharge: 2019-06-28 | Disposition: A | Discharge status: Home / Self Care | Payer: Medicare Other | Source: Ambulatory Visit | Attending: Nephrology | Admitting: Nephrology

## 2019-06-28 ENCOUNTER — Other Ambulatory Visit: Payer: Self-pay

## 2019-06-28 ENCOUNTER — Encounter (HOSPITAL_COMMUNITY): Payer: Self-pay

## 2019-06-28 DIAGNOSIS — D649 Anemia, unspecified: Secondary | ICD-10-CM | POA: Diagnosis not present

## 2019-06-28 LAB — HEMOGLOBIN AND HEMATOCRIT, BLOOD
HCT: 30.5 % — ABNORMAL LOW (ref 36.0–46.0)
Hemoglobin: 9.2 g/dL — ABNORMAL LOW (ref 12.0–15.0)

## 2019-06-28 LAB — RENAL FUNCTION PANEL
Albumin: 4.2 g/dL (ref 3.5–5.0)
Anion gap: 10 (ref 5–15)
BUN: 44 mg/dL — ABNORMAL HIGH (ref 8–23)
CO2: 24 mmol/L (ref 22–32)
Calcium: 9.4 mg/dL (ref 8.9–10.3)
Chloride: 107 mmol/L (ref 98–111)
Creatinine, Ser: 2.28 mg/dL — ABNORMAL HIGH (ref 0.44–1.00)
GFR calc Af Amer: 22 mL/min — ABNORMAL LOW (ref >60)
GFR calc non Af Amer: 19 mL/min — ABNORMAL LOW (ref >60)
Glucose, Bld: 188 mg/dL — ABNORMAL HIGH (ref 70–99)
Phosphorus: 3.9 mg/dL (ref 2.5–4.6)
Potassium: 4.1 mmol/L (ref 3.5–5.1)
Sodium: 141 mmol/L (ref 135–145)

## 2019-06-28 LAB — IRON AND TIBC
Iron: 56 ug/dL (ref 28–170)
Saturation Ratios: 19 % (ref 10.4–31.8)
TIBC: 288 ug/dL (ref 250–450)
UIBC: 232 ug/dL

## 2019-06-28 LAB — PROTEIN / CREATININE RATIO, URINE
Creatinine, Urine: 51.08 mg/dL
Protein Creatinine Ratio: 0.76 mg/mg{Cre} — ABNORMAL HIGH (ref 0.00–0.15)
Total Protein, Urine: 39 mg/dL

## 2019-06-28 LAB — POCT HEMOGLOBIN-HEMACUE: Hemoglobin: 9.1 g/dL — ABNORMAL LOW (ref 12.0–15.0)

## 2019-06-28 LAB — FERRITIN: Ferritin: 559 ng/mL — ABNORMAL HIGH (ref 11–307)

## 2019-06-28 MED ORDER — EPOETIN ALFA 4000 UNIT/ML IJ SOLN
4000.0000 [IU] | Freq: Once | INTRAMUSCULAR | Status: AC
  Administered 2019-06-28: 4000 [IU] via SUBCUTANEOUS
  Filled 2019-06-28: qty 1

## 2019-06-29 LAB — PTH, INTACT AND CALCIUM
Calcium, Total (PTH): 9.5 mg/dL (ref 8.7–10.3)
PTH: 58 pg/mL (ref 15–65)

## 2019-06-29 LAB — VITAMIN D 25 HYDROXY (VIT D DEFICIENCY, FRACTURES): Vit D, 25-Hydroxy: 61 ng/mL (ref 30.0–100.0)

## 2019-07-12 ENCOUNTER — Encounter (HOSPITAL_COMMUNITY)
Admission: RE | Admit: 2019-07-12 | Discharge: 2019-07-12 | Disposition: A | Discharge status: Home / Self Care | Payer: Medicare Other | Source: Ambulatory Visit | Attending: Nephrology | Admitting: Nephrology

## 2019-07-12 ENCOUNTER — Other Ambulatory Visit: Payer: Self-pay

## 2019-07-12 ENCOUNTER — Encounter (HOSPITAL_COMMUNITY): Payer: Self-pay

## 2019-07-12 DIAGNOSIS — N184 Chronic kidney disease, stage 4 (severe): Secondary | ICD-10-CM | POA: Insufficient documentation

## 2019-07-12 DIAGNOSIS — D631 Anemia in chronic kidney disease: Secondary | ICD-10-CM | POA: Insufficient documentation

## 2019-07-12 LAB — POCT HEMOGLOBIN-HEMACUE: Hemoglobin: 8.9 g/dL — ABNORMAL LOW (ref 12.0–15.0)

## 2019-07-12 MED ORDER — EPOETIN ALFA 4000 UNIT/ML IJ SOLN
INTRAMUSCULAR | Status: AC
  Filled 2019-07-12: qty 1

## 2019-07-12 MED ORDER — EPOETIN ALFA 4000 UNIT/ML IJ SOLN
4000.0000 [IU] | INTRAMUSCULAR | Status: DC
  Administered 2019-07-12: 4000 [IU] via SUBCUTANEOUS

## 2019-07-26 ENCOUNTER — Other Ambulatory Visit: Payer: Self-pay

## 2019-07-26 ENCOUNTER — Encounter (HOSPITAL_COMMUNITY)
Admission: RE | Admit: 2019-07-26 | Discharge: 2019-07-26 | Disposition: A | Discharge status: Home / Self Care | Payer: Medicare Other | Source: Ambulatory Visit | Attending: Nephrology | Admitting: Nephrology

## 2019-07-26 ENCOUNTER — Encounter (HOSPITAL_COMMUNITY): Payer: Self-pay

## 2019-07-26 DIAGNOSIS — N184 Chronic kidney disease, stage 4 (severe): Secondary | ICD-10-CM | POA: Diagnosis not present

## 2019-07-26 LAB — POCT HEMOGLOBIN-HEMACUE: Hemoglobin: 9 g/dL — ABNORMAL LOW (ref 12.0–15.0)

## 2019-07-26 MED ORDER — EPOETIN ALFA 4000 UNIT/ML IJ SOLN
INTRAMUSCULAR | Status: AC
  Filled 2019-07-26: qty 1

## 2019-07-26 MED ORDER — EPOETIN ALFA 4000 UNIT/ML IJ SOLN
4000.0000 [IU] | Freq: Once | INTRAMUSCULAR | Status: AC
  Administered 2019-07-26: 4000 [IU] via SUBCUTANEOUS

## 2019-08-09 ENCOUNTER — Encounter (HOSPITAL_COMMUNITY)
Admission: RE | Admit: 2019-08-09 | Discharge: 2019-08-09 | Disposition: A | Discharge status: Home / Self Care | Payer: Medicare Other | Source: Ambulatory Visit | Attending: Nephrology | Admitting: Nephrology

## 2019-08-09 ENCOUNTER — Encounter (HOSPITAL_COMMUNITY): Payer: Self-pay

## 2019-08-09 ENCOUNTER — Other Ambulatory Visit: Payer: Self-pay

## 2019-08-09 DIAGNOSIS — N184 Chronic kidney disease, stage 4 (severe): Secondary | ICD-10-CM | POA: Insufficient documentation

## 2019-08-09 DIAGNOSIS — D631 Anemia in chronic kidney disease: Secondary | ICD-10-CM | POA: Insufficient documentation

## 2019-08-09 LAB — POCT HEMOGLOBIN-HEMACUE: Hemoglobin: 8.8 g/dL — ABNORMAL LOW (ref 12.0–15.0)

## 2019-08-09 MED ORDER — EPOETIN ALFA 4000 UNIT/ML IJ SOLN
4000.0000 [IU] | Freq: Once | INTRAMUSCULAR | Status: AC
  Administered 2019-08-09: 4000 [IU] via SUBCUTANEOUS

## 2019-08-09 MED ORDER — EPOETIN ALFA 4000 UNIT/ML IJ SOLN
INTRAMUSCULAR | Status: AC
  Filled 2019-08-09: qty 1

## 2019-08-23 ENCOUNTER — Encounter (HOSPITAL_COMMUNITY): Payer: Self-pay

## 2019-08-23 ENCOUNTER — Other Ambulatory Visit: Payer: Self-pay

## 2019-08-23 ENCOUNTER — Encounter (HOSPITAL_COMMUNITY)
Admission: RE | Admit: 2019-08-23 | Discharge: 2019-08-23 | Disposition: A | Discharge status: Home / Self Care | Payer: Medicare Other | Source: Ambulatory Visit | Attending: Nephrology | Admitting: Nephrology

## 2019-08-23 DIAGNOSIS — N184 Chronic kidney disease, stage 4 (severe): Secondary | ICD-10-CM | POA: Diagnosis not present

## 2019-08-23 LAB — POCT HEMOGLOBIN-HEMACUE: Hemoglobin: 8.6 g/dL — ABNORMAL LOW (ref 12.0–15.0)

## 2019-08-23 MED ORDER — EPOETIN ALFA 4000 UNIT/ML IJ SOLN
4000.0000 [IU] | Freq: Once | INTRAMUSCULAR | Status: AC
  Administered 2019-08-23: 4000 [IU] via SUBCUTANEOUS
  Filled 2019-08-23: qty 1

## 2019-09-06 ENCOUNTER — Encounter (HOSPITAL_COMMUNITY)
Admission: RE | Admit: 2019-09-06 | Discharge: 2019-09-06 | Disposition: A | Discharge status: Home / Self Care | Payer: Medicare Other | Source: Ambulatory Visit | Attending: Nephrology | Admitting: Nephrology

## 2019-09-06 ENCOUNTER — Other Ambulatory Visit: Payer: Self-pay

## 2019-09-06 ENCOUNTER — Encounter (HOSPITAL_COMMUNITY): Payer: Self-pay

## 2019-09-06 DIAGNOSIS — D631 Anemia in chronic kidney disease: Secondary | ICD-10-CM | POA: Diagnosis not present

## 2019-09-06 DIAGNOSIS — N2581 Secondary hyperparathyroidism of renal origin: Secondary | ICD-10-CM | POA: Diagnosis not present

## 2019-09-06 DIAGNOSIS — E1122 Type 2 diabetes mellitus with diabetic chronic kidney disease: Secondary | ICD-10-CM | POA: Insufficient documentation

## 2019-09-06 DIAGNOSIS — N184 Chronic kidney disease, stage 4 (severe): Secondary | ICD-10-CM | POA: Diagnosis not present

## 2019-09-06 LAB — CBC
HCT: 29.7 % — ABNORMAL LOW (ref 36.0–46.0)
Hemoglobin: 9 g/dL — ABNORMAL LOW (ref 12.0–15.0)
MCH: 31 pg (ref 26.0–34.0)
MCHC: 30.3 g/dL (ref 30.0–36.0)
MCV: 102.4 fL — ABNORMAL HIGH (ref 80.0–100.0)
Platelets: 186 10*3/uL (ref 150–400)
RBC: 2.9 MIL/uL — ABNORMAL LOW (ref 3.87–5.11)
RDW: 14 % (ref 11.5–15.5)
WBC: 4.1 10*3/uL (ref 4.0–10.5)
nRBC: 0 % (ref 0.0–0.2)

## 2019-09-06 LAB — PROTEIN / CREATININE RATIO, URINE
Creatinine, Urine: 66.81 mg/dL
Protein Creatinine Ratio: 0.57 mg/mg{Cre} — ABNORMAL HIGH (ref 0.00–0.15)
Total Protein, Urine: 38 mg/dL

## 2019-09-06 LAB — RENAL FUNCTION PANEL
Albumin: 4.3 g/dL (ref 3.5–5.0)
Anion gap: 12 (ref 5–15)
BUN: 45 mg/dL — ABNORMAL HIGH (ref 8–23)
CO2: 23 mmol/L (ref 22–32)
Calcium: 9.5 mg/dL (ref 8.9–10.3)
Chloride: 105 mmol/L (ref 98–111)
Creatinine, Ser: 2.28 mg/dL — ABNORMAL HIGH (ref 0.44–1.00)
GFR calc Af Amer: 22 mL/min — ABNORMAL LOW (ref >60)
GFR calc non Af Amer: 19 mL/min — ABNORMAL LOW (ref >60)
Glucose, Bld: 236 mg/dL — ABNORMAL HIGH (ref 70–99)
Phosphorus: 3.9 mg/dL (ref 2.5–4.6)
Potassium: 4.4 mmol/L (ref 3.5–5.1)
Sodium: 140 mmol/L (ref 135–145)

## 2019-09-06 LAB — IRON AND TIBC
Iron: 60 ug/dL (ref 28–170)
Saturation Ratios: 21 % (ref 10.4–31.8)
TIBC: 283 ug/dL (ref 250–450)
UIBC: 223 ug/dL

## 2019-09-06 LAB — VITAMIN D 25 HYDROXY (VIT D DEFICIENCY, FRACTURES): Vit D, 25-Hydroxy: 71.95 ng/mL (ref 30–100)

## 2019-09-06 LAB — POCT HEMOGLOBIN-HEMACUE: Hemoglobin: 8.9 g/dL — ABNORMAL LOW (ref 12.0–15.0)

## 2019-09-06 LAB — FERRITIN: Ferritin: 574 ng/mL — ABNORMAL HIGH (ref 11–307)

## 2019-09-06 MED ORDER — EPOETIN ALFA 4000 UNIT/ML IJ SOLN
4000.0000 [IU] | Freq: Once | INTRAMUSCULAR | Status: AC
  Administered 2019-09-06: 4000 [IU] via SUBCUTANEOUS
  Filled 2019-09-06: qty 1

## 2019-09-07 LAB — PARATHYROID HORMONE, INTACT (NO CA): PTH: 57 pg/mL (ref 15–65)

## 2019-09-20 ENCOUNTER — Encounter (HOSPITAL_COMMUNITY): Payer: Medicare Other

## 2019-09-20 ENCOUNTER — Encounter (HOSPITAL_COMMUNITY): Admission: RE | Admit: 2019-09-20 | Payer: Medicare Other | Source: Ambulatory Visit

## 2019-09-21 ENCOUNTER — Encounter (HOSPITAL_COMMUNITY)
Admission: RE | Admit: 2019-09-21 | Discharge: 2019-09-21 | Disposition: A | Discharge status: Home / Self Care | Payer: Medicare Other | Source: Ambulatory Visit | Attending: Nephrology | Admitting: Nephrology

## 2019-09-21 ENCOUNTER — Encounter (HOSPITAL_COMMUNITY): Payer: Self-pay

## 2019-09-21 ENCOUNTER — Other Ambulatory Visit: Payer: Self-pay

## 2019-09-21 DIAGNOSIS — E1122 Type 2 diabetes mellitus with diabetic chronic kidney disease: Secondary | ICD-10-CM | POA: Diagnosis not present

## 2019-09-21 LAB — POCT HEMOGLOBIN-HEMACUE: Hemoglobin: 8.6 g/dL — ABNORMAL LOW (ref 12.0–15.0)

## 2019-09-21 MED ORDER — EPOETIN ALFA 4000 UNIT/ML IJ SOLN
INTRAMUSCULAR | Status: AC
  Filled 2019-09-21: qty 1

## 2019-09-21 MED ORDER — EPOETIN ALFA 2000 UNIT/ML IJ SOLN
4000.0000 [IU] | Freq: Once | INTRAMUSCULAR | Status: AC
  Administered 2019-09-21: 4000 [IU] via SUBCUTANEOUS

## 2019-10-11 ENCOUNTER — Encounter (HOSPITAL_COMMUNITY): Payer: Self-pay

## 2019-10-11 ENCOUNTER — Encounter (HOSPITAL_COMMUNITY)
Admission: RE | Admit: 2019-10-11 | Discharge: 2019-10-11 | Disposition: A | Discharge status: Home / Self Care | Payer: Medicare PPO | Source: Ambulatory Visit | Attending: Nephrology | Admitting: Nephrology

## 2019-10-11 ENCOUNTER — Other Ambulatory Visit: Payer: Self-pay

## 2019-10-11 DIAGNOSIS — N184 Chronic kidney disease, stage 4 (severe): Secondary | ICD-10-CM | POA: Insufficient documentation

## 2019-10-11 DIAGNOSIS — D631 Anemia in chronic kidney disease: Secondary | ICD-10-CM | POA: Insufficient documentation

## 2019-10-11 LAB — POCT HEMOGLOBIN-HEMACUE: Hemoglobin: 8.8 g/dL — ABNORMAL LOW (ref 12.0–15.0)

## 2019-10-11 MED ORDER — EPOETIN ALFA 4000 UNIT/ML IJ SOLN
4000.0000 [IU] | Freq: Once | INTRAMUSCULAR | Status: AC
  Administered 2019-10-11: 4000 [IU] via SUBCUTANEOUS
  Filled 2019-10-11: qty 1

## 2019-10-25 ENCOUNTER — Encounter (HOSPITAL_COMMUNITY)
Admission: RE | Admit: 2019-10-25 | Discharge: 2019-10-25 | Disposition: A | Discharge status: Home / Self Care | Payer: Medicare PPO | Source: Ambulatory Visit | Attending: Nephrology | Admitting: Nephrology

## 2019-10-25 ENCOUNTER — Other Ambulatory Visit: Payer: Self-pay

## 2019-10-25 ENCOUNTER — Encounter (HOSPITAL_COMMUNITY): Payer: Self-pay

## 2019-10-25 DIAGNOSIS — D631 Anemia in chronic kidney disease: Secondary | ICD-10-CM | POA: Diagnosis not present

## 2019-10-25 DIAGNOSIS — N184 Chronic kidney disease, stage 4 (severe): Secondary | ICD-10-CM | POA: Diagnosis not present

## 2019-10-25 LAB — POCT HEMOGLOBIN-HEMACUE: Hemoglobin: 8.6 g/dL — ABNORMAL LOW (ref 12.0–15.0)

## 2019-10-25 MED ORDER — EPOETIN ALFA 4000 UNIT/ML IJ SOLN
INTRAMUSCULAR | Status: AC
  Filled 2019-10-25: qty 1

## 2019-10-25 MED ORDER — EPOETIN ALFA 4000 UNIT/ML IJ SOLN
4000.0000 [IU] | Freq: Once | INTRAMUSCULAR | Status: AC
  Administered 2019-10-25: 4000 [IU] via SUBCUTANEOUS

## 2019-11-08 ENCOUNTER — Encounter (HOSPITAL_COMMUNITY)
Admission: RE | Admit: 2019-11-08 | Discharge: 2019-11-08 | Disposition: A | Discharge status: Home / Self Care | Payer: Medicare PPO | Source: Ambulatory Visit | Attending: Nephrology | Admitting: Nephrology

## 2019-11-08 ENCOUNTER — Other Ambulatory Visit: Payer: Self-pay

## 2019-11-08 ENCOUNTER — Encounter (HOSPITAL_COMMUNITY): Payer: Self-pay

## 2019-11-08 DIAGNOSIS — E211 Secondary hyperparathyroidism, not elsewhere classified: Secondary | ICD-10-CM | POA: Insufficient documentation

## 2019-11-08 DIAGNOSIS — E559 Vitamin D deficiency, unspecified: Secondary | ICD-10-CM | POA: Diagnosis not present

## 2019-11-08 DIAGNOSIS — E1122 Type 2 diabetes mellitus with diabetic chronic kidney disease: Secondary | ICD-10-CM | POA: Diagnosis not present

## 2019-11-08 DIAGNOSIS — R809 Proteinuria, unspecified: Secondary | ICD-10-CM | POA: Diagnosis not present

## 2019-11-08 DIAGNOSIS — N184 Chronic kidney disease, stage 4 (severe): Secondary | ICD-10-CM | POA: Insufficient documentation

## 2019-11-08 DIAGNOSIS — D631 Anemia in chronic kidney disease: Secondary | ICD-10-CM | POA: Diagnosis not present

## 2019-11-08 LAB — POCT HEMOGLOBIN-HEMACUE: Hemoglobin: 8.4 g/dL — ABNORMAL LOW (ref 12.0–15.0)

## 2019-11-08 MED ORDER — EPOETIN ALFA 4000 UNIT/ML IJ SOLN
4000.0000 [IU] | Freq: Once | INTRAMUSCULAR | Status: AC
  Administered 2019-11-08: 4000 [IU] via SUBCUTANEOUS
  Filled 2019-11-08: qty 1

## 2019-11-22 ENCOUNTER — Other Ambulatory Visit: Payer: Self-pay

## 2019-11-22 ENCOUNTER — Encounter (HOSPITAL_COMMUNITY): Payer: Self-pay

## 2019-11-22 ENCOUNTER — Encounter (HOSPITAL_COMMUNITY)
Admission: RE | Admit: 2019-11-22 | Discharge: 2019-11-22 | Disposition: A | Discharge status: Home / Self Care | Payer: Medicare PPO | Source: Ambulatory Visit | Attending: Nephrology | Admitting: Nephrology

## 2019-11-22 DIAGNOSIS — E211 Secondary hyperparathyroidism, not elsewhere classified: Secondary | ICD-10-CM | POA: Diagnosis not present

## 2019-11-22 DIAGNOSIS — E1122 Type 2 diabetes mellitus with diabetic chronic kidney disease: Secondary | ICD-10-CM | POA: Diagnosis not present

## 2019-11-22 DIAGNOSIS — N184 Chronic kidney disease, stage 4 (severe): Secondary | ICD-10-CM | POA: Diagnosis not present

## 2019-11-22 DIAGNOSIS — R809 Proteinuria, unspecified: Secondary | ICD-10-CM | POA: Diagnosis not present

## 2019-11-22 DIAGNOSIS — D631 Anemia in chronic kidney disease: Secondary | ICD-10-CM | POA: Diagnosis not present

## 2019-11-22 DIAGNOSIS — E559 Vitamin D deficiency, unspecified: Secondary | ICD-10-CM | POA: Diagnosis not present

## 2019-11-22 LAB — RENAL FUNCTION PANEL
Albumin: 4.4 g/dL (ref 3.5–5.0)
Anion gap: 11 (ref 5–15)
BUN: 58 mg/dL — ABNORMAL HIGH (ref 8–23)
CO2: 25 mmol/L (ref 22–32)
Calcium: 9.6 mg/dL (ref 8.9–10.3)
Chloride: 104 mmol/L (ref 98–111)
Creatinine, Ser: 2.47 mg/dL — ABNORMAL HIGH (ref 0.44–1.00)
GFR calc Af Amer: 20 mL/min — ABNORMAL LOW (ref >60)
GFR calc non Af Amer: 17 mL/min — ABNORMAL LOW (ref >60)
Glucose, Bld: 157 mg/dL — ABNORMAL HIGH (ref 70–99)
Phosphorus: 4 mg/dL (ref 2.5–4.6)
Potassium: 4.6 mmol/L (ref 3.5–5.1)
Sodium: 140 mmol/L (ref 135–145)

## 2019-11-22 LAB — PROTEIN / CREATININE RATIO, URINE
Creatinine, Urine: 70.92 mg/dL
Protein Creatinine Ratio: 0.45 mg/mg{Cre} — ABNORMAL HIGH (ref 0.00–0.15)
Total Protein, Urine: 32 mg/dL

## 2019-11-22 LAB — CBC
HCT: 29.4 % — ABNORMAL LOW (ref 36.0–46.0)
Hemoglobin: 8.9 g/dL — ABNORMAL LOW (ref 12.0–15.0)
MCH: 30.5 pg (ref 26.0–34.0)
MCHC: 30.3 g/dL (ref 30.0–36.0)
MCV: 100.7 fL — ABNORMAL HIGH (ref 80.0–100.0)
Platelets: 212 10*3/uL (ref 150–400)
RBC: 2.92 MIL/uL — ABNORMAL LOW (ref 3.87–5.11)
RDW: 13.8 % (ref 11.5–15.5)
WBC: 4.3 10*3/uL (ref 4.0–10.5)
nRBC: 0 % (ref 0.0–0.2)

## 2019-11-22 LAB — IRON AND TIBC
Iron: 64 ug/dL (ref 28–170)
Saturation Ratios: 21 % (ref 10.4–31.8)
TIBC: 298 ug/dL (ref 250–450)
UIBC: 234 ug/dL

## 2019-11-22 LAB — VITAMIN D 25 HYDROXY (VIT D DEFICIENCY, FRACTURES): Vit D, 25-Hydroxy: 66.06 ng/mL (ref 30–100)

## 2019-11-22 LAB — POCT HEMOGLOBIN-HEMACUE: Hemoglobin: 9 g/dL — ABNORMAL LOW (ref 12.0–15.0)

## 2019-11-22 LAB — FERRITIN: Ferritin: 690 ng/mL — ABNORMAL HIGH (ref 11–307)

## 2019-11-22 MED ORDER — EPOETIN ALFA 4000 UNIT/ML IJ SOLN
4000.0000 [IU] | Freq: Once | INTRAMUSCULAR | Status: AC
  Administered 2019-11-22: 4000 [IU] via SUBCUTANEOUS

## 2019-11-22 MED ORDER — EPOETIN ALFA 4000 UNIT/ML IJ SOLN
INTRAMUSCULAR | Status: AC
  Filled 2019-11-22: qty 1

## 2019-11-23 LAB — PARATHYROID HORMONE, INTACT (NO CA): PTH: 71 pg/mL — ABNORMAL HIGH (ref 15–65)

## 2019-12-01 DIAGNOSIS — E1129 Type 2 diabetes mellitus with other diabetic kidney complication: Secondary | ICD-10-CM | POA: Diagnosis not present

## 2019-12-01 DIAGNOSIS — Z79899 Other long term (current) drug therapy: Secondary | ICD-10-CM | POA: Diagnosis not present

## 2019-12-01 DIAGNOSIS — N189 Chronic kidney disease, unspecified: Secondary | ICD-10-CM | POA: Diagnosis not present

## 2019-12-01 DIAGNOSIS — E211 Secondary hyperparathyroidism, not elsewhere classified: Secondary | ICD-10-CM | POA: Diagnosis not present

## 2019-12-01 DIAGNOSIS — D631 Anemia in chronic kidney disease: Secondary | ICD-10-CM | POA: Diagnosis not present

## 2019-12-01 DIAGNOSIS — E559 Vitamin D deficiency, unspecified: Secondary | ICD-10-CM | POA: Diagnosis not present

## 2019-12-01 DIAGNOSIS — E1122 Type 2 diabetes mellitus with diabetic chronic kidney disease: Secondary | ICD-10-CM | POA: Diagnosis not present

## 2019-12-01 DIAGNOSIS — R809 Proteinuria, unspecified: Secondary | ICD-10-CM | POA: Diagnosis not present

## 2019-12-06 ENCOUNTER — Other Ambulatory Visit: Payer: Self-pay

## 2019-12-06 ENCOUNTER — Encounter (HOSPITAL_COMMUNITY): Payer: Self-pay

## 2019-12-06 ENCOUNTER — Encounter (HOSPITAL_COMMUNITY)
Admission: RE | Admit: 2019-12-06 | Discharge: 2019-12-06 | Disposition: A | Discharge status: Home / Self Care | Payer: Medicare PPO | Source: Ambulatory Visit | Attending: Nephrology | Admitting: Nephrology

## 2019-12-06 DIAGNOSIS — D631 Anemia in chronic kidney disease: Secondary | ICD-10-CM | POA: Diagnosis not present

## 2019-12-06 DIAGNOSIS — N189 Chronic kidney disease, unspecified: Secondary | ICD-10-CM | POA: Diagnosis not present

## 2019-12-06 LAB — POCT HEMOGLOBIN-HEMACUE: Hemoglobin: 8.6 g/dL — ABNORMAL LOW (ref 12.0–15.0)

## 2019-12-06 MED ORDER — EPOETIN ALFA 3000 UNIT/ML IJ SOLN
INTRAMUSCULAR | Status: AC
  Filled 2019-12-06: qty 2

## 2019-12-06 MED ORDER — EPOETIN ALFA 3000 UNIT/ML IJ SOLN
3000.0000 [IU] | Freq: Once | INTRAMUSCULAR | Status: AC
  Administered 2019-12-06: 6000 [IU] via SUBCUTANEOUS

## 2019-12-20 ENCOUNTER — Other Ambulatory Visit: Payer: Self-pay

## 2019-12-20 ENCOUNTER — Encounter (HOSPITAL_COMMUNITY)
Admission: RE | Admit: 2019-12-20 | Discharge: 2019-12-20 | Disposition: A | Discharge status: Home / Self Care | Payer: Medicare PPO | Source: Ambulatory Visit | Attending: Nephrology | Admitting: Nephrology

## 2019-12-20 ENCOUNTER — Encounter (HOSPITAL_COMMUNITY): Payer: Self-pay

## 2019-12-20 DIAGNOSIS — D631 Anemia in chronic kidney disease: Secondary | ICD-10-CM | POA: Diagnosis not present

## 2019-12-20 DIAGNOSIS — N189 Chronic kidney disease, unspecified: Secondary | ICD-10-CM | POA: Diagnosis not present

## 2019-12-20 LAB — POCT HEMOGLOBIN-HEMACUE: Hemoglobin: 9.4 g/dL — ABNORMAL LOW (ref 12.0–15.0)

## 2019-12-20 MED ORDER — EPOETIN ALFA 3000 UNIT/ML IJ SOLN
6000.0000 [IU] | Freq: Once | INTRAMUSCULAR | Status: AC
  Administered 2019-12-20: 6000 [IU] via SUBCUTANEOUS
  Filled 2019-12-20: qty 2

## 2020-01-02 DIAGNOSIS — E1169 Type 2 diabetes mellitus with other specified complication: Secondary | ICD-10-CM | POA: Diagnosis not present

## 2020-01-02 DIAGNOSIS — N184 Chronic kidney disease, stage 4 (severe): Secondary | ICD-10-CM | POA: Diagnosis not present

## 2020-01-02 DIAGNOSIS — I10 Essential (primary) hypertension: Secondary | ICD-10-CM | POA: Diagnosis not present

## 2020-01-02 DIAGNOSIS — Z0000 Encounter for general adult medical examination without abnormal findings: Secondary | ICD-10-CM | POA: Diagnosis not present

## 2020-01-03 ENCOUNTER — Other Ambulatory Visit: Payer: Self-pay

## 2020-01-03 ENCOUNTER — Encounter (HOSPITAL_COMMUNITY)
Admission: RE | Admit: 2020-01-03 | Discharge: 2020-01-03 | Disposition: A | Discharge status: Home / Self Care | Payer: Medicare PPO | Source: Ambulatory Visit | Attending: Nephrology | Admitting: Nephrology

## 2020-01-03 ENCOUNTER — Encounter (HOSPITAL_COMMUNITY): Payer: Self-pay

## 2020-01-03 DIAGNOSIS — D631 Anemia in chronic kidney disease: Secondary | ICD-10-CM | POA: Diagnosis not present

## 2020-01-03 DIAGNOSIS — N189 Chronic kidney disease, unspecified: Secondary | ICD-10-CM | POA: Diagnosis not present

## 2020-01-03 LAB — POCT HEMOGLOBIN-HEMACUE: Hemoglobin: 8.7 g/dL — ABNORMAL LOW (ref 12.0–15.0)

## 2020-01-03 MED ORDER — EPOETIN ALFA 3000 UNIT/ML IJ SOLN
6000.0000 [IU] | Freq: Once | INTRAMUSCULAR | Status: AC
  Administered 2020-01-03: 6000 [IU] via SUBCUTANEOUS

## 2020-01-03 MED ORDER — EPOETIN ALFA 3000 UNIT/ML IJ SOLN
INTRAMUSCULAR | Status: AC
  Filled 2020-01-03: qty 2

## 2020-01-17 ENCOUNTER — Encounter (HOSPITAL_COMMUNITY)
Admission: RE | Admit: 2020-01-17 | Discharge: 2020-01-17 | Disposition: A | Discharge status: Home / Self Care | Payer: Medicare PPO | Source: Ambulatory Visit | Attending: Nephrology | Admitting: Nephrology

## 2020-01-17 ENCOUNTER — Other Ambulatory Visit: Payer: Self-pay

## 2020-01-17 DIAGNOSIS — N184 Chronic kidney disease, stage 4 (severe): Secondary | ICD-10-CM | POA: Insufficient documentation

## 2020-01-17 DIAGNOSIS — D631 Anemia in chronic kidney disease: Secondary | ICD-10-CM | POA: Diagnosis not present

## 2020-01-17 LAB — POCT HEMOGLOBIN-HEMACUE: Hemoglobin: 8.9 g/dL — ABNORMAL LOW (ref 12.0–15.0)

## 2020-01-17 MED ORDER — EPOETIN ALFA 3000 UNIT/ML IJ SOLN
6000.0000 [IU] | Freq: Once | INTRAMUSCULAR | Status: AC
  Administered 2020-01-17: 6000 [IU] via SUBCUTANEOUS

## 2020-01-17 MED ORDER — EPOETIN ALFA 3000 UNIT/ML IJ SOLN
INTRAMUSCULAR | Status: AC
  Filled 2020-01-17: qty 2

## 2020-01-17 NOTE — Progress Notes (Signed)
Rx for labs to be drawn on 01/31/20 placed in chart. Talked with daughter, Lattie Haw. Pt to be NPO after midnight for lipid panel blood draw on 01/31/20. Voiced understanding.

## 2020-01-31 ENCOUNTER — Other Ambulatory Visit: Payer: Self-pay

## 2020-01-31 ENCOUNTER — Encounter (HOSPITAL_COMMUNITY)
Admission: RE | Admit: 2020-01-31 | Discharge: 2020-01-31 | Disposition: A | Discharge status: Home / Self Care | Payer: Medicare PPO | Source: Ambulatory Visit | Attending: Nephrology | Admitting: Nephrology

## 2020-01-31 ENCOUNTER — Ambulatory Visit (HOSPITAL_COMMUNITY): Payer: Medicare PPO

## 2020-01-31 ENCOUNTER — Other Ambulatory Visit (HOSPITAL_COMMUNITY): Payer: Medicare PPO

## 2020-01-31 ENCOUNTER — Encounter (HOSPITAL_COMMUNITY): Payer: Self-pay

## 2020-01-31 DIAGNOSIS — N184 Chronic kidney disease, stage 4 (severe): Secondary | ICD-10-CM | POA: Diagnosis not present

## 2020-01-31 LAB — COMPREHENSIVE METABOLIC PANEL
ALT: 15 U/L (ref 0–44)
AST: 15 U/L (ref 15–41)
Albumin: 4.3 g/dL (ref 3.5–5.0)
Alkaline Phosphatase: 54 U/L (ref 38–126)
Anion gap: 13 (ref 5–15)
BUN: 51 mg/dL — ABNORMAL HIGH (ref 8–23)
CO2: 21 mmol/L — ABNORMAL LOW (ref 22–32)
Calcium: 9.7 mg/dL (ref 8.9–10.3)
Chloride: 107 mmol/L (ref 98–111)
Creatinine, Ser: 2.27 mg/dL — ABNORMAL HIGH (ref 0.44–1.00)
GFR calc Af Amer: 22 mL/min — ABNORMAL LOW (ref >60)
GFR calc non Af Amer: 19 mL/min — ABNORMAL LOW (ref >60)
Glucose, Bld: 117 mg/dL — ABNORMAL HIGH (ref 70–99)
Potassium: 4.4 mmol/L (ref 3.5–5.1)
Sodium: 141 mmol/L (ref 135–145)
Total Bilirubin: 0.5 mg/dL (ref 0.3–1.2)
Total Protein: 7.3 g/dL (ref 6.5–8.1)

## 2020-01-31 LAB — POCT HEMOGLOBIN-HEMACUE: Hemoglobin: 8.8 g/dL — ABNORMAL LOW (ref 12.0–15.0)

## 2020-01-31 LAB — CBC
HCT: 31.4 % — ABNORMAL LOW (ref 36.0–46.0)
Hemoglobin: 9.4 g/dL — ABNORMAL LOW (ref 12.0–15.0)
MCH: 30.5 pg (ref 26.0–34.0)
MCHC: 29.9 g/dL — ABNORMAL LOW (ref 30.0–36.0)
MCV: 101.9 fL — ABNORMAL HIGH (ref 80.0–100.0)
Platelets: 201 10*3/uL (ref 150–400)
RBC: 3.08 MIL/uL — ABNORMAL LOW (ref 3.87–5.11)
RDW: 14.2 % (ref 11.5–15.5)
WBC: 4.2 10*3/uL (ref 4.0–10.5)
nRBC: 0 % (ref 0.0–0.2)

## 2020-01-31 LAB — PROTEIN / CREATININE RATIO, URINE
Creatinine, Urine: 84.84 mg/dL
Protein Creatinine Ratio: 0.26 mg/mg{Cre} — ABNORMAL HIGH (ref 0.00–0.15)
Total Protein, Urine: 22 mg/dL

## 2020-01-31 LAB — IRON AND TIBC
Iron: 62 ug/dL (ref 28–170)
Saturation Ratios: 22 % (ref 10.4–31.8)
TIBC: 283 ug/dL (ref 250–450)
UIBC: 221 ug/dL

## 2020-01-31 LAB — RENAL FUNCTION PANEL
Albumin: 4.5 g/dL (ref 3.5–5.0)
Anion gap: 12 (ref 5–15)
BUN: 52 mg/dL — ABNORMAL HIGH (ref 8–23)
CO2: 23 mmol/L (ref 22–32)
Calcium: 9.8 mg/dL (ref 8.9–10.3)
Chloride: 108 mmol/L (ref 98–111)
Creatinine, Ser: 2.31 mg/dL — ABNORMAL HIGH (ref 0.44–1.00)
GFR calc Af Amer: 21 mL/min — ABNORMAL LOW (ref >60)
GFR calc non Af Amer: 19 mL/min — ABNORMAL LOW (ref >60)
Glucose, Bld: 118 mg/dL — ABNORMAL HIGH (ref 70–99)
Phosphorus: 3.8 mg/dL (ref 2.5–4.6)
Potassium: 4.5 mmol/L (ref 3.5–5.1)
Sodium: 143 mmol/L (ref 135–145)

## 2020-01-31 LAB — VITAMIN D 25 HYDROXY (VIT D DEFICIENCY, FRACTURES): Vit D, 25-Hydroxy: 63.7 ng/mL (ref 30–100)

## 2020-01-31 LAB — FERRITIN: Ferritin: 657 ng/mL — ABNORMAL HIGH (ref 11–307)

## 2020-01-31 LAB — LIPID PANEL
Cholesterol: 179 mg/dL (ref 0–200)
HDL: 38 mg/dL — ABNORMAL LOW (ref >40)
LDL Cholesterol: 113 mg/dL — ABNORMAL HIGH (ref 0–99)
Total CHOL/HDL Ratio: 4.7 RATIO
Triglycerides: 141 mg/dL (ref <150)
VLDL: 28 mg/dL (ref 0–40)

## 2020-01-31 LAB — HEMOGLOBIN A1C
Hgb A1c MFr Bld: 6.5 % — ABNORMAL HIGH (ref 4.8–5.6)
Mean Plasma Glucose: 139.85 mg/dL

## 2020-01-31 MED ORDER — EPOETIN ALFA 3000 UNIT/ML IJ SOLN
6000.0000 [IU] | Freq: Once | INTRAMUSCULAR | Status: AC
  Administered 2020-01-31: 6000 [IU] via SUBCUTANEOUS
  Filled 2020-01-31: qty 2

## 2020-02-01 DIAGNOSIS — E211 Secondary hyperparathyroidism, not elsewhere classified: Secondary | ICD-10-CM | POA: Diagnosis not present

## 2020-02-01 DIAGNOSIS — E1122 Type 2 diabetes mellitus with diabetic chronic kidney disease: Secondary | ICD-10-CM | POA: Diagnosis not present

## 2020-02-01 DIAGNOSIS — D631 Anemia in chronic kidney disease: Secondary | ICD-10-CM | POA: Diagnosis not present

## 2020-02-01 DIAGNOSIS — E559 Vitamin D deficiency, unspecified: Secondary | ICD-10-CM | POA: Diagnosis not present

## 2020-02-01 DIAGNOSIS — E1129 Type 2 diabetes mellitus with other diabetic kidney complication: Secondary | ICD-10-CM | POA: Diagnosis not present

## 2020-02-01 DIAGNOSIS — R809 Proteinuria, unspecified: Secondary | ICD-10-CM | POA: Diagnosis not present

## 2020-02-01 DIAGNOSIS — N189 Chronic kidney disease, unspecified: Secondary | ICD-10-CM | POA: Diagnosis not present

## 2020-02-01 DIAGNOSIS — I129 Hypertensive chronic kidney disease with stage 1 through stage 4 chronic kidney disease, or unspecified chronic kidney disease: Secondary | ICD-10-CM | POA: Diagnosis not present

## 2020-02-01 LAB — PARATHYROID HORMONE, INTACT (NO CA): PTH: 50 pg/mL (ref 15–65)

## 2020-02-06 DIAGNOSIS — E119 Type 2 diabetes mellitus without complications: Secondary | ICD-10-CM | POA: Diagnosis not present

## 2020-02-14 ENCOUNTER — Encounter (HOSPITAL_COMMUNITY)
Admission: RE | Admit: 2020-02-14 | Discharge: 2020-02-14 | Disposition: A | Discharge status: Home / Self Care | Payer: Medicare PPO | Source: Ambulatory Visit | Attending: Nephrology | Admitting: Nephrology

## 2020-02-14 ENCOUNTER — Encounter (HOSPITAL_COMMUNITY): Payer: Self-pay

## 2020-02-14 ENCOUNTER — Other Ambulatory Visit: Payer: Self-pay

## 2020-02-14 DIAGNOSIS — N184 Chronic kidney disease, stage 4 (severe): Secondary | ICD-10-CM | POA: Insufficient documentation

## 2020-02-14 DIAGNOSIS — D631 Anemia in chronic kidney disease: Secondary | ICD-10-CM | POA: Insufficient documentation

## 2020-02-14 LAB — POCT I-STAT, CHEM 8
BUN: 52 mg/dL — ABNORMAL HIGH (ref 8–23)
Calcium, Ion: 1.3 mmol/L (ref 1.15–1.40)
Chloride: 106 mmol/L (ref 98–111)
Creatinine, Ser: 3 mg/dL — ABNORMAL HIGH (ref 0.44–1.00)
Glucose, Bld: 173 mg/dL — ABNORMAL HIGH (ref 70–99)
HCT: 28 % — ABNORMAL LOW (ref 36.0–46.0)
Hemoglobin: 9.5 g/dL — ABNORMAL LOW (ref 12.0–15.0)
Potassium: 4.9 mmol/L (ref 3.5–5.1)
Sodium: 141 mmol/L (ref 135–145)
TCO2: 23 mmol/L (ref 22–32)

## 2020-02-14 MED ORDER — EPOETIN ALFA 3000 UNIT/ML IJ SOLN
INTRAMUSCULAR | Status: AC
  Filled 2020-02-14: qty 2

## 2020-02-14 MED ORDER — EPOETIN ALFA 3000 UNIT/ML IJ SOLN
6000.0000 [IU] | Freq: Once | INTRAMUSCULAR | Status: AC
  Administered 2020-02-14: 6000 [IU] via SUBCUTANEOUS

## 2020-02-28 ENCOUNTER — Other Ambulatory Visit: Payer: Self-pay

## 2020-02-28 ENCOUNTER — Encounter (HOSPITAL_COMMUNITY)
Admission: RE | Admit: 2020-02-28 | Discharge: 2020-02-28 | Disposition: A | Discharge status: Home / Self Care | Payer: Medicare PPO | Source: Ambulatory Visit | Attending: Nephrology | Admitting: Nephrology

## 2020-02-28 ENCOUNTER — Encounter (HOSPITAL_COMMUNITY): Payer: Self-pay

## 2020-02-28 DIAGNOSIS — N184 Chronic kidney disease, stage 4 (severe): Secondary | ICD-10-CM | POA: Diagnosis not present

## 2020-02-28 LAB — POCT HEMOGLOBIN-HEMACUE: Hemoglobin: 8.9 g/dL — ABNORMAL LOW (ref 12.0–15.0)

## 2020-02-28 MED ORDER — EPOETIN ALFA 3000 UNIT/ML IJ SOLN
INTRAMUSCULAR | Status: AC
  Filled 2020-02-28: qty 2

## 2020-02-28 MED ORDER — EPOETIN ALFA 3000 UNIT/ML IJ SOLN
6000.0000 [IU] | Freq: Once | INTRAMUSCULAR | Status: AC
  Administered 2020-02-28: 6000 [IU] via SUBCUTANEOUS

## 2020-03-02 DIAGNOSIS — E1122 Type 2 diabetes mellitus with diabetic chronic kidney disease: Secondary | ICD-10-CM | POA: Diagnosis not present

## 2020-03-07 ENCOUNTER — Ambulatory Visit (INDEPENDENT_AMBULATORY_CARE_PROVIDER_SITE_OTHER): Payer: Medicare Other | Admitting: Podiatry

## 2020-03-07 ENCOUNTER — Other Ambulatory Visit: Payer: Self-pay

## 2020-03-07 VITALS — Temp 97.2°F

## 2020-03-07 DIAGNOSIS — E1151 Type 2 diabetes mellitus with diabetic peripheral angiopathy without gangrene: Secondary | ICD-10-CM | POA: Diagnosis not present

## 2020-03-07 DIAGNOSIS — E119 Type 2 diabetes mellitus without complications: Secondary | ICD-10-CM | POA: Diagnosis not present

## 2020-03-07 DIAGNOSIS — E1169 Type 2 diabetes mellitus with other specified complication: Secondary | ICD-10-CM

## 2020-03-07 DIAGNOSIS — M2012 Hallux valgus (acquired), left foot: Secondary | ICD-10-CM | POA: Diagnosis not present

## 2020-03-07 DIAGNOSIS — M2011 Hallux valgus (acquired), right foot: Secondary | ICD-10-CM | POA: Diagnosis not present

## 2020-03-07 DIAGNOSIS — M2042 Other hammer toe(s) (acquired), left foot: Secondary | ICD-10-CM

## 2020-03-07 DIAGNOSIS — M2041 Other hammer toe(s) (acquired), right foot: Secondary | ICD-10-CM

## 2020-03-07 DIAGNOSIS — B351 Tinea unguium: Secondary | ICD-10-CM | POA: Diagnosis not present

## 2020-03-07 NOTE — Progress Notes (Signed)
  Subjective:  Patient ID: Stephanie Middleton, female    DOB: [birth year],  MRN: [id number]  Chief Complaint  Patient presents with  . Nail Problem    Thick, long toenails  . Diabetes    Most recent A1c = 6.5 in April 2021. Pt stated, "My [glucose] was 100-something this morning". No history of neuropathy or ulcers per pt.    86 y.o. female presents with the above complaint. History confirmed with patient.   Objective:  Physical Exam: warm, good capillary refill, nail exam onychomycosis of the toenails, no trophic changes or ulcerative lesions. DP pulses palpable, PT pulses palpable and protective sensation intact Left Foot: hammertoes, HAV Right Foot: hammertoes, HAV  No images are attached to the encounter.  Assessment:   1. Onychomycosis of multiple toenails with type 2 diabetes mellitus and peripheral angiopathy ([place])   2. Encounter for diabetic foot exam ([place])   3. Hammertoes of both feet   4. Acquired hallux valgus of both feet      Plan:  Patient was evaluated and treated and all questions answered.  Onychomycosis, Diabetes and PAD -Patient is diabetic with a qualifying condition for at risk foot care. -Educated on DM Footcare.  Procedure: Nail Debridement Rationale: Patient meets criteria for routine foot care due to PAD Type of Debridement: manual, sharp debridement. Instrumentation: Nail nipper, rotary burr. Number of Nails: 10    Return in about 3 months (around 06/07/2020) for Diabetic Foot Care.

## 2020-03-13 ENCOUNTER — Encounter (HOSPITAL_COMMUNITY): Payer: Self-pay

## 2020-03-13 ENCOUNTER — Encounter (HOSPITAL_COMMUNITY)
Admission: RE | Admit: 2020-03-13 | Discharge: 2020-03-13 | Disposition: A | Discharge status: Home / Self Care | Payer: Medicare PPO | Source: Ambulatory Visit | Attending: Nephrology | Admitting: Nephrology

## 2020-03-13 ENCOUNTER — Other Ambulatory Visit: Payer: Self-pay

## 2020-03-13 DIAGNOSIS — N184 Chronic kidney disease, stage 4 (severe): Secondary | ICD-10-CM | POA: Insufficient documentation

## 2020-03-13 DIAGNOSIS — D631 Anemia in chronic kidney disease: Secondary | ICD-10-CM | POA: Diagnosis not present

## 2020-03-13 LAB — POCT HEMOGLOBIN-HEMACUE: Hemoglobin: 8.6 g/dL — ABNORMAL LOW (ref 12.0–15.0)

## 2020-03-13 MED ORDER — EPOETIN ALFA 3000 UNIT/ML IJ SOLN
6000.0000 [IU] | Freq: Once | INTRAMUSCULAR | Status: AC
  Administered 2020-03-13: 6000 [IU] via SUBCUTANEOUS

## 2020-03-13 MED ORDER — EPOETIN ALFA 3000 UNIT/ML IJ SOLN
INTRAMUSCULAR | Status: AC
  Filled 2020-03-13: qty 2

## 2020-03-27 ENCOUNTER — Encounter (HOSPITAL_COMMUNITY)
Admission: RE | Admit: 2020-03-27 | Discharge: 2020-03-27 | Disposition: A | Discharge status: Home / Self Care | Payer: Medicare PPO | Source: Ambulatory Visit | Attending: Nephrology | Admitting: Nephrology

## 2020-03-27 ENCOUNTER — Other Ambulatory Visit: Payer: Self-pay

## 2020-03-27 DIAGNOSIS — N184 Chronic kidney disease, stage 4 (severe): Secondary | ICD-10-CM | POA: Diagnosis not present

## 2020-03-27 LAB — RENAL FUNCTION PANEL
Albumin: 4.4 g/dL (ref 3.5–5.0)
Anion gap: 12 (ref 5–15)
BUN: 62 mg/dL — ABNORMAL HIGH (ref 8–23)
CO2: 22 mmol/L (ref 22–32)
Calcium: 9.5 mg/dL (ref 8.9–10.3)
Chloride: 104 mmol/L (ref 98–111)
Creatinine, Ser: 2.42 mg/dL — ABNORMAL HIGH (ref 0.44–1.00)
GFR calc Af Amer: 20 mL/min — ABNORMAL LOW (ref >60)
GFR calc non Af Amer: 18 mL/min — ABNORMAL LOW (ref >60)
Glucose, Bld: 151 mg/dL — ABNORMAL HIGH (ref 70–99)
Phosphorus: 4 mg/dL (ref 2.5–4.6)
Potassium: 4.4 mmol/L (ref 3.5–5.1)
Sodium: 138 mmol/L (ref 135–145)

## 2020-03-27 LAB — CBC
HCT: 29.9 % — ABNORMAL LOW (ref 36.0–46.0)
Hemoglobin: 9.2 g/dL — ABNORMAL LOW (ref 12.0–15.0)
MCH: 31.1 pg (ref 26.0–34.0)
MCHC: 30.8 g/dL (ref 30.0–36.0)
MCV: 101 fL — ABNORMAL HIGH (ref 80.0–100.0)
Platelets: 219 10*3/uL (ref 150–400)
RBC: 2.96 MIL/uL — ABNORMAL LOW (ref 3.87–5.11)
RDW: 14.3 % (ref 11.5–15.5)
WBC: 4.4 10*3/uL (ref 4.0–10.5)
nRBC: 0 % (ref 0.0–0.2)

## 2020-03-27 LAB — PROTEIN / CREATININE RATIO, URINE
Creatinine, Urine: 68.76 mg/dL
Protein Creatinine Ratio: 0.38 mg/mg{Cre} — ABNORMAL HIGH (ref 0.00–0.15)
Total Protein, Urine: 26 mg/dL

## 2020-03-27 LAB — POCT HEMOGLOBIN-HEMACUE: Hemoglobin: 9.4 g/dL — ABNORMAL LOW (ref 12.0–15.0)

## 2020-03-27 MED ORDER — EPOETIN ALFA 3000 UNIT/ML IJ SOLN
6000.0000 [IU] | Freq: Once | INTRAMUSCULAR | Status: AC
  Administered 2020-03-27: 6000 [IU] via SUBCUTANEOUS

## 2020-03-27 MED ORDER — EPOETIN ALFA 3000 UNIT/ML IJ SOLN
INTRAMUSCULAR | Status: AC
  Filled 2020-03-27: qty 2

## 2020-03-29 DIAGNOSIS — E1129 Type 2 diabetes mellitus with other diabetic kidney complication: Secondary | ICD-10-CM | POA: Diagnosis not present

## 2020-03-29 DIAGNOSIS — E1122 Type 2 diabetes mellitus with diabetic chronic kidney disease: Secondary | ICD-10-CM | POA: Diagnosis not present

## 2020-03-29 DIAGNOSIS — R600 Localized edema: Secondary | ICD-10-CM | POA: Diagnosis not present

## 2020-03-29 DIAGNOSIS — N170 Acute kidney failure with tubular necrosis: Secondary | ICD-10-CM | POA: Diagnosis not present

## 2020-03-29 DIAGNOSIS — R809 Proteinuria, unspecified: Secondary | ICD-10-CM | POA: Diagnosis not present

## 2020-03-29 DIAGNOSIS — N189 Chronic kidney disease, unspecified: Secondary | ICD-10-CM | POA: Diagnosis not present

## 2020-03-29 DIAGNOSIS — E211 Secondary hyperparathyroidism, not elsewhere classified: Secondary | ICD-10-CM | POA: Diagnosis not present

## 2020-03-29 DIAGNOSIS — D631 Anemia in chronic kidney disease: Secondary | ICD-10-CM | POA: Diagnosis not present

## 2020-04-09 DIAGNOSIS — I10 Essential (primary) hypertension: Secondary | ICD-10-CM | POA: Diagnosis not present

## 2020-04-09 DIAGNOSIS — N184 Chronic kidney disease, stage 4 (severe): Secondary | ICD-10-CM | POA: Diagnosis not present

## 2020-04-09 DIAGNOSIS — E1122 Type 2 diabetes mellitus with diabetic chronic kidney disease: Secondary | ICD-10-CM | POA: Diagnosis not present

## 2020-04-10 ENCOUNTER — Encounter (HOSPITAL_COMMUNITY)
Admission: RE | Admit: 2020-04-10 | Discharge: 2020-04-10 | Disposition: A | Discharge status: Home / Self Care | Payer: Medicare PPO | Source: Ambulatory Visit | Attending: Nephrology | Admitting: Nephrology

## 2020-04-10 ENCOUNTER — Other Ambulatory Visit: Payer: Self-pay

## 2020-04-10 ENCOUNTER — Encounter (HOSPITAL_COMMUNITY): Payer: Self-pay

## 2020-04-10 DIAGNOSIS — D631 Anemia in chronic kidney disease: Secondary | ICD-10-CM | POA: Insufficient documentation

## 2020-04-10 DIAGNOSIS — N184 Chronic kidney disease, stage 4 (severe): Secondary | ICD-10-CM | POA: Insufficient documentation

## 2020-04-10 LAB — POCT HEMOGLOBIN-HEMACUE: Hemoglobin: 9 g/dL — ABNORMAL LOW (ref 12.0–15.0)

## 2020-04-10 MED ORDER — EPOETIN ALFA 3000 UNIT/ML IJ SOLN
INTRAMUSCULAR | Status: AC
  Filled 2020-04-10: qty 2

## 2020-04-10 MED ORDER — EPOETIN ALFA 3000 UNIT/ML IJ SOLN
6000.0000 [IU] | Freq: Once | INTRAMUSCULAR | Status: AC
  Administered 2020-04-10: 6000 [IU] via SUBCUTANEOUS

## 2020-04-22 DIAGNOSIS — I10 Essential (primary) hypertension: Secondary | ICD-10-CM | POA: Diagnosis not present

## 2020-04-22 DIAGNOSIS — R609 Edema, unspecified: Secondary | ICD-10-CM | POA: Diagnosis not present

## 2020-04-24 ENCOUNTER — Other Ambulatory Visit: Payer: Self-pay

## 2020-04-24 ENCOUNTER — Encounter (HOSPITAL_COMMUNITY)
Admission: RE | Admit: 2020-04-24 | Discharge: 2020-04-24 | Disposition: A | Discharge status: Home / Self Care | Payer: Medicare PPO | Source: Ambulatory Visit | Attending: Nephrology | Admitting: Nephrology

## 2020-04-24 DIAGNOSIS — N184 Chronic kidney disease, stage 4 (severe): Secondary | ICD-10-CM | POA: Diagnosis not present

## 2020-04-24 LAB — POCT HEMOGLOBIN-HEMACUE: Hemoglobin: 8.8 g/dL — ABNORMAL LOW (ref 12.0–15.0)

## 2020-04-24 MED ORDER — EPOETIN ALFA 3000 UNIT/ML IJ SOLN
6000.0000 [IU] | Freq: Once | INTRAMUSCULAR | Status: AC
  Administered 2020-04-24: 6000 [IU] via SUBCUTANEOUS

## 2020-04-24 MED ORDER — EPOETIN ALFA 3000 UNIT/ML IJ SOLN
INTRAMUSCULAR | Status: AC
  Filled 2020-04-24: qty 2

## 2020-05-08 ENCOUNTER — Encounter (HOSPITAL_COMMUNITY)
Admission: RE | Admit: 2020-05-08 | Discharge: 2020-05-08 | Disposition: A | Discharge status: Home / Self Care | Payer: Medicare PPO | Source: Ambulatory Visit | Attending: Nephrology | Admitting: Nephrology

## 2020-05-08 ENCOUNTER — Encounter (HOSPITAL_COMMUNITY): Payer: Self-pay

## 2020-05-08 ENCOUNTER — Other Ambulatory Visit: Payer: Self-pay

## 2020-05-08 DIAGNOSIS — D631 Anemia in chronic kidney disease: Secondary | ICD-10-CM | POA: Diagnosis not present

## 2020-05-08 DIAGNOSIS — N184 Chronic kidney disease, stage 4 (severe): Secondary | ICD-10-CM | POA: Insufficient documentation

## 2020-05-08 LAB — POCT HEMOGLOBIN-HEMACUE: Hemoglobin: 8.9 g/dL — ABNORMAL LOW (ref 12.0–15.0)

## 2020-05-08 MED ORDER — EPOETIN ALFA 3000 UNIT/ML IJ SOLN
6000.0000 [IU] | Freq: Once | INTRAMUSCULAR | Status: AC
  Administered 2020-05-08: 6000 [IU] via SUBCUTANEOUS
  Filled 2020-05-08: qty 2

## 2020-05-22 ENCOUNTER — Encounter (HOSPITAL_COMMUNITY)
Admission: RE | Admit: 2020-05-22 | Discharge: 2020-05-22 | Disposition: A | Discharge status: Home / Self Care | Payer: Medicare PPO | Source: Ambulatory Visit | Attending: Nephrology | Admitting: Nephrology

## 2020-05-22 ENCOUNTER — Encounter (HOSPITAL_COMMUNITY): Payer: Self-pay

## 2020-05-22 ENCOUNTER — Other Ambulatory Visit: Payer: Self-pay

## 2020-05-22 DIAGNOSIS — N184 Chronic kidney disease, stage 4 (severe): Secondary | ICD-10-CM | POA: Diagnosis not present

## 2020-05-22 LAB — CBC
HCT: 28.1 % — ABNORMAL LOW (ref 36.0–46.0)
Hemoglobin: 8.7 g/dL — ABNORMAL LOW (ref 12.0–15.0)
MCH: 31.2 pg (ref 26.0–34.0)
MCHC: 31 g/dL (ref 30.0–36.0)
MCV: 100.7 fL — ABNORMAL HIGH (ref 80.0–100.0)
Platelets: 192 10*3/uL (ref 150–400)
RBC: 2.79 MIL/uL — ABNORMAL LOW (ref 3.87–5.11)
RDW: 14.2 % (ref 11.5–15.5)
WBC: 4.1 10*3/uL (ref 4.0–10.5)
nRBC: 0 % (ref 0.0–0.2)

## 2020-05-22 LAB — POCT HEMOGLOBIN-HEMACUE: Hemoglobin: 8.8 g/dL — ABNORMAL LOW (ref 12.0–15.0)

## 2020-05-22 LAB — RENAL FUNCTION PANEL
Albumin: 4.1 g/dL (ref 3.5–5.0)
Anion gap: 10 (ref 5–15)
BUN: 62 mg/dL — ABNORMAL HIGH (ref 8–23)
CO2: 21 mmol/L — ABNORMAL LOW (ref 22–32)
Calcium: 9.2 mg/dL (ref 8.9–10.3)
Chloride: 106 mmol/L (ref 98–111)
Creatinine, Ser: 2.84 mg/dL — ABNORMAL HIGH (ref 0.44–1.00)
GFR calc Af Amer: 17 mL/min — ABNORMAL LOW (ref >60)
GFR calc non Af Amer: 14 mL/min — ABNORMAL LOW (ref >60)
Glucose, Bld: 222 mg/dL — ABNORMAL HIGH (ref 70–99)
Phosphorus: 4.1 mg/dL (ref 2.5–4.6)
Potassium: 4.6 mmol/L (ref 3.5–5.1)
Sodium: 137 mmol/L (ref 135–145)

## 2020-05-22 LAB — PROTEIN / CREATININE RATIO, URINE
Creatinine, Urine: 72.14 mg/dL
Protein Creatinine Ratio: 0.26 mg/mg{Cre} — ABNORMAL HIGH (ref 0.00–0.15)
Total Protein, Urine: 19 mg/dL

## 2020-05-22 MED ORDER — EPOETIN ALFA 3000 UNIT/ML IJ SOLN
6000.0000 [IU] | Freq: Once | INTRAMUSCULAR | Status: AC
  Administered 2020-05-22: 6000 [IU] via SUBCUTANEOUS
  Filled 2020-05-22: qty 2

## 2020-05-28 ENCOUNTER — Other Ambulatory Visit: Payer: Self-pay

## 2020-05-28 ENCOUNTER — Ambulatory Visit: Payer: Medicare PPO | Admitting: Podiatry

## 2020-05-28 ENCOUNTER — Encounter: Payer: Self-pay | Admitting: Podiatry

## 2020-05-28 DIAGNOSIS — M79674 Pain in right toe(s): Secondary | ICD-10-CM | POA: Diagnosis not present

## 2020-05-28 DIAGNOSIS — M79675 Pain in left toe(s): Secondary | ICD-10-CM

## 2020-05-28 DIAGNOSIS — B351 Tinea unguium: Secondary | ICD-10-CM | POA: Diagnosis not present

## 2020-05-28 NOTE — Progress Notes (Addendum)
Subjective:  Patient ID: Stephanie Middleton, female    DOB: [birth year],  MRN: [id number]  86 y.o. female presents with at risk foot care. Pt has h/o NIDDM with chronic kidney disease and painful thick toenails that are difficult to trim. Pain interferes with ambulation. Aggravating factors include wearing enclosed shoe gear. Pain is relieved with periodic professional debridement.   Blood sugar was 120 mg/dl this morning.  Daughter is present during today's visit. They note no new pedal concerns on today's visit.   Stephanie Middleton states she is having right knee pain and needs to see someone for it.    Review of Systems: Negative except as noted in the HPI.  Past Medical History:  Diagnosis Date  . Anemia   . Chronic kidney disease   . Chronic pain    Back, right hip and right knee  . Diabetes mellitus without complication ([place])   . Hypercholesteremia   . Hypertension    Past Surgical History:  Procedure Laterality Date  . CHOLECYSTECTOMY    . CYST EXCISION     Patient Active Problem List   Diagnosis Date Noted  . Anemia in chronic kidney disease 07/25/2010  . Chronic kidney disease 07/25/2010  . Essential (primary) hypertension 07/25/2010  . Low back pain 07/25/2010  . Pure hypercholesterolemia 07/25/2010  . Type 2 diabetes mellitus with diabetic nephropathy ([place]) 07/25/2010  . Type 2 diabetes mellitus with hyperglycemia ([place]) 07/25/2010    Current Outpatient Medications:  .  acetaminophen (TYLENOL) 500 MG tablet, Take 500 mg by mouth 2 (two) times a day., Disp: , Rfl:  .  allopurinol (ZYLOPRIM) 100 MG tablet, Take 100 mg by mouth daily., Disp: , Rfl:  .  amLODipine (NORVASC) 5 MG tablet, Take 5 mg by mouth daily., Disp: , Rfl:  .  amLODipine-valsartan (EXFORGE) 10-160 MG tablet, Take 1 tablet by mouth daily. exforge HCT 5-160 mg/12.5 mg, Disp: , Rfl:  .  aspirin EC 81 MG tablet, Take 81 mg by mouth daily., Disp: , Rfl:  .  calcitRIOL (ROCALTROL) 0.25 MCG capsule, Take 0.25 mcg  by mouth daily., Disp: , Rfl:  .  Cholecalciferol (D-3-5) 5000 units capsule, Take 5,000 Units by mouth daily., Disp: , Rfl:  .  cholestyramine light (PREVALITE) 4 g packet, Take 4 g by mouth 2 (two) times daily., Disp: , Rfl:  .  colchicine 0.6 MG tablet, Take 0.6 mg by mouth daily as needed., Disp: , Rfl:  .  epoetin alfa (EPOGEN) 3000 UNIT/ML injection, Inject 6,000 Units into the vein every 14 (fourteen) days. , Disp: , Rfl:  .  furosemide (LASIX) 20 MG tablet, Take 20 mg by mouth., Disp: , Rfl:  .  glipiZIDE (GLUCOTROL) 5 MG tablet, Take by mouth 2 (two) times daily before a meal. Take 2 tablets twice daily, Disp: , Rfl:  .  guanFACINE (TENEX) 2 MG tablet, Take 2 mg by mouth at bedtime., Disp: , Rfl:  .  hydrochlorothiazide (HYDRODIURIL) 12.5 MG tablet, , Disp: , Rfl:  .  HYDROcodone-acetaminophen (NORCO/VICODIN) 5-325 MG tablet, Take 1 tablet by mouth every 12 (twelve) hours as needed for moderate pain (take 1/2 tablet every 12 hours as needed)., Disp: , Rfl:  .  insulin detemir (LEVEMIR) 100 UNIT/ML injection, Inject 24 Units into the skin daily., Disp: , Rfl:  .  iron polysaccharides (NIFEREX) 150 MG capsule, Take 150 mg by mouth 2 (two) times daily., Disp: , Rfl:  .  labetalol (NORMODYNE) 200 MG tablet, Take 200 mg by  mouth 2 (two) times daily. Take 2 tablets twice daily, Disp: , Rfl:  .  linagliptin (TRADJENTA) 5 MG TABS tablet, Take 5 mg by mouth daily., Disp: , Rfl:  .  pravastatin (PRAVACHOL) 40 MG tablet, Take 40 mg by mouth at bedtime., Disp: , Rfl:  .  predniSONE (STERAPRED UNI-PAK 21 TAB) 10 MG (21) TBPK tablet, Take 10 mg by mouth daily. Finished dose pack on Monday 05/15/2019., Disp: , Rfl:  .  RABEprazole (ACIPHEX) 20 MG tablet, Take 20 mg by mouth daily as needed. 1 tablet by mouth everyday as needed for indigestion, Disp: , Rfl:  .  valsartan (DIOVAN) 160 MG tablet, Take 160 mg by mouth daily., Disp: , Rfl:  No Known Allergies Social History   Tobacco Use  Smoking Status  Never Smoker  Smokeless Tobacco Never Used   Objective:  There were no vitals filed for this visit. Constitutional Patient is a pleasant 86 y.o. African American female WD, WN in NAD.Marland Kitchen AAO x 3.  Vascular Capillary fill time to digits <3 seconds b/l lower extremities. Palpable pedal pulses b/l LE. Pedal hair sparse. Lower extremity skin temperature gradient within normal limits. No edema noted b/l lower extremities. No cyanosis or clubbing noted.  Neurologic Normal speech. Oriented to person, place, and time. Protective sensation intact 5/5 intact bilaterally with 10g monofilament b/l.  Dermatologic Pedal skin with normal turgor, texture and tone bilaterally. No open wounds bilaterally. No interdigital macerations bilaterally. Toenails 1-5 b/l elongated, discolored, dystrophic, thickened, crumbly with subungual debris and tenderness to dorsal palpation. Incurvated nailplate medial and lateral border(s) L hallux and R hallux.  Nail border hypertrophy not present. There is tenderness to palpation. Sign(s) of infection: no clinical signs of infection noted on examination today..  Orthopedic: Normal muscle strength 5/5 to all lower extremity muscle groups bilaterally. No pain crepitus or joint limitation noted with ROM b/l. Hallux valgus with bunion deformity noted b/l lower extremities. Hammertoes noted to the b/l lower extremities.   Hemoglobin A1C Latest Ref Rng & Units 01/31/2020  HGBA1C 4.8 - 5.6 % 6.5(H)  Some recent data might be hidden   Assessment:  No diagnosis found. Plan:  Patient was evaluated and treated and all questions answered.  Onychomycosis with pain -Nails palliatively debridement as below. -Educated on self-care  Procedure: Nail Debridement Rationale: Pain Type of Debridement: manual, sharp debridement. Instrumentation: Nail nipper, rotary burr. Number of Nails: 10  -Examined patient. -Continue diabetic foot care principles. -Toenails 1-5 b/l were debrided in length  and girth with sterile nail nippers and dremel without iatrogenic bleeding.  -Offending nail border debrided and curretaged L hallux and R hallux utilizing sterile nail nipper and currette. Border cleansed with alcohol and triple antibiotic applied. No further treatment required by patient/caregiver. -Patient to report any pedal injuries to medical professional immediately. -Patient to continue soft, supportive shoe gear daily. -Patient/POA to call should there be question/concern in the interim.  Return in about 3 months (around 08/28/2020) for diabetic nail trim.  Marzetta Board, DPM

## 2020-05-29 DIAGNOSIS — N189 Chronic kidney disease, unspecified: Secondary | ICD-10-CM | POA: Diagnosis not present

## 2020-05-29 DIAGNOSIS — R809 Proteinuria, unspecified: Secondary | ICD-10-CM | POA: Diagnosis not present

## 2020-05-29 DIAGNOSIS — R600 Localized edema: Secondary | ICD-10-CM | POA: Diagnosis not present

## 2020-05-29 DIAGNOSIS — E1129 Type 2 diabetes mellitus with other diabetic kidney complication: Secondary | ICD-10-CM | POA: Diagnosis not present

## 2020-05-29 DIAGNOSIS — E1122 Type 2 diabetes mellitus with diabetic chronic kidney disease: Secondary | ICD-10-CM | POA: Diagnosis not present

## 2020-05-29 DIAGNOSIS — D631 Anemia in chronic kidney disease: Secondary | ICD-10-CM | POA: Diagnosis not present

## 2020-05-29 DIAGNOSIS — N170 Acute kidney failure with tubular necrosis: Secondary | ICD-10-CM | POA: Diagnosis not present

## 2020-06-03 DIAGNOSIS — E1122 Type 2 diabetes mellitus with diabetic chronic kidney disease: Secondary | ICD-10-CM | POA: Diagnosis not present

## 2020-06-05 ENCOUNTER — Other Ambulatory Visit: Payer: Self-pay

## 2020-06-05 ENCOUNTER — Encounter (HOSPITAL_COMMUNITY)
Admission: RE | Admit: 2020-06-05 | Discharge: 2020-06-05 | Disposition: A | Discharge status: Home / Self Care | Payer: Medicare PPO | Attending: Nephrology | Admitting: Nephrology

## 2020-06-05 ENCOUNTER — Encounter (HOSPITAL_COMMUNITY)
Admission: RE | Admit: 2020-06-05 | Discharge: 2020-06-05 | Disposition: A | Discharge status: Home / Self Care | Payer: Medicare PPO | Source: Ambulatory Visit | Attending: Nephrology | Admitting: Nephrology

## 2020-06-05 DIAGNOSIS — N184 Chronic kidney disease, stage 4 (severe): Secondary | ICD-10-CM | POA: Diagnosis not present

## 2020-06-05 DIAGNOSIS — D631 Anemia in chronic kidney disease: Secondary | ICD-10-CM | POA: Diagnosis not present

## 2020-06-05 LAB — POCT HEMOGLOBIN-HEMACUE: Hemoglobin: 8.7 g/dL — ABNORMAL LOW (ref 12.0–15.0)

## 2020-06-05 MED ORDER — EPOETIN ALFA 4000 UNIT/ML IJ SOLN
8000.0000 [IU] | Freq: Once | INTRAMUSCULAR | Status: AC
  Administered 2020-06-05: 8000 [IU] via SUBCUTANEOUS

## 2020-06-05 MED ORDER — EPOETIN ALFA 4000 UNIT/ML IJ SOLN
INTRAMUSCULAR | Status: AC
  Filled 2020-06-05: qty 2

## 2020-06-14 DIAGNOSIS — I10 Essential (primary) hypertension: Secondary | ICD-10-CM | POA: Diagnosis not present

## 2020-06-14 DIAGNOSIS — E669 Obesity, unspecified: Secondary | ICD-10-CM | POA: Diagnosis not present

## 2020-06-14 DIAGNOSIS — E1136 Type 2 diabetes mellitus with diabetic cataract: Secondary | ICD-10-CM | POA: Diagnosis not present

## 2020-06-14 DIAGNOSIS — Z833 Family history of diabetes mellitus: Secondary | ICD-10-CM | POA: Diagnosis not present

## 2020-06-14 DIAGNOSIS — M109 Gout, unspecified: Secondary | ICD-10-CM | POA: Diagnosis not present

## 2020-06-14 DIAGNOSIS — Z6832 Body mass index (BMI) 32.0-32.9, adult: Secondary | ICD-10-CM | POA: Diagnosis not present

## 2020-06-14 DIAGNOSIS — Z7982 Long term (current) use of aspirin: Secondary | ICD-10-CM | POA: Diagnosis not present

## 2020-06-14 DIAGNOSIS — K08109 Complete loss of teeth, unspecified cause, unspecified class: Secondary | ICD-10-CM | POA: Diagnosis not present

## 2020-06-14 DIAGNOSIS — Z823 Family history of stroke: Secondary | ICD-10-CM | POA: Diagnosis not present

## 2020-06-14 DIAGNOSIS — Z8249 Family history of ischemic heart disease and other diseases of the circulatory system: Secondary | ICD-10-CM | POA: Diagnosis not present

## 2020-06-14 DIAGNOSIS — G8929 Other chronic pain: Secondary | ICD-10-CM | POA: Diagnosis not present

## 2020-06-14 DIAGNOSIS — M1990 Unspecified osteoarthritis, unspecified site: Secondary | ICD-10-CM | POA: Diagnosis not present

## 2020-06-14 DIAGNOSIS — E785 Hyperlipidemia, unspecified: Secondary | ICD-10-CM | POA: Diagnosis not present

## 2020-06-14 DIAGNOSIS — Z809 Family history of malignant neoplasm, unspecified: Secondary | ICD-10-CM | POA: Diagnosis not present

## 2020-06-14 DIAGNOSIS — Z794 Long term (current) use of insulin: Secondary | ICD-10-CM | POA: Diagnosis not present

## 2020-06-19 ENCOUNTER — Encounter (HOSPITAL_COMMUNITY): Payer: Self-pay

## 2020-06-19 ENCOUNTER — Other Ambulatory Visit: Payer: Self-pay

## 2020-06-19 ENCOUNTER — Encounter (HOSPITAL_COMMUNITY)
Admission: RE | Admit: 2020-06-19 | Discharge: 2020-06-19 | Disposition: A | Discharge status: Home / Self Care | Payer: Medicare PPO | Source: Ambulatory Visit | Attending: Nephrology | Admitting: Nephrology

## 2020-06-19 DIAGNOSIS — N184 Chronic kidney disease, stage 4 (severe): Secondary | ICD-10-CM | POA: Diagnosis not present

## 2020-06-19 LAB — CBC
HCT: 29.6 % — ABNORMAL LOW (ref 36.0–46.0)
Hemoglobin: 9 g/dL — ABNORMAL LOW (ref 12.0–15.0)
MCH: 30.8 pg (ref 26.0–34.0)
MCHC: 30.4 g/dL (ref 30.0–36.0)
MCV: 101.4 fL — ABNORMAL HIGH (ref 80.0–100.0)
Platelets: 205 10*3/uL (ref 150–400)
RBC: 2.92 MIL/uL — ABNORMAL LOW (ref 3.87–5.11)
RDW: 14 % (ref 11.5–15.5)
WBC: 4.3 10*3/uL (ref 4.0–10.5)
nRBC: 0 % (ref 0.0–0.2)

## 2020-06-19 LAB — RENAL FUNCTION PANEL
Albumin: 4.3 g/dL (ref 3.5–5.0)
Anion gap: 12 (ref 5–15)
BUN: 61 mg/dL — ABNORMAL HIGH (ref 8–23)
CO2: 23 mmol/L (ref 22–32)
Calcium: 9.5 mg/dL (ref 8.9–10.3)
Chloride: 102 mmol/L (ref 98–111)
Creatinine, Ser: 2.83 mg/dL — ABNORMAL HIGH (ref 0.44–1.00)
GFR calc Af Amer: 17 mL/min — ABNORMAL LOW (ref >60)
GFR calc non Af Amer: 14 mL/min — ABNORMAL LOW (ref >60)
Glucose, Bld: 236 mg/dL — ABNORMAL HIGH (ref 70–99)
Phosphorus: 3.6 mg/dL (ref 2.5–4.6)
Potassium: 4.2 mmol/L (ref 3.5–5.1)
Sodium: 137 mmol/L (ref 135–145)

## 2020-06-19 LAB — POCT HEMOGLOBIN-HEMACUE: Hemoglobin: 9 g/dL — ABNORMAL LOW (ref 12.0–15.0)

## 2020-06-19 MED ORDER — EPOETIN ALFA 4000 UNIT/ML IJ SOLN
8000.0000 [IU] | Freq: Once | INTRAMUSCULAR | Status: AC
  Administered 2020-06-19: 8000 [IU] via SUBCUTANEOUS
  Filled 2020-06-19: qty 2

## 2020-07-03 ENCOUNTER — Encounter (HOSPITAL_COMMUNITY)
Admission: RE | Admit: 2020-07-03 | Discharge: 2020-07-03 | Disposition: A | Discharge status: Home / Self Care | Payer: Medicare PPO | Source: Ambulatory Visit | Attending: Nephrology | Admitting: Nephrology

## 2020-07-03 ENCOUNTER — Encounter (HOSPITAL_COMMUNITY): Payer: Self-pay

## 2020-07-03 ENCOUNTER — Other Ambulatory Visit: Payer: Self-pay

## 2020-07-03 DIAGNOSIS — N184 Chronic kidney disease, stage 4 (severe): Secondary | ICD-10-CM | POA: Diagnosis not present

## 2020-07-03 LAB — POCT HEMOGLOBIN-HEMACUE: Hemoglobin: 9.4 g/dL — ABNORMAL LOW (ref 12.0–15.0)

## 2020-07-03 MED ORDER — EPOETIN ALFA 4000 UNIT/ML IJ SOLN
INTRAMUSCULAR | Status: AC
  Filled 2020-07-03: qty 2

## 2020-07-03 MED ORDER — EPOETIN ALFA 4000 UNIT/ML IJ SOLN
8000.0000 [IU] | Freq: Once | INTRAMUSCULAR | Status: AC
  Administered 2020-07-03: 8000 [IU] via SUBCUTANEOUS

## 2020-07-04 DIAGNOSIS — N189 Chronic kidney disease, unspecified: Secondary | ICD-10-CM | POA: Diagnosis not present

## 2020-07-04 DIAGNOSIS — R600 Localized edema: Secondary | ICD-10-CM | POA: Diagnosis not present

## 2020-07-04 DIAGNOSIS — I129 Hypertensive chronic kidney disease with stage 1 through stage 4 chronic kidney disease, or unspecified chronic kidney disease: Secondary | ICD-10-CM | POA: Diagnosis not present

## 2020-07-04 DIAGNOSIS — D631 Anemia in chronic kidney disease: Secondary | ICD-10-CM | POA: Diagnosis not present

## 2020-07-04 DIAGNOSIS — E1122 Type 2 diabetes mellitus with diabetic chronic kidney disease: Secondary | ICD-10-CM | POA: Diagnosis not present

## 2020-07-04 DIAGNOSIS — R809 Proteinuria, unspecified: Secondary | ICD-10-CM | POA: Diagnosis not present

## 2020-07-04 DIAGNOSIS — E1129 Type 2 diabetes mellitus with other diabetic kidney complication: Secondary | ICD-10-CM | POA: Diagnosis not present

## 2020-07-15 DIAGNOSIS — N184 Chronic kidney disease, stage 4 (severe): Secondary | ICD-10-CM | POA: Diagnosis not present

## 2020-07-15 DIAGNOSIS — E1122 Type 2 diabetes mellitus with diabetic chronic kidney disease: Secondary | ICD-10-CM | POA: Diagnosis not present

## 2020-07-15 DIAGNOSIS — Z23 Encounter for immunization: Secondary | ICD-10-CM | POA: Diagnosis not present

## 2020-07-15 DIAGNOSIS — R600 Localized edema: Secondary | ICD-10-CM | POA: Diagnosis not present

## 2020-07-15 DIAGNOSIS — I10 Essential (primary) hypertension: Secondary | ICD-10-CM | POA: Diagnosis not present

## 2020-07-17 ENCOUNTER — Encounter (HOSPITAL_COMMUNITY)
Admission: RE | Admit: 2020-07-17 | Discharge: 2020-07-17 | Disposition: A | Discharge status: Home / Self Care | Payer: Medicare PPO | Source: Ambulatory Visit | Attending: Nephrology | Admitting: Nephrology

## 2020-07-17 ENCOUNTER — Encounter (HOSPITAL_COMMUNITY): Payer: Self-pay

## 2020-07-17 ENCOUNTER — Other Ambulatory Visit: Payer: Self-pay

## 2020-07-17 DIAGNOSIS — N184 Chronic kidney disease, stage 4 (severe): Secondary | ICD-10-CM | POA: Diagnosis not present

## 2020-07-17 DIAGNOSIS — D631 Anemia in chronic kidney disease: Secondary | ICD-10-CM | POA: Insufficient documentation

## 2020-07-17 LAB — POCT HEMOGLOBIN-HEMACUE: Hemoglobin: 9 g/dL — ABNORMAL LOW (ref 12.0–15.0)

## 2020-07-17 MED ORDER — EPOETIN ALFA 4000 UNIT/ML IJ SOLN
8000.0000 [IU] | Freq: Once | INTRAMUSCULAR | Status: AC
  Administered 2020-07-17: 8000 [IU] via SUBCUTANEOUS

## 2020-07-17 MED ORDER — EPOETIN ALFA 4000 UNIT/ML IJ SOLN
INTRAMUSCULAR | Status: AC
  Filled 2020-07-17: qty 2

## 2020-07-31 ENCOUNTER — Other Ambulatory Visit: Payer: Self-pay

## 2020-07-31 ENCOUNTER — Encounter (HOSPITAL_COMMUNITY): Payer: Self-pay

## 2020-07-31 ENCOUNTER — Encounter (HOSPITAL_COMMUNITY)
Admission: RE | Admit: 2020-07-31 | Discharge: 2020-07-31 | Disposition: A | Discharge status: Home / Self Care | Payer: Medicare PPO | Source: Ambulatory Visit | Attending: Nephrology | Admitting: Nephrology

## 2020-07-31 DIAGNOSIS — N184 Chronic kidney disease, stage 4 (severe): Secondary | ICD-10-CM | POA: Diagnosis not present

## 2020-07-31 LAB — POCT HEMOGLOBIN-HEMACUE: Hemoglobin: 9.5 g/dL — ABNORMAL LOW (ref 12.0–15.0)

## 2020-07-31 MED ORDER — EPOETIN ALFA 4000 UNIT/ML IJ SOLN
8000.0000 [IU] | Freq: Once | INTRAMUSCULAR | Status: AC
  Administered 2020-07-31: 8000 [IU] via SUBCUTANEOUS
  Filled 2020-07-31: qty 2

## 2020-08-14 ENCOUNTER — Encounter (HOSPITAL_COMMUNITY)
Admission: RE | Admit: 2020-08-14 | Discharge: 2020-08-14 | Disposition: A | Discharge status: Home / Self Care | Payer: Medicare PPO | Source: Ambulatory Visit | Attending: Nephrology | Admitting: Nephrology

## 2020-08-14 ENCOUNTER — Other Ambulatory Visit: Payer: Self-pay

## 2020-08-14 ENCOUNTER — Encounter (HOSPITAL_COMMUNITY): Payer: Self-pay

## 2020-08-14 DIAGNOSIS — D631 Anemia in chronic kidney disease: Secondary | ICD-10-CM | POA: Insufficient documentation

## 2020-08-14 DIAGNOSIS — N185 Chronic kidney disease, stage 5: Secondary | ICD-10-CM | POA: Diagnosis not present

## 2020-08-14 LAB — RENAL FUNCTION PANEL
Albumin: 4.4 g/dL (ref 3.5–5.0)
Anion gap: 13 (ref 5–15)
BUN: 56 mg/dL — ABNORMAL HIGH (ref 8–23)
CO2: 25 mmol/L (ref 22–32)
Calcium: 9.6 mg/dL (ref 8.9–10.3)
Chloride: 100 mmol/L (ref 98–111)
Creatinine, Ser: 3.2 mg/dL — ABNORMAL HIGH (ref 0.44–1.00)
GFR, Estimated: 14 mL/min — ABNORMAL LOW (ref >60)
Glucose, Bld: 183 mg/dL — ABNORMAL HIGH (ref 70–99)
Phosphorus: 4.7 mg/dL — ABNORMAL HIGH (ref 2.5–4.6)
Potassium: 4.6 mmol/L (ref 3.5–5.1)
Sodium: 138 mmol/L (ref 135–145)

## 2020-08-14 LAB — CBC
HCT: 30.8 % — ABNORMAL LOW (ref 36.0–46.0)
Hemoglobin: 9.5 g/dL — ABNORMAL LOW (ref 12.0–15.0)
MCH: 30.9 pg (ref 26.0–34.0)
MCHC: 30.8 g/dL (ref 30.0–36.0)
MCV: 100.3 fL — ABNORMAL HIGH (ref 80.0–100.0)
Platelets: 232 10*3/uL (ref 150–400)
RBC: 3.07 MIL/uL — ABNORMAL LOW (ref 3.87–5.11)
RDW: 14.5 % (ref 11.5–15.5)
WBC: 4.8 10*3/uL (ref 4.0–10.5)
nRBC: 0 % (ref 0.0–0.2)

## 2020-08-14 LAB — POCT HEMOGLOBIN-HEMACUE: Hemoglobin: 9.4 g/dL — ABNORMAL LOW (ref 12.0–15.0)

## 2020-08-14 MED ORDER — EPOETIN ALFA 4000 UNIT/ML IJ SOLN
8000.0000 [IU] | Freq: Once | INTRAMUSCULAR | Status: AC
  Administered 2020-08-14: 8000 [IU] via SUBCUTANEOUS
  Filled 2020-08-14: qty 2

## 2020-08-15 DIAGNOSIS — E1129 Type 2 diabetes mellitus with other diabetic kidney complication: Secondary | ICD-10-CM | POA: Diagnosis not present

## 2020-08-15 DIAGNOSIS — E1122 Type 2 diabetes mellitus with diabetic chronic kidney disease: Secondary | ICD-10-CM | POA: Diagnosis not present

## 2020-08-15 DIAGNOSIS — I129 Hypertensive chronic kidney disease with stage 1 through stage 4 chronic kidney disease, or unspecified chronic kidney disease: Secondary | ICD-10-CM | POA: Diagnosis not present

## 2020-08-15 DIAGNOSIS — R809 Proteinuria, unspecified: Secondary | ICD-10-CM | POA: Diagnosis not present

## 2020-08-15 DIAGNOSIS — N189 Chronic kidney disease, unspecified: Secondary | ICD-10-CM | POA: Diagnosis not present

## 2020-08-15 DIAGNOSIS — N170 Acute kidney failure with tubular necrosis: Secondary | ICD-10-CM | POA: Diagnosis not present

## 2020-08-15 DIAGNOSIS — D631 Anemia in chronic kidney disease: Secondary | ICD-10-CM | POA: Diagnosis not present

## 2020-08-28 ENCOUNTER — Encounter (HOSPITAL_COMMUNITY): Payer: Self-pay

## 2020-08-28 ENCOUNTER — Encounter (HOSPITAL_COMMUNITY)
Admission: RE | Admit: 2020-08-28 | Discharge: 2020-08-28 | Disposition: A | Discharge status: Home / Self Care | Payer: Medicare PPO | Source: Ambulatory Visit | Attending: Nephrology | Admitting: Nephrology

## 2020-08-28 ENCOUNTER — Other Ambulatory Visit: Payer: Self-pay

## 2020-08-28 DIAGNOSIS — N185 Chronic kidney disease, stage 5: Secondary | ICD-10-CM | POA: Diagnosis not present

## 2020-08-28 LAB — POCT HEMOGLOBIN-HEMACUE: Hemoglobin: 9.1 g/dL — ABNORMAL LOW (ref 12.0–15.0)

## 2020-08-28 MED ORDER — EPOETIN ALFA 4000 UNIT/ML IJ SOLN
8000.0000 [IU] | Freq: Once | INTRAMUSCULAR | Status: AC
  Administered 2020-08-28: 8000 [IU] via SUBCUTANEOUS

## 2020-08-28 MED ORDER — EPOETIN ALFA 4000 UNIT/ML IJ SOLN
INTRAMUSCULAR | Status: AC
  Filled 2020-08-28: qty 2

## 2020-09-03 DIAGNOSIS — E1122 Type 2 diabetes mellitus with diabetic chronic kidney disease: Secondary | ICD-10-CM | POA: Diagnosis not present

## 2020-09-09 ENCOUNTER — Ambulatory Visit: Payer: Medicare PPO | Admitting: Podiatry

## 2020-09-09 ENCOUNTER — Encounter: Payer: Self-pay | Admitting: Podiatry

## 2020-09-09 ENCOUNTER — Other Ambulatory Visit: Payer: Self-pay

## 2020-09-09 DIAGNOSIS — M79675 Pain in left toe(s): Secondary | ICD-10-CM | POA: Diagnosis not present

## 2020-09-09 DIAGNOSIS — M2041 Other hammer toe(s) (acquired), right foot: Secondary | ICD-10-CM

## 2020-09-09 DIAGNOSIS — M2012 Hallux valgus (acquired), left foot: Secondary | ICD-10-CM

## 2020-09-09 DIAGNOSIS — E1121 Type 2 diabetes mellitus with diabetic nephropathy: Secondary | ICD-10-CM

## 2020-09-09 DIAGNOSIS — M2042 Other hammer toe(s) (acquired), left foot: Secondary | ICD-10-CM

## 2020-09-09 DIAGNOSIS — B351 Tinea unguium: Secondary | ICD-10-CM | POA: Diagnosis not present

## 2020-09-09 DIAGNOSIS — M79674 Pain in right toe(s): Secondary | ICD-10-CM | POA: Diagnosis not present

## 2020-09-09 DIAGNOSIS — Z794 Long term (current) use of insulin: Secondary | ICD-10-CM

## 2020-09-09 DIAGNOSIS — M2011 Hallux valgus (acquired), right foot: Secondary | ICD-10-CM

## 2020-09-11 ENCOUNTER — Encounter (HOSPITAL_COMMUNITY)
Admission: RE | Admit: 2020-09-11 | Discharge: 2020-09-11 | Disposition: A | Discharge status: Home / Self Care | Payer: Medicare PPO | Source: Ambulatory Visit | Attending: Nephrology | Admitting: Nephrology

## 2020-09-11 ENCOUNTER — Encounter (HOSPITAL_COMMUNITY): Payer: Self-pay

## 2020-09-11 ENCOUNTER — Other Ambulatory Visit: Payer: Self-pay

## 2020-09-11 DIAGNOSIS — N184 Chronic kidney disease, stage 4 (severe): Secondary | ICD-10-CM | POA: Diagnosis not present

## 2020-09-11 DIAGNOSIS — D631 Anemia in chronic kidney disease: Secondary | ICD-10-CM | POA: Diagnosis not present

## 2020-09-11 LAB — RENAL FUNCTION PANEL
Albumin: 4.2 g/dL (ref 3.5–5.0)
Anion gap: 9 (ref 5–15)
BUN: 57 mg/dL — ABNORMAL HIGH (ref 8–23)
CO2: 26 mmol/L (ref 22–32)
Calcium: 9.2 mg/dL (ref 8.9–10.3)
Chloride: 105 mmol/L (ref 98–111)
Creatinine, Ser: 3 mg/dL — ABNORMAL HIGH (ref 0.44–1.00)
GFR, Estimated: 15 mL/min — ABNORMAL LOW (ref >60)
Glucose, Bld: 192 mg/dL — ABNORMAL HIGH (ref 70–99)
Phosphorus: 3.6 mg/dL (ref 2.5–4.6)
Potassium: 4.3 mmol/L (ref 3.5–5.1)
Sodium: 140 mmol/L (ref 135–145)

## 2020-09-11 LAB — CBC
HCT: 31.9 % — ABNORMAL LOW (ref 36.0–46.0)
Hemoglobin: 9.6 g/dL — ABNORMAL LOW (ref 12.0–15.0)
MCH: 30.3 pg (ref 26.0–34.0)
MCHC: 30.1 g/dL (ref 30.0–36.0)
MCV: 100.6 fL — ABNORMAL HIGH (ref 80.0–100.0)
Platelets: 205 10*3/uL (ref 150–400)
RBC: 3.17 MIL/uL — ABNORMAL LOW (ref 3.87–5.11)
RDW: 14.4 % (ref 11.5–15.5)
WBC: 4.9 10*3/uL (ref 4.0–10.5)
nRBC: 0 % (ref 0.0–0.2)

## 2020-09-11 LAB — POCT HEMOGLOBIN-HEMACUE: Hemoglobin: 9.6 g/dL — ABNORMAL LOW (ref 12.0–15.0)

## 2020-09-11 MED ORDER — EPOETIN ALFA 4000 UNIT/ML IJ SOLN
8000.0000 [IU] | Freq: Once | INTRAMUSCULAR | Status: AC
  Administered 2020-09-11: 8000 [IU] via SUBCUTANEOUS
  Filled 2020-09-11: qty 2

## 2020-09-12 DIAGNOSIS — N170 Acute kidney failure with tubular necrosis: Secondary | ICD-10-CM | POA: Diagnosis not present

## 2020-09-12 DIAGNOSIS — R809 Proteinuria, unspecified: Secondary | ICD-10-CM | POA: Diagnosis not present

## 2020-09-12 DIAGNOSIS — I129 Hypertensive chronic kidney disease with stage 1 through stage 4 chronic kidney disease, or unspecified chronic kidney disease: Secondary | ICD-10-CM | POA: Diagnosis not present

## 2020-09-12 DIAGNOSIS — E1122 Type 2 diabetes mellitus with diabetic chronic kidney disease: Secondary | ICD-10-CM | POA: Diagnosis not present

## 2020-09-12 DIAGNOSIS — N189 Chronic kidney disease, unspecified: Secondary | ICD-10-CM | POA: Diagnosis not present

## 2020-09-12 DIAGNOSIS — D631 Anemia in chronic kidney disease: Secondary | ICD-10-CM | POA: Diagnosis not present

## 2020-09-12 DIAGNOSIS — E1129 Type 2 diabetes mellitus with other diabetic kidney complication: Secondary | ICD-10-CM | POA: Diagnosis not present

## 2020-09-12 NOTE — Progress Notes (Signed)
Subjective:  Patient ID: Stephanie Middleton, female    DOB: 1930-10-31,  MRN: [id number]  86 y.o. female presents with at risk foot care. Pt has h/o NIDDM with chronic kidney disease and painful thick toenails that are difficult to trim. Pain interferes with ambulation. Aggravating factors include wearing enclosed shoe gear. Pain is relieved with periodic professional debridement.   Stephanie Middleton relates tenderness to left 3rd digit toenail on today's visit..  Daughter is present during today's visit.  Review of Systems: Negative except as noted in the HPI.  Past Medical History:  Diagnosis Date  . Anemia   . Chronic kidney disease   . Chronic pain    Back, right hip and right knee  . Diabetes mellitus without complication ([place])   . Hypercholesteremia   . Hypertension    Past Surgical History:  Procedure Laterality Date  . CHOLECYSTECTOMY    . CYST EXCISION     Patient Active Problem List   Diagnosis Date Noted  . Anemia in chronic kidney disease 07/25/2010  . Chronic kidney disease 07/25/2010  . Essential (primary) hypertension 07/25/2010  . Low back pain 07/25/2010  . Pure hypercholesterolemia 07/25/2010  . Type 2 diabetes mellitus with diabetic nephropathy ([place]) 07/25/2010  . Type 2 diabetes mellitus with hyperglycemia ([place]) 07/25/2010    Current Outpatient Medications:  .  ACCU-CHEK GUIDE test strip, , Disp: , Rfl:  .  Accu-Chek Softclix Lancets lancets, , Disp: , Rfl:  .  acetaminophen (TYLENOL) 500 MG tablet, Take 500 mg by mouth 2 (two) times a day., Disp: , Rfl:  .  allopurinol (ZYLOPRIM) 100 MG tablet, Take 100 mg by mouth daily., Disp: , Rfl:  .  amLODipine (NORVASC) 5 MG tablet, Take 5 mg by mouth daily., Disp: , Rfl:  .  amLODipine-valsartan (EXFORGE) 10-160 MG tablet, Take 1 tablet by mouth daily. exforge HCT 5-160 mg/12.5 mg, Disp: , Rfl:  .  aspirin EC 81 MG tablet, Take 81 mg by mouth daily., Disp: , Rfl:  .  calcitRIOL (ROCALTROL) 0.25 MCG capsule, Take 0.25  mcg by mouth daily., Disp: , Rfl:  .  Cholecalciferol (D-3-5) 5000 units capsule, Take 5,000 Units by mouth daily., Disp: , Rfl:  .  cholestyramine light (PREVALITE) 4 g packet, Take 4 g by mouth 2 (two) times daily., Disp: , Rfl:  .  colchicine 0.6 MG tablet, Take 0.6 mg by mouth daily as needed., Disp: , Rfl:  .  epoetin alfa (EPOGEN) 4000 UNIT/ML injection, Inject 8,000 Units into the skin every 14 (fourteen) days. , Disp: , Rfl:  .  furosemide (LASIX) 20 MG tablet, Take 20 mg by mouth. Takes 2 tabs daily, Disp: , Rfl:  .  glipiZIDE (GLUCOTROL) 5 MG tablet, Take by mouth 2 (two) times daily before a meal. Take 2 tablets twice daily, Disp: , Rfl:  .  guanFACINE (TENEX) 2 MG tablet, Take 2 mg by mouth at bedtime., Disp: , Rfl:  .  hydrochlorothiazide (HYDRODIURIL) 12.5 MG tablet, , Disp: , Rfl:  .  HYDROcodone-acetaminophen (NORCO/VICODIN) 5-325 MG tablet, Take 1 tablet by mouth every 12 (twelve) hours as needed for moderate pain (take 1/2 tablet every 12 hours as needed)., Disp: , Rfl:  .  iron polysaccharides (NIFEREX) 150 MG capsule, Take 150 mg by mouth 2 (two) times daily., Disp: , Rfl:  .  labetalol (NORMODYNE) 200 MG tablet, Take 200 mg by mouth 2 (two) times daily. Take 2 tablets twice daily, Disp: , Rfl:  .  LEVEMIR  FLEXTOUCH 100 UNIT/ML FlexPen, , Disp: , Rfl:  .  linagliptin (TRADJENTA) 5 MG TABS tablet, Take 5 mg by mouth daily., Disp: , Rfl:  .  pravastatin (PRAVACHOL) 40 MG tablet, Take 40 mg by mouth at bedtime., Disp: , Rfl:  .  predniSONE (STERAPRED UNI-PAK 21 TAB) 10 MG (21) TBPK tablet, Take 10 mg by mouth daily. Finished dose pack on Monday 05/15/2019., Disp: , Rfl:  .  RABEprazole (ACIPHEX) 20 MG tablet, Take 20 mg by mouth daily as needed. 1 tablet by mouth everyday as needed for indigestion, Disp: , Rfl:  .  valsartan (DIOVAN) 160 MG tablet, Take 160 mg by mouth daily., Disp: , Rfl:  No Known Allergies Social History   Tobacco Use  Smoking Status Never Smoker  Smokeless  Tobacco Never Used   Objective:  There were no vitals filed for this visit. Constitutional Patient is a pleasant 86 y.o. African American female WD, WN in NAD.Marland Kitchen AAO x 3.  Vascular Capillary fill time to digits <3 seconds b/l lower extremities. Palpable pedal pulses b/l LE. Pedal hair sparse. Lower extremity skin temperature gradient within normal limits. No edema noted b/l lower extremities. No cyanosis or clubbing noted.  Neurologic Normal speech. Oriented to person, place, and time. Protective sensation intact 5/5 intact bilaterally with 10g monofilament b/l.  Dermatologic Pedal skin with normal turgor, texture and tone bilaterally. No open wounds bilaterally. No interdigital macerations bilaterally. Toenails 1-5 b/l elongated, discolored, dystrophic, thickened, crumbly with subungual debris and tenderness to dorsal palpation.  Orthopedic: Normal muscle strength 5/5 to all lower extremity muscle groups bilaterally. No pain crepitus or joint limitation noted with ROM b/l. Hallux valgus with bunion deformity noted b/l lower extremities. Hammertoes noted to the b/l lower extremities.   Hemoglobin A1C Latest Ref Rng & Units 01/31/2020  HGBA1C 4.8 - 5.6 % 6.5(H)  Some recent data might be hidden   Assessment:   1. Pain due to onychomycosis of toenails of both feet   2. Hammertoes of both feet   3. Acquired hallux valgus of both feet   4. Type 2 diabetes mellitus with diabetic nephropathy, with long-term current use of insulin ([place])    Plan:  Patient was evaluated and treated and all questions answered.  Onychomycosis with pain -Nails palliatively debridement as below. -Educated on self-care  Procedure: Nail Debridement Rationale: Pain Type of Debridement: manual, sharp debridement. Instrumentation: Nail nipper, rotary burr. Number of Nails: 10  -Examined patient. -No new findings. No new orders. -Continue diabetic foot care principles. -Toenails 1-5 b/l were debrided in length and  girth with sterile nail nippers and dremel without iatrogenic bleeding.  -Patient to report any pedal injuries to medical professional immediately. -Patient/POA to call should there be question/concern in the interim.  Return in about 3 months (around 12/08/2020) for diabetic foot care.  Marzetta Board, DPM

## 2020-09-25 ENCOUNTER — Other Ambulatory Visit: Payer: Self-pay

## 2020-09-25 ENCOUNTER — Encounter (HOSPITAL_COMMUNITY)
Admission: RE | Admit: 2020-09-25 | Discharge: 2020-09-25 | Disposition: A | Discharge status: Home / Self Care | Payer: Medicare PPO | Source: Ambulatory Visit | Attending: Nephrology | Admitting: Nephrology

## 2020-09-25 ENCOUNTER — Encounter (HOSPITAL_COMMUNITY): Payer: Self-pay

## 2020-09-25 DIAGNOSIS — N184 Chronic kidney disease, stage 4 (severe): Secondary | ICD-10-CM | POA: Diagnosis not present

## 2020-09-25 LAB — POCT HEMOGLOBIN-HEMACUE: Hemoglobin: 9.4 g/dL — ABNORMAL LOW (ref 12.0–15.0)

## 2020-09-25 MED ORDER — EPOETIN ALFA 4000 UNIT/ML IJ SOLN
8000.0000 [IU] | Freq: Once | INTRAMUSCULAR | Status: AC
  Administered 2020-09-25: 8000 [IU] via SUBCUTANEOUS

## 2020-09-25 MED ORDER — EPOETIN ALFA 4000 UNIT/ML IJ SOLN
INTRAMUSCULAR | Status: AC
  Filled 2020-09-25: qty 2

## 2020-10-09 ENCOUNTER — Encounter (HOSPITAL_COMMUNITY)
Admission: RE | Admit: 2020-10-09 | Discharge: 2020-10-09 | Disposition: A | Discharge status: Home / Self Care | Payer: Medicare PPO | Source: Ambulatory Visit | Attending: Nephrology | Admitting: Nephrology

## 2020-10-09 ENCOUNTER — Encounter (HOSPITAL_COMMUNITY): Payer: Self-pay

## 2020-10-09 ENCOUNTER — Other Ambulatory Visit: Payer: Self-pay

## 2020-10-09 DIAGNOSIS — N184 Chronic kidney disease, stage 4 (severe): Secondary | ICD-10-CM | POA: Diagnosis not present

## 2020-10-09 DIAGNOSIS — D631 Anemia in chronic kidney disease: Secondary | ICD-10-CM | POA: Diagnosis present

## 2020-10-09 LAB — POCT HEMOGLOBIN-HEMACUE: Hemoglobin: 9.6 g/dL — ABNORMAL LOW (ref 12.0–15.0)

## 2020-10-09 MED ORDER — EPOETIN ALFA 4000 UNIT/ML IJ SOLN
8000.0000 [IU] | Freq: Once | INTRAMUSCULAR | Status: AC
  Administered 2020-10-09: 8000 [IU] via SUBCUTANEOUS
  Filled 2020-10-09: qty 2

## 2020-10-15 DIAGNOSIS — D631 Anemia in chronic kidney disease: Secondary | ICD-10-CM | POA: Diagnosis not present

## 2020-10-15 DIAGNOSIS — N184 Chronic kidney disease, stage 4 (severe): Secondary | ICD-10-CM | POA: Diagnosis not present

## 2020-10-15 DIAGNOSIS — E1122 Type 2 diabetes mellitus with diabetic chronic kidney disease: Secondary | ICD-10-CM | POA: Diagnosis not present

## 2020-10-15 DIAGNOSIS — I10 Essential (primary) hypertension: Secondary | ICD-10-CM | POA: Diagnosis not present

## 2020-10-23 ENCOUNTER — Encounter (HOSPITAL_COMMUNITY)
Admission: RE | Admit: 2020-10-23 | Discharge: 2020-10-23 | Disposition: A | Discharge status: Home / Self Care | Payer: Medicare PPO | Source: Ambulatory Visit | Attending: Nephrology | Admitting: Nephrology

## 2020-10-23 ENCOUNTER — Encounter (HOSPITAL_COMMUNITY): Payer: Medicare PPO

## 2020-10-23 ENCOUNTER — Encounter (HOSPITAL_COMMUNITY): Admission: RE | Admit: 2020-10-23 | Payer: Medicare PPO | Source: Ambulatory Visit

## 2020-10-23 ENCOUNTER — Encounter (HOSPITAL_COMMUNITY): Payer: Self-pay

## 2020-10-23 ENCOUNTER — Other Ambulatory Visit: Payer: Self-pay

## 2020-10-23 DIAGNOSIS — N184 Chronic kidney disease, stage 4 (severe): Secondary | ICD-10-CM | POA: Diagnosis not present

## 2020-10-23 LAB — CBC
HCT: 31.2 % — ABNORMAL LOW (ref 36.0–46.0)
Hemoglobin: 9.7 g/dL — ABNORMAL LOW (ref 12.0–15.0)
MCH: 30.9 pg (ref 26.0–34.0)
MCHC: 31.1 g/dL (ref 30.0–36.0)
MCV: 99.4 fL (ref 80.0–100.0)
Platelets: 211 10*3/uL (ref 150–400)
RBC: 3.14 MIL/uL — ABNORMAL LOW (ref 3.87–5.11)
RDW: 14.7 % (ref 11.5–15.5)
WBC: 4.8 10*3/uL (ref 4.0–10.5)
nRBC: 0 % (ref 0.0–0.2)

## 2020-10-23 LAB — RENAL FUNCTION PANEL
Albumin: 4.5 g/dL (ref 3.5–5.0)
Anion gap: 12 (ref 5–15)
BUN: 69 mg/dL — ABNORMAL HIGH (ref 8–23)
CO2: 23 mmol/L (ref 22–32)
Calcium: 9.8 mg/dL (ref 8.9–10.3)
Chloride: 105 mmol/L (ref 98–111)
Creatinine, Ser: 2.89 mg/dL — ABNORMAL HIGH (ref 0.44–1.00)
GFR, Estimated: 15 mL/min — ABNORMAL LOW (ref >60)
Glucose, Bld: 125 mg/dL — ABNORMAL HIGH (ref 70–99)
Phosphorus: 4.4 mg/dL (ref 2.5–4.6)
Potassium: 4.2 mmol/L (ref 3.5–5.1)
Sodium: 140 mmol/L (ref 135–145)

## 2020-10-23 LAB — POCT HEMOGLOBIN-HEMACUE: Hemoglobin: 9.6 g/dL — ABNORMAL LOW (ref 12.0–15.0)

## 2020-10-23 MED ORDER — EPOETIN ALFA 4000 UNIT/ML IJ SOLN
4000.0000 [IU] | Freq: Once | INTRAMUSCULAR | Status: AC
  Administered 2020-10-23: 4000 [IU] via SUBCUTANEOUS

## 2020-10-23 MED ORDER — EPOETIN ALFA 4000 UNIT/ML IJ SOLN
INTRAMUSCULAR | Status: AC
  Filled 2020-10-23: qty 2

## 2020-10-24 DIAGNOSIS — R809 Proteinuria, unspecified: Secondary | ICD-10-CM | POA: Diagnosis not present

## 2020-10-24 DIAGNOSIS — I129 Hypertensive chronic kidney disease with stage 1 through stage 4 chronic kidney disease, or unspecified chronic kidney disease: Secondary | ICD-10-CM | POA: Diagnosis not present

## 2020-10-24 DIAGNOSIS — N189 Chronic kidney disease, unspecified: Secondary | ICD-10-CM | POA: Diagnosis not present

## 2020-10-24 DIAGNOSIS — E1129 Type 2 diabetes mellitus with other diabetic kidney complication: Secondary | ICD-10-CM | POA: Diagnosis not present

## 2020-10-24 DIAGNOSIS — E1122 Type 2 diabetes mellitus with diabetic chronic kidney disease: Secondary | ICD-10-CM | POA: Diagnosis not present

## 2020-10-24 DIAGNOSIS — D631 Anemia in chronic kidney disease: Secondary | ICD-10-CM | POA: Diagnosis not present

## 2020-10-24 LAB — PTH, INTACT AND CALCIUM
Calcium, Total (PTH): 9.6 mg/dL (ref 8.7–10.3)
PTH: 68 pg/mL — ABNORMAL HIGH (ref 15–65)

## 2020-11-06 ENCOUNTER — Encounter (HOSPITAL_COMMUNITY): Admission: RE | Admit: 2020-11-06 | Payer: Medicare PPO | Source: Ambulatory Visit

## 2020-11-06 ENCOUNTER — Encounter (HOSPITAL_COMMUNITY): Payer: Medicare PPO

## 2020-11-13 ENCOUNTER — Other Ambulatory Visit: Payer: Self-pay

## 2020-11-13 ENCOUNTER — Encounter (HOSPITAL_COMMUNITY)
Admission: RE | Admit: 2020-11-13 | Discharge: 2020-11-13 | Disposition: A | Discharge status: Home / Self Care | Payer: Medicare PPO | Source: Ambulatory Visit | Attending: Nephrology | Admitting: Nephrology

## 2020-11-13 ENCOUNTER — Encounter (HOSPITAL_COMMUNITY): Payer: Self-pay

## 2020-11-13 DIAGNOSIS — N184 Chronic kidney disease, stage 4 (severe): Secondary | ICD-10-CM | POA: Insufficient documentation

## 2020-11-13 DIAGNOSIS — D631 Anemia in chronic kidney disease: Secondary | ICD-10-CM | POA: Diagnosis present

## 2020-11-13 LAB — POCT HEMOGLOBIN-HEMACUE: Hemoglobin: 9.2 g/dL — ABNORMAL LOW (ref 12.0–15.0)

## 2020-11-13 MED ORDER — EPOETIN ALFA 4000 UNIT/ML IJ SOLN
4000.0000 [IU] | Freq: Once | INTRAMUSCULAR | Status: AC
  Administered 2020-11-13: 4000 [IU] via SUBCUTANEOUS

## 2020-11-13 MED ORDER — EPOETIN ALFA 4000 UNIT/ML IJ SOLN
INTRAMUSCULAR | Status: AC
  Filled 2020-11-13: qty 2

## 2020-11-27 ENCOUNTER — Encounter (HOSPITAL_COMMUNITY): Payer: Self-pay

## 2020-11-27 ENCOUNTER — Other Ambulatory Visit: Payer: Self-pay

## 2020-11-27 ENCOUNTER — Encounter (HOSPITAL_COMMUNITY)
Admission: RE | Admit: 2020-11-27 | Discharge: 2020-11-27 | Disposition: A | Discharge status: Home / Self Care | Payer: Medicare PPO | Source: Ambulatory Visit | Attending: Nephrology | Admitting: Nephrology

## 2020-11-27 DIAGNOSIS — N184 Chronic kidney disease, stage 4 (severe): Secondary | ICD-10-CM | POA: Diagnosis not present

## 2020-11-27 LAB — POCT HEMOGLOBIN-HEMACUE: Hemoglobin: 9.2 g/dL — ABNORMAL LOW (ref 12.0–15.0)

## 2020-11-27 LAB — PROTEIN / CREATININE RATIO, URINE
Creatinine, Urine: 119.66 mg/dL
Protein Creatinine Ratio: 0.32 mg/mg{Cre} — ABNORMAL HIGH (ref 0.00–0.15)
Total Protein, Urine: 38 mg/dL

## 2020-11-27 MED ORDER — EPOETIN ALFA 4000 UNIT/ML IJ SOLN
4000.0000 [IU] | Freq: Once | INTRAMUSCULAR | Status: AC
  Administered 2020-11-27: 4000 [IU] via SUBCUTANEOUS

## 2020-11-27 MED ORDER — EPOETIN ALFA 4000 UNIT/ML IJ SOLN
INTRAMUSCULAR | Status: AC
  Filled 2020-11-27: qty 2

## 2020-12-04 DIAGNOSIS — E1122 Type 2 diabetes mellitus with diabetic chronic kidney disease: Secondary | ICD-10-CM | POA: Diagnosis not present

## 2020-12-09 ENCOUNTER — Other Ambulatory Visit: Payer: Self-pay

## 2020-12-09 ENCOUNTER — Encounter: Payer: Self-pay | Admitting: Podiatry

## 2020-12-09 ENCOUNTER — Ambulatory Visit: Payer: Medicare PPO | Admitting: Podiatry

## 2020-12-09 DIAGNOSIS — Z794 Long term (current) use of insulin: Secondary | ICD-10-CM | POA: Diagnosis not present

## 2020-12-09 DIAGNOSIS — E1121 Type 2 diabetes mellitus with diabetic nephropathy: Secondary | ICD-10-CM

## 2020-12-09 DIAGNOSIS — M79674 Pain in right toe(s): Secondary | ICD-10-CM | POA: Diagnosis not present

## 2020-12-09 DIAGNOSIS — M2041 Other hammer toe(s) (acquired), right foot: Secondary | ICD-10-CM

## 2020-12-09 DIAGNOSIS — B351 Tinea unguium: Secondary | ICD-10-CM

## 2020-12-09 DIAGNOSIS — M79675 Pain in left toe(s): Secondary | ICD-10-CM | POA: Diagnosis not present

## 2020-12-09 DIAGNOSIS — M2011 Hallux valgus (acquired), right foot: Secondary | ICD-10-CM

## 2020-12-09 DIAGNOSIS — M2042 Other hammer toe(s) (acquired), left foot: Secondary | ICD-10-CM

## 2020-12-09 DIAGNOSIS — M2012 Hallux valgus (acquired), left foot: Secondary | ICD-10-CM

## 2020-12-09 NOTE — Progress Notes (Signed)
  Subjective:  Patient ID: Stephanie Middleton, female    DOB: 1931/03/19,  MRN: [id number]  87 y.o. female presents with at risk foot care. Pt has h/o NIDDM with chronic kidney disease and painful thick toenails that are difficult to trim. Pain interferes with ambulation. Aggravating factors include wearing enclosed shoe gear. Pain is relieved with periodic professional debridement.   Her daughter is present during today's visit. Stephanie Middleton states her toes are really long and tender. Her blood glucose was 112 mg/dl this morning.  PCP is Dr. Iona Middleton and last visit was 09/09/2020.  Review of Systems: Negative except as noted in the HPI.   No Known Allergies   Objective:  There were no vitals filed for this visit. Constitutional Patient is a pleasant 87 y.o. African American female WD, WN in NAD.Marland Kitchen AAO x 3.  Vascular Capillary fill time to digits <3 seconds b/l lower extremities. Palpable pedal pulses b/l LE. Pedal hair sparse. Lower extremity skin temperature gradient within normal limits. No edema noted b/l lower extremities. No cyanosis or clubbing noted.  Neurologic Normal speech. Oriented to person, place, and time. Protective sensation intact 5/5 intact bilaterally with 10g monofilament b/l.  Dermatologic Pedal skin with normal turgor, texture and tone bilaterally. No open wounds bilaterally. No interdigital macerations bilaterally. Toenails 1-5 b/l elongated, discolored, dystrophic, thickened, crumbly with subungual debris and tenderness to dorsal palpation.  Orthopedic: Normal muscle strength 5/5 to all lower extremity muscle groups bilaterally. No pain crepitus or joint limitation noted with ROM b/l. Hallux valgus with bunion deformity noted b/l lower extremities. Hammertoes noted to the b/l lower extremities.   Assessment:   1. Pain due to onychomycosis of toenails of both feet   2. Hammertoes of both feet   3. Acquired hallux valgus of both feet   4. Type 2 diabetes mellitus with  diabetic nephropathy, with long-term current use of insulin ([place])    Plan:  Patient was evaluated and treated and all questions answered.  Onychomycosis with pain -Nails palliatively debridement as below. -Educated on self-care  Procedure: Nail Debridement Rationale: Pain Type of Debridement: manual, sharp debridement. Instrumentation: Nail nipper, rotary burr. Number of Nails: 10  -Examined patient. -No new findings. No new orders. -Continue diabetic foot care principles. -Toenails 1-5 b/l were debrided in length and girth with sterile nail nippers and dremel without iatrogenic bleeding.  -Patient to report any pedal injuries to medical professional immediately. -Patient/POA to call should there be question/concern in the interim.  Return in about 3 months (around 03/11/2021).  Marzetta Board, DPM

## 2020-12-11 ENCOUNTER — Other Ambulatory Visit: Payer: Self-pay

## 2020-12-11 ENCOUNTER — Encounter (HOSPITAL_COMMUNITY)
Admission: RE | Admit: 2020-12-11 | Discharge: 2020-12-11 | Disposition: A | Discharge status: Home / Self Care | Payer: Medicare PPO | Source: Ambulatory Visit | Attending: Nephrology | Admitting: Nephrology

## 2020-12-11 ENCOUNTER — Encounter (HOSPITAL_COMMUNITY): Payer: Self-pay

## 2020-12-11 DIAGNOSIS — D631 Anemia in chronic kidney disease: Secondary | ICD-10-CM | POA: Diagnosis not present

## 2020-12-11 DIAGNOSIS — N189 Chronic kidney disease, unspecified: Secondary | ICD-10-CM | POA: Diagnosis not present

## 2020-12-11 DIAGNOSIS — E1122 Type 2 diabetes mellitus with diabetic chronic kidney disease: Secondary | ICD-10-CM | POA: Diagnosis not present

## 2020-12-11 DIAGNOSIS — R809 Proteinuria, unspecified: Secondary | ICD-10-CM | POA: Diagnosis not present

## 2020-12-11 DIAGNOSIS — E1129 Type 2 diabetes mellitus with other diabetic kidney complication: Secondary | ICD-10-CM | POA: Diagnosis not present

## 2020-12-11 DIAGNOSIS — N184 Chronic kidney disease, stage 4 (severe): Secondary | ICD-10-CM | POA: Insufficient documentation

## 2020-12-11 DIAGNOSIS — I129 Hypertensive chronic kidney disease with stage 1 through stage 4 chronic kidney disease, or unspecified chronic kidney disease: Secondary | ICD-10-CM | POA: Diagnosis not present

## 2020-12-11 LAB — POCT HEMOGLOBIN-HEMACUE: Hemoglobin: 9.5 g/dL — ABNORMAL LOW (ref 12.0–15.0)

## 2020-12-11 LAB — CBC
HCT: 32.3 % — ABNORMAL LOW (ref 36.0–46.0)
Hemoglobin: 9.9 g/dL — ABNORMAL LOW (ref 12.0–15.0)
MCH: 30.9 pg (ref 26.0–34.0)
MCHC: 30.7 g/dL (ref 30.0–36.0)
MCV: 100.9 fL — ABNORMAL HIGH (ref 80.0–100.0)
Platelets: 199 10*3/uL (ref 150–400)
RBC: 3.2 MIL/uL — ABNORMAL LOW (ref 3.87–5.11)
RDW: 14.7 % (ref 11.5–15.5)
WBC: 4.7 10*3/uL (ref 4.0–10.5)
nRBC: 0 % (ref 0.0–0.2)

## 2020-12-11 LAB — RENAL FUNCTION PANEL
Albumin: 4.4 g/dL (ref 3.5–5.0)
Anion gap: 11 (ref 5–15)
BUN: 55 mg/dL — ABNORMAL HIGH (ref 8–23)
CO2: 25 mmol/L (ref 22–32)
Calcium: 9.6 mg/dL (ref 8.9–10.3)
Chloride: 103 mmol/L (ref 98–111)
Creatinine, Ser: 2.5 mg/dL — ABNORMAL HIGH (ref 0.44–1.00)
GFR, Estimated: 18 mL/min — ABNORMAL LOW (ref >60)
Glucose, Bld: 201 mg/dL — ABNORMAL HIGH (ref 70–99)
Phosphorus: 3.9 mg/dL (ref 2.5–4.6)
Potassium: 4.5 mmol/L (ref 3.5–5.1)
Sodium: 139 mmol/L (ref 135–145)

## 2020-12-11 MED ORDER — EPOETIN ALFA 4000 UNIT/ML IJ SOLN
8000.0000 [IU] | Freq: Once | INTRAMUSCULAR | Status: AC
  Administered 2020-12-11: 8000 [IU] via SUBCUTANEOUS
  Filled 2020-12-11: qty 2

## 2020-12-25 ENCOUNTER — Encounter (HOSPITAL_COMMUNITY): Payer: Self-pay

## 2020-12-25 ENCOUNTER — Encounter (HOSPITAL_COMMUNITY)
Admission: RE | Admit: 2020-12-25 | Discharge: 2020-12-25 | Disposition: A | Discharge status: Home / Self Care | Payer: Medicare PPO | Source: Ambulatory Visit | Attending: Nephrology | Admitting: Nephrology

## 2020-12-25 ENCOUNTER — Other Ambulatory Visit: Payer: Self-pay

## 2020-12-25 DIAGNOSIS — N184 Chronic kidney disease, stage 4 (severe): Secondary | ICD-10-CM | POA: Diagnosis not present

## 2020-12-25 LAB — POCT HEMOGLOBIN-HEMACUE: Hemoglobin: 8.9 g/dL — ABNORMAL LOW (ref 12.0–15.0)

## 2020-12-25 MED ORDER — EPOETIN ALFA 4000 UNIT/ML IJ SOLN
INTRAMUSCULAR | Status: AC
  Filled 2020-12-25: qty 2

## 2020-12-25 MED ORDER — EPOETIN ALFA 4000 UNIT/ML IJ SOLN
8000.0000 [IU] | Freq: Once | INTRAMUSCULAR | Status: AC
  Administered 2020-12-25: 8000 [IU] via SUBCUTANEOUS

## 2021-01-08 ENCOUNTER — Encounter (HOSPITAL_COMMUNITY)
Admission: RE | Admit: 2021-01-08 | Discharge: 2021-01-08 | Disposition: A | Discharge status: Home / Self Care | Payer: Medicare PPO | Source: Ambulatory Visit | Attending: Nephrology | Admitting: Nephrology

## 2021-01-08 ENCOUNTER — Encounter (HOSPITAL_COMMUNITY): Payer: Self-pay

## 2021-01-08 ENCOUNTER — Other Ambulatory Visit: Payer: Self-pay

## 2021-01-08 DIAGNOSIS — N184 Chronic kidney disease, stage 4 (severe): Secondary | ICD-10-CM | POA: Insufficient documentation

## 2021-01-08 LAB — RENAL FUNCTION PANEL
Albumin: 4.4 g/dL (ref 3.5–5.0)
Anion gap: 12 (ref 5–15)
BUN: 58 mg/dL — ABNORMAL HIGH (ref 8–23)
CO2: 25 mmol/L (ref 22–32)
Calcium: 9.6 mg/dL (ref 8.9–10.3)
Chloride: 104 mmol/L (ref 98–111)
Creatinine, Ser: 2.76 mg/dL — ABNORMAL HIGH (ref 0.44–1.00)
GFR, Estimated: 16 mL/min — ABNORMAL LOW (ref >60)
Glucose, Bld: 128 mg/dL — ABNORMAL HIGH (ref 70–99)
Phosphorus: 3.6 mg/dL (ref 2.5–4.6)
Potassium: 4.4 mmol/L (ref 3.5–5.1)
Sodium: 141 mmol/L (ref 135–145)

## 2021-01-08 LAB — CBC
HCT: 30.3 % — ABNORMAL LOW (ref 36.0–46.0)
Hemoglobin: 9.3 g/dL — ABNORMAL LOW (ref 12.0–15.0)
MCH: 31.2 pg (ref 26.0–34.0)
MCHC: 30.7 g/dL (ref 30.0–36.0)
MCV: 101.7 fL — ABNORMAL HIGH (ref 80.0–100.0)
Platelets: 198 10*3/uL (ref 150–400)
RBC: 2.98 MIL/uL — ABNORMAL LOW (ref 3.87–5.11)
RDW: 14.6 % (ref 11.5–15.5)
WBC: 4 10*3/uL (ref 4.0–10.5)
nRBC: 0 % (ref 0.0–0.2)

## 2021-01-08 LAB — IRON AND TIBC
Iron: 65 ug/dL (ref 28–170)
Saturation Ratios: 22 % (ref 10.4–31.8)
TIBC: 292 ug/dL (ref 250–450)
UIBC: 227 ug/dL

## 2021-01-08 MED ORDER — EPOETIN ALFA 4000 UNIT/ML IJ SOLN
8000.0000 [IU] | Freq: Once | INTRAMUSCULAR | Status: AC
  Administered 2021-01-08: 8000 [IU] via SUBCUTANEOUS
  Filled 2021-01-08: qty 2

## 2021-01-09 LAB — PARATHYROID HORMONE, INTACT (NO CA): PTH: 65 pg/mL (ref 15–65)

## 2021-01-14 DIAGNOSIS — E1122 Type 2 diabetes mellitus with diabetic chronic kidney disease: Secondary | ICD-10-CM | POA: Diagnosis not present

## 2021-01-14 DIAGNOSIS — D631 Anemia in chronic kidney disease: Secondary | ICD-10-CM | POA: Diagnosis not present

## 2021-01-14 DIAGNOSIS — I10 Essential (primary) hypertension: Secondary | ICD-10-CM | POA: Diagnosis not present

## 2021-01-14 DIAGNOSIS — N184 Chronic kidney disease, stage 4 (severe): Secondary | ICD-10-CM | POA: Diagnosis not present

## 2021-01-16 LAB — POCT HEMOGLOBIN-HEMACUE: Hemoglobin: 8.8 g/dL — ABNORMAL LOW (ref 12.0–15.0)

## 2021-01-22 ENCOUNTER — Encounter (HOSPITAL_COMMUNITY)
Admission: RE | Admit: 2021-01-22 | Discharge: 2021-01-22 | Disposition: A | Discharge status: Home / Self Care | Payer: Medicare PPO | Source: Ambulatory Visit | Attending: Nephrology | Admitting: Nephrology

## 2021-01-22 ENCOUNTER — Other Ambulatory Visit: Payer: Self-pay

## 2021-01-22 DIAGNOSIS — N184 Chronic kidney disease, stage 4 (severe): Secondary | ICD-10-CM | POA: Diagnosis not present

## 2021-01-22 LAB — PROTEIN / CREATININE RATIO, URINE
Creatinine, Urine: 105.19 mg/dL
Protein Creatinine Ratio: 0.38 mg/mg{Cre} — ABNORMAL HIGH (ref 0.00–0.15)
Total Protein, Urine: 40 mg/dL

## 2021-01-22 LAB — POCT HEMOGLOBIN-HEMACUE: Hemoglobin: 9.4 g/dL — ABNORMAL LOW (ref 12.0–15.0)

## 2021-01-22 MED ORDER — EPOETIN ALFA 4000 UNIT/ML IJ SOLN
INTRAMUSCULAR | Status: AC
  Administered 2021-01-22: 8000 [IU]
  Filled 2021-01-22: qty 2

## 2021-01-22 MED ORDER — EPOETIN ALFA 4000 UNIT/ML IJ SOLN: 8000.0000 [IU] | INTRAMUSCULAR | Status: DC

## 2021-02-05 ENCOUNTER — Other Ambulatory Visit: Payer: Self-pay

## 2021-02-05 ENCOUNTER — Encounter (HOSPITAL_COMMUNITY)
Admission: RE | Admit: 2021-02-05 | Discharge: 2021-02-05 | Disposition: A | Discharge status: Home / Self Care | Payer: Medicare PPO | Source: Ambulatory Visit | Attending: Nephrology | Admitting: Nephrology

## 2021-02-05 DIAGNOSIS — N184 Chronic kidney disease, stage 4 (severe): Secondary | ICD-10-CM | POA: Diagnosis not present

## 2021-02-05 DIAGNOSIS — D631 Anemia in chronic kidney disease: Secondary | ICD-10-CM | POA: Diagnosis not present

## 2021-02-05 LAB — CBC WITH DIFFERENTIAL/PLATELET
Abs Immature Granulocytes: 0.04 10*3/uL (ref 0.00–0.07)
Basophils Absolute: 0 10*3/uL (ref 0.0–0.1)
Basophils Relative: 1 %
Eosinophils Absolute: 0.2 10*3/uL (ref 0.0–0.5)
Eosinophils Relative: 3 %
HCT: 30.8 % — ABNORMAL LOW (ref 36.0–46.0)
Hemoglobin: 9.4 g/dL — ABNORMAL LOW (ref 12.0–15.0)
Immature Granulocytes: 1 %
Lymphocytes Relative: 22 %
Lymphs Abs: 1 10*3/uL (ref 0.7–4.0)
MCH: 31 pg (ref 26.0–34.0)
MCHC: 30.5 g/dL (ref 30.0–36.0)
MCV: 101.7 fL — ABNORMAL HIGH (ref 80.0–100.0)
Monocytes Absolute: 0.3 10*3/uL (ref 0.1–1.0)
Monocytes Relative: 8 %
Neutro Abs: 2.9 10*3/uL (ref 1.7–7.7)
Neutrophils Relative %: 65 %
Platelets: 211 10*3/uL (ref 150–400)
RBC: 3.03 MIL/uL — ABNORMAL LOW (ref 3.87–5.11)
RDW: 14.8 % (ref 11.5–15.5)
WBC: 4.4 10*3/uL (ref 4.0–10.5)
nRBC: 0 % (ref 0.0–0.2)

## 2021-02-05 LAB — RENAL FUNCTION PANEL
Albumin: 4.3 g/dL (ref 3.5–5.0)
Anion gap: 10 (ref 5–15)
BUN: 56 mg/dL — ABNORMAL HIGH (ref 8–23)
CO2: 25 mmol/L (ref 22–32)
Calcium: 9.5 mg/dL (ref 8.9–10.3)
Chloride: 105 mmol/L (ref 98–111)
Creatinine, Ser: 2.62 mg/dL — ABNORMAL HIGH (ref 0.44–1.00)
GFR, Estimated: 17 mL/min — ABNORMAL LOW (ref >60)
Glucose, Bld: 186 mg/dL — ABNORMAL HIGH (ref 70–99)
Phosphorus: 3.6 mg/dL (ref 2.5–4.6)
Potassium: 4.1 mmol/L (ref 3.5–5.1)
Sodium: 140 mmol/L (ref 135–145)

## 2021-02-05 LAB — IRON AND TIBC
Iron: 69 ug/dL (ref 28–170)
Saturation Ratios: 24 % (ref 10.4–31.8)
TIBC: 285 ug/dL (ref 250–450)
UIBC: 216 ug/dL

## 2021-02-05 LAB — POCT HEMOGLOBIN-HEMACUE: Hemoglobin: 9.3 g/dL — ABNORMAL LOW (ref 12.0–15.0)

## 2021-02-05 MED ORDER — EPOETIN ALFA 4000 UNIT/ML IJ SOLN
INTRAMUSCULAR | Status: AC
  Administered 2021-02-05: 8000 [IU]
  Filled 2021-02-05: qty 2

## 2021-02-05 MED ORDER — EPOETIN ALFA 10000 UNIT/ML IJ SOLN
8000.0000 [IU] | Freq: Once | INTRAMUSCULAR | Status: DC
Start: 2021-02-05 — End: 2021-02-06

## 2021-02-05 MED ORDER — EPOETIN ALFA 2000 UNIT/ML IJ SOLN: 8000.0000 [IU] | Freq: Once | INTRAMUSCULAR | Status: DC

## 2021-02-06 DIAGNOSIS — N189 Chronic kidney disease, unspecified: Secondary | ICD-10-CM | POA: Diagnosis not present

## 2021-02-06 DIAGNOSIS — D631 Anemia in chronic kidney disease: Secondary | ICD-10-CM | POA: Diagnosis not present

## 2021-02-06 DIAGNOSIS — E119 Type 2 diabetes mellitus without complications: Secondary | ICD-10-CM | POA: Diagnosis not present

## 2021-02-06 DIAGNOSIS — R600 Localized edema: Secondary | ICD-10-CM | POA: Diagnosis not present

## 2021-02-06 DIAGNOSIS — E1129 Type 2 diabetes mellitus with other diabetic kidney complication: Secondary | ICD-10-CM | POA: Diagnosis not present

## 2021-02-06 DIAGNOSIS — E1122 Type 2 diabetes mellitus with diabetic chronic kidney disease: Secondary | ICD-10-CM | POA: Diagnosis not present

## 2021-02-06 DIAGNOSIS — I129 Hypertensive chronic kidney disease with stage 1 through stage 4 chronic kidney disease, or unspecified chronic kidney disease: Secondary | ICD-10-CM | POA: Diagnosis not present

## 2021-02-06 DIAGNOSIS — R809 Proteinuria, unspecified: Secondary | ICD-10-CM | POA: Diagnosis not present

## 2021-02-06 LAB — PTH, INTACT AND CALCIUM
Calcium, Total (PTH): 9.6 mg/dL (ref 8.7–10.3)
PTH: 80 pg/mL — ABNORMAL HIGH (ref 15–65)

## 2021-02-19 ENCOUNTER — Encounter (HOSPITAL_COMMUNITY)
Admission: RE | Admit: 2021-02-19 | Discharge: 2021-02-19 | Disposition: A | Discharge status: Home / Self Care | Payer: Medicare PPO | Source: Ambulatory Visit | Attending: Nephrology | Admitting: Nephrology

## 2021-02-19 ENCOUNTER — Encounter (HOSPITAL_COMMUNITY): Payer: Self-pay

## 2021-02-19 ENCOUNTER — Other Ambulatory Visit: Payer: Self-pay

## 2021-02-19 DIAGNOSIS — N184 Chronic kidney disease, stage 4 (severe): Secondary | ICD-10-CM | POA: Diagnosis not present

## 2021-02-19 DIAGNOSIS — D631 Anemia in chronic kidney disease: Secondary | ICD-10-CM | POA: Diagnosis not present

## 2021-02-19 LAB — POCT HEMOGLOBIN-HEMACUE: Hemoglobin: 9.4 g/dL — ABNORMAL LOW (ref 12.0–15.0)

## 2021-02-19 MED ORDER — EPOETIN ALFA 4000 UNIT/ML IJ SOLN
INTRAMUSCULAR | Status: AC
  Filled 2021-02-19: qty 1

## 2021-02-19 MED ORDER — EPOETIN ALFA 4000 UNIT/ML IJ SOLN
8000.0000 [IU] | Freq: Once | INTRAMUSCULAR | Status: AC
  Administered 2021-02-19: 8000 [IU] via SUBCUTANEOUS

## 2021-02-19 MED ORDER — EPOETIN ALFA 10000 UNIT/ML IJ SOLN
INTRAMUSCULAR | Status: AC
  Filled 2021-02-19: qty 1

## 2021-03-05 ENCOUNTER — Encounter (HOSPITAL_COMMUNITY): Payer: Self-pay

## 2021-03-05 ENCOUNTER — Encounter (HOSPITAL_COMMUNITY)
Admission: RE | Admit: 2021-03-05 | Discharge: 2021-03-05 | Disposition: A | Discharge status: Home / Self Care | Payer: Medicare PPO | Source: Ambulatory Visit | Attending: Nephrology | Admitting: Nephrology

## 2021-03-05 ENCOUNTER — Other Ambulatory Visit: Payer: Self-pay

## 2021-03-05 DIAGNOSIS — N184 Chronic kidney disease, stage 4 (severe): Secondary | ICD-10-CM | POA: Diagnosis not present

## 2021-03-05 LAB — POCT HEMOGLOBIN-HEMACUE: Hemoglobin: 9.5 g/dL — ABNORMAL LOW (ref 12.0–15.0)

## 2021-03-05 MED ORDER — EPOETIN ALFA 4000 UNIT/ML IJ SOLN
8000.0000 [IU] | Freq: Once | INTRAMUSCULAR | Status: AC
  Administered 2021-03-05: 8000 [IU] via SUBCUTANEOUS
  Filled 2021-03-05: qty 2

## 2021-03-19 ENCOUNTER — Encounter (HOSPITAL_COMMUNITY): Payer: Self-pay

## 2021-03-19 ENCOUNTER — Other Ambulatory Visit: Payer: Self-pay

## 2021-03-19 ENCOUNTER — Encounter (HOSPITAL_COMMUNITY)
Admission: RE | Admit: 2021-03-19 | Discharge: 2021-03-19 | Disposition: A | Discharge status: Home / Self Care | Payer: Medicare PPO | Source: Ambulatory Visit | Attending: Nephrology | Admitting: Nephrology

## 2021-03-19 DIAGNOSIS — N184 Chronic kidney disease, stage 4 (severe): Secondary | ICD-10-CM | POA: Diagnosis not present

## 2021-03-19 LAB — IRON AND TIBC
Iron: 60 ug/dL (ref 28–170)
Saturation Ratios: 21 % (ref 10.4–31.8)
TIBC: 292 ug/dL (ref 250–450)
UIBC: 232 ug/dL

## 2021-03-19 LAB — RENAL FUNCTION PANEL
Albumin: 4.3 g/dL (ref 3.5–5.0)
Anion gap: 10 (ref 5–15)
BUN: 64 mg/dL — ABNORMAL HIGH (ref 8–23)
CO2: 24 mmol/L (ref 22–32)
Calcium: 9.7 mg/dL (ref 8.9–10.3)
Chloride: 108 mmol/L (ref 98–111)
Creatinine, Ser: 3.01 mg/dL — ABNORMAL HIGH (ref 0.44–1.00)
GFR, Estimated: 15 mL/min — ABNORMAL LOW (ref >60)
Glucose, Bld: 240 mg/dL — ABNORMAL HIGH (ref 70–99)
Phosphorus: 4.2 mg/dL (ref 2.5–4.6)
Potassium: 4 mmol/L (ref 3.5–5.1)
Sodium: 142 mmol/L (ref 135–145)

## 2021-03-19 LAB — CBC WITH DIFFERENTIAL/PLATELET
Abs Immature Granulocytes: 0.04 10*3/uL (ref 0.00–0.07)
Basophils Absolute: 0 10*3/uL (ref 0.0–0.1)
Basophils Relative: 1 %
Eosinophils Absolute: 0.1 10*3/uL (ref 0.0–0.5)
Eosinophils Relative: 3 %
HCT: 30.3 % — ABNORMAL LOW (ref 36.0–46.0)
Hemoglobin: 9.4 g/dL — ABNORMAL LOW (ref 12.0–15.0)
Immature Granulocytes: 1 %
Lymphocytes Relative: 24 %
Lymphs Abs: 1 10*3/uL (ref 0.7–4.0)
MCH: 31.2 pg (ref 26.0–34.0)
MCHC: 31 g/dL (ref 30.0–36.0)
MCV: 100.7 fL — ABNORMAL HIGH (ref 80.0–100.0)
Monocytes Absolute: 0.3 10*3/uL (ref 0.1–1.0)
Monocytes Relative: 7 %
Neutro Abs: 2.9 10*3/uL (ref 1.7–7.7)
Neutrophils Relative %: 64 %
Platelets: 195 10*3/uL (ref 150–400)
RBC: 3.01 MIL/uL — ABNORMAL LOW (ref 3.87–5.11)
RDW: 14.9 % (ref 11.5–15.5)
WBC: 4.4 10*3/uL (ref 4.0–10.5)
nRBC: 0 % (ref 0.0–0.2)

## 2021-03-19 LAB — POCT HEMOGLOBIN-HEMACUE: Hemoglobin: 9.3 g/dL — ABNORMAL LOW (ref 12.0–15.0)

## 2021-03-19 MED ORDER — EPOETIN ALFA 4000 UNIT/ML IJ SOLN
8000.0000 [IU] | Freq: Once | INTRAMUSCULAR | Status: AC
  Administered 2021-03-19: 8000 [IU] via SUBCUTANEOUS
  Filled 2021-03-19: qty 2

## 2021-03-20 LAB — PTH, INTACT AND CALCIUM
Calcium, Total (PTH): 9.5 mg/dL (ref 8.7–10.3)
PTH: 111 pg/mL — ABNORMAL HIGH (ref 15–65)

## 2021-03-25 ENCOUNTER — Encounter: Payer: Self-pay | Admitting: Podiatry

## 2021-03-25 ENCOUNTER — Ambulatory Visit: Payer: Medicare PPO | Admitting: Podiatry

## 2021-03-25 ENCOUNTER — Other Ambulatory Visit: Payer: Self-pay

## 2021-03-25 DIAGNOSIS — M79674 Pain in right toe(s): Secondary | ICD-10-CM | POA: Diagnosis not present

## 2021-03-25 DIAGNOSIS — B351 Tinea unguium: Secondary | ICD-10-CM | POA: Diagnosis not present

## 2021-03-25 DIAGNOSIS — M79675 Pain in left toe(s): Secondary | ICD-10-CM

## 2021-03-25 NOTE — Progress Notes (Signed)
  Subjective:  Patient ID: Stephanie Middleton, female    DOB: Apr 05, 1931,  MRN: [id number]  87 y.o. female presents at risk foot care. Pt has h/o NIDDM with chronic kidney disease and thick, elongated toenails b/l lower extremities which are tender when wearing enclosed shoe gear.  Patient states blood glucose was 130 mg/dl today.  No Known Allergies  PCP is Dr. Iona Beard and last visit was January 14, 2021.  Review of Systems: Negative except as noted in the HPI.   Objective:   Constitutional Pt is a pleasant 87 y.o. African American female in NAD. AAO x 3.   Vascular Capillary fill time to digits <3 seconds b/l lower extremities. Palpable DP pulse(s) b/l lower extremities Palpable PT pulse(s) b/l lower extremities Pedal hair sparse. Lower extremity skin temperature gradient within normal limits. No pain with calf compression b/l. No edema noted b/l lower extremities.  Neurologic Protective sensation intact 5/5 intact bilaterally with 10g monofilament b/l.  Dermatologic Pedal skin with normal turgor, texture and tone b/l lower extremities No open wounds b/l lower extremities No interdigital macerations b/l lower extremities Toenails 1-5 b/l elongated, discolored, dystrophic, thickened, crumbly with subungual debris and tenderness to dorsal palpation.  Orthopedic: Normal muscle strength 5/5 to all lower extremity muscle groups bilaterally. No pain crepitus or joint limitation noted with ROM b/l. Hallux valgus with bunion deformity noted b/l lower extremities. Hammertoe(s) noted to the b/l lower extremities.   Radiographs: None Assessment:   1. Pain due to onychomycosis of toenails of both feet    Plan:  -Patient was evaluated and treated and all questions answered. -Examined patient. -Continue diabetic foot care principles. -Patient to continue soft, supportive shoe gear daily. -Toenails 1-5 b/l were debrided in length and girth with sterile nail nippers and dremel without iatrogenic  bleeding.  -Patient to report any pedal injuries to medical professional immediately. -Patient/POA to call should there be question/concern in the interim.  Return in about 3 months (around 06/25/2021).  Marzetta Board, DPM

## 2021-04-02 ENCOUNTER — Other Ambulatory Visit: Payer: Self-pay

## 2021-04-02 ENCOUNTER — Encounter (HOSPITAL_COMMUNITY)
Admission: RE | Admit: 2021-04-02 | Discharge: 2021-04-02 | Disposition: A | Discharge status: Home / Self Care | Payer: Medicare PPO | Source: Ambulatory Visit | Attending: Nephrology | Admitting: Nephrology

## 2021-04-02 DIAGNOSIS — N184 Chronic kidney disease, stage 4 (severe): Secondary | ICD-10-CM | POA: Diagnosis not present

## 2021-04-02 LAB — POCT HEMOGLOBIN-HEMACUE: Hemoglobin: 9.3 g/dL — ABNORMAL LOW (ref 12.0–15.0)

## 2021-04-02 MED ORDER — EPOETIN ALFA 10000 UNIT/ML IJ SOLN: 4000.0000 [IU] | Freq: Once | INTRAMUSCULAR | Status: DC

## 2021-04-02 MED ORDER — EPOETIN ALFA 4000 UNIT/ML IJ SOLN
4000.0000 [IU] | Freq: Once | INTRAMUSCULAR | Status: AC
  Administered 2021-04-02: 4000 [IU] via SUBCUTANEOUS

## 2021-04-02 MED ORDER — EPOETIN ALFA 4000 UNIT/ML IJ SOLN
4000.0000 [IU] | Freq: Once | INTRAMUSCULAR | Status: AC
  Administered 2021-04-02: 4000 [IU] via SUBCUTANEOUS
  Filled 2021-04-02: qty 1

## 2021-04-15 DIAGNOSIS — D631 Anemia in chronic kidney disease: Secondary | ICD-10-CM | POA: Diagnosis not present

## 2021-04-15 DIAGNOSIS — E1122 Type 2 diabetes mellitus with diabetic chronic kidney disease: Secondary | ICD-10-CM | POA: Diagnosis not present

## 2021-04-15 DIAGNOSIS — I129 Hypertensive chronic kidney disease with stage 1 through stage 4 chronic kidney disease, or unspecified chronic kidney disease: Secondary | ICD-10-CM | POA: Diagnosis not present

## 2021-04-15 DIAGNOSIS — N184 Chronic kidney disease, stage 4 (severe): Secondary | ICD-10-CM | POA: Diagnosis not present

## 2021-04-15 DIAGNOSIS — Z794 Long term (current) use of insulin: Secondary | ICD-10-CM | POA: Diagnosis not present

## 2021-04-15 DIAGNOSIS — M25561 Pain in right knee: Secondary | ICD-10-CM | POA: Diagnosis not present

## 2021-04-16 ENCOUNTER — Encounter (HOSPITAL_COMMUNITY): Payer: Self-pay

## 2021-04-16 ENCOUNTER — Other Ambulatory Visit: Payer: Self-pay

## 2021-04-16 ENCOUNTER — Encounter (HOSPITAL_COMMUNITY)
Admission: RE | Admit: 2021-04-16 | Discharge: 2021-04-16 | Disposition: A | Discharge status: Home / Self Care | Payer: Medicare PPO | Source: Ambulatory Visit | Attending: Nephrology | Admitting: Nephrology

## 2021-04-16 DIAGNOSIS — D631 Anemia in chronic kidney disease: Secondary | ICD-10-CM | POA: Insufficient documentation

## 2021-04-16 DIAGNOSIS — N184 Chronic kidney disease, stage 4 (severe): Secondary | ICD-10-CM | POA: Insufficient documentation

## 2021-04-16 LAB — RENAL FUNCTION PANEL
Albumin: 4.4 g/dL (ref 3.5–5.0)
Anion gap: 12 (ref 5–15)
BUN: 57 mg/dL — ABNORMAL HIGH (ref 8–23)
CO2: 26 mmol/L (ref 22–32)
Calcium: 9.7 mg/dL (ref 8.9–10.3)
Chloride: 101 mmol/L (ref 98–111)
Creatinine, Ser: 2.56 mg/dL — ABNORMAL HIGH (ref 0.44–1.00)
GFR, Estimated: 18 mL/min — ABNORMAL LOW (ref >60)
Glucose, Bld: 214 mg/dL — ABNORMAL HIGH (ref 70–99)
Phosphorus: 3.9 mg/dL (ref 2.5–4.6)
Potassium: 4 mmol/L (ref 3.5–5.1)
Sodium: 139 mmol/L (ref 135–145)

## 2021-04-16 LAB — LIPID PANEL
Cholesterol: 182 mg/dL (ref 0–200)
HDL: 36 mg/dL — ABNORMAL LOW (ref >40)
LDL Cholesterol: 99 mg/dL (ref 0–99)
Total CHOL/HDL Ratio: 5.1 RATIO
Triglycerides: 234 mg/dL — ABNORMAL HIGH (ref <150)
VLDL: 47 mg/dL — ABNORMAL HIGH (ref 0–40)

## 2021-04-16 LAB — CBC WITH DIFFERENTIAL/PLATELET
Abs Immature Granulocytes: 0.02 10*3/uL (ref 0.00–0.07)
Basophils Absolute: 0 10*3/uL (ref 0.0–0.1)
Basophils Relative: 1 %
Eosinophils Absolute: 0.2 10*3/uL (ref 0.0–0.5)
Eosinophils Relative: 5 %
HCT: 31.7 % — ABNORMAL LOW (ref 36.0–46.0)
Hemoglobin: 9.7 g/dL — ABNORMAL LOW (ref 12.0–15.0)
Immature Granulocytes: 1 %
Lymphocytes Relative: 24 %
Lymphs Abs: 1 10*3/uL (ref 0.7–4.0)
MCH: 30.8 pg (ref 26.0–34.0)
MCHC: 30.6 g/dL (ref 30.0–36.0)
MCV: 100.6 fL — ABNORMAL HIGH (ref 80.0–100.0)
Monocytes Absolute: 0.3 10*3/uL (ref 0.1–1.0)
Monocytes Relative: 8 %
Neutro Abs: 2.5 10*3/uL (ref 1.7–7.7)
Neutrophils Relative %: 61 %
Platelets: 213 10*3/uL (ref 150–400)
RBC: 3.15 MIL/uL — ABNORMAL LOW (ref 3.87–5.11)
RDW: 15 % (ref 11.5–15.5)
WBC: 4.1 10*3/uL (ref 4.0–10.5)
nRBC: 0 % (ref 0.0–0.2)

## 2021-04-16 LAB — POCT HEMOGLOBIN-HEMACUE: Hemoglobin: 9.1 g/dL — ABNORMAL LOW (ref 12.0–15.0)

## 2021-04-16 LAB — PROTEIN / CREATININE RATIO, URINE
Creatinine, Urine: 96.44 mg/dL
Protein Creatinine Ratio: 0.35 mg/mg{Cre} — ABNORMAL HIGH (ref 0.00–0.15)
Total Protein, Urine: 34 mg/dL

## 2021-04-16 LAB — IRON AND TIBC
Iron: 73 ug/dL (ref 28–170)
Saturation Ratios: 25 % (ref 10.4–31.8)
TIBC: 297 ug/dL (ref 250–450)
UIBC: 224 ug/dL

## 2021-04-16 LAB — HEMOGLOBIN A1C
Hgb A1c MFr Bld: 6.6 % — ABNORMAL HIGH (ref 4.8–5.6)
Mean Plasma Glucose: 142.72 mg/dL

## 2021-04-16 MED ORDER — EPOETIN ALFA 10000 UNIT/ML IJ SOLN
8000.0000 [IU] | INTRAMUSCULAR | Status: DC
  Administered 2021-04-16: 8000 [IU] via SUBCUTANEOUS
  Filled 2021-04-16: qty 1

## 2021-04-17 DIAGNOSIS — E211 Secondary hyperparathyroidism, not elsewhere classified: Secondary | ICD-10-CM | POA: Diagnosis not present

## 2021-04-17 DIAGNOSIS — D631 Anemia in chronic kidney disease: Secondary | ICD-10-CM | POA: Diagnosis not present

## 2021-04-17 DIAGNOSIS — E1129 Type 2 diabetes mellitus with other diabetic kidney complication: Secondary | ICD-10-CM | POA: Diagnosis not present

## 2021-04-17 DIAGNOSIS — R809 Proteinuria, unspecified: Secondary | ICD-10-CM | POA: Diagnosis not present

## 2021-04-17 DIAGNOSIS — E1122 Type 2 diabetes mellitus with diabetic chronic kidney disease: Secondary | ICD-10-CM | POA: Diagnosis not present

## 2021-04-17 DIAGNOSIS — N189 Chronic kidney disease, unspecified: Secondary | ICD-10-CM | POA: Diagnosis not present

## 2021-04-17 DIAGNOSIS — I129 Hypertensive chronic kidney disease with stage 1 through stage 4 chronic kidney disease, or unspecified chronic kidney disease: Secondary | ICD-10-CM | POA: Diagnosis not present

## 2021-04-17 DIAGNOSIS — R718 Other abnormality of red blood cells: Secondary | ICD-10-CM | POA: Diagnosis not present

## 2021-04-17 LAB — PTH, INTACT AND CALCIUM
Calcium, Total (PTH): 9.7 mg/dL (ref 8.7–10.3)
PTH: 75 pg/mL — ABNORMAL HIGH (ref 15–65)

## 2021-04-30 ENCOUNTER — Other Ambulatory Visit: Payer: Self-pay

## 2021-04-30 ENCOUNTER — Encounter (HOSPITAL_COMMUNITY): Payer: Self-pay

## 2021-04-30 ENCOUNTER — Encounter (HOSPITAL_COMMUNITY)
Admission: RE | Admit: 2021-04-30 | Discharge: 2021-04-30 | Disposition: A | Discharge status: Home / Self Care | Payer: Medicare PPO | Source: Ambulatory Visit | Attending: Nephrology | Admitting: Nephrology

## 2021-04-30 DIAGNOSIS — N184 Chronic kidney disease, stage 4 (severe): Secondary | ICD-10-CM | POA: Diagnosis not present

## 2021-04-30 LAB — POCT HEMOGLOBIN-HEMACUE: Hemoglobin: 9.6 g/dL — ABNORMAL LOW (ref 12.0–15.0)

## 2021-04-30 MED ORDER — EPOETIN ALFA 10000 UNIT/ML IJ SOLN
8000.0000 [IU] | Freq: Once | INTRAMUSCULAR | Status: AC
  Administered 2021-04-30: 8000 [IU] via SUBCUTANEOUS
  Filled 2021-04-30: qty 1

## 2021-05-14 ENCOUNTER — Other Ambulatory Visit: Payer: Self-pay

## 2021-05-14 ENCOUNTER — Encounter (HOSPITAL_COMMUNITY)
Admission: RE | Admit: 2021-05-14 | Discharge: 2021-05-14 | Disposition: A | Discharge status: Home / Self Care | Payer: Medicare PPO | Source: Ambulatory Visit | Attending: Nephrology | Admitting: Nephrology

## 2021-05-14 ENCOUNTER — Encounter (HOSPITAL_COMMUNITY): Payer: Self-pay

## 2021-05-14 DIAGNOSIS — N184 Chronic kidney disease, stage 4 (severe): Secondary | ICD-10-CM | POA: Insufficient documentation

## 2021-05-14 LAB — POCT HEMOGLOBIN-HEMACUE: Hemoglobin: 9.5 g/dL — ABNORMAL LOW (ref 12.0–15.0)

## 2021-05-14 MED ORDER — EPOETIN ALFA 10000 UNIT/ML IJ SOLN
8000.0000 [IU] | Freq: Once | INTRAMUSCULAR | Status: AC
  Administered 2021-05-14: 8000 [IU] via SUBCUTANEOUS
  Filled 2021-05-14: qty 1

## 2021-05-28 ENCOUNTER — Other Ambulatory Visit: Payer: Self-pay

## 2021-05-28 ENCOUNTER — Encounter (HOSPITAL_COMMUNITY)
Admission: RE | Admit: 2021-05-28 | Discharge: 2021-05-28 | Disposition: A | Discharge status: Home / Self Care | Payer: Medicare PPO | Source: Ambulatory Visit | Attending: Nephrology | Admitting: Nephrology

## 2021-05-28 DIAGNOSIS — N184 Chronic kidney disease, stage 4 (severe): Secondary | ICD-10-CM | POA: Diagnosis not present

## 2021-05-28 LAB — POCT HEMOGLOBIN-HEMACUE: Hemoglobin: 9.5 g/dL — ABNORMAL LOW (ref 12.0–15.0)

## 2021-05-28 MED ORDER — EPOETIN ALFA 4000 UNIT/ML IJ SOLN
INTRAMUSCULAR | Status: AC
  Administered 2021-05-28: 8000 [IU]
  Filled 2021-05-28: qty 2

## 2021-05-28 MED ORDER — EPOETIN ALFA 3000 UNIT/ML IJ SOLN: 8000.0000 [IU] | Freq: Once | INTRAMUSCULAR | Status: DC

## 2021-06-02 DIAGNOSIS — E1122 Type 2 diabetes mellitus with diabetic chronic kidney disease: Secondary | ICD-10-CM | POA: Diagnosis not present

## 2021-06-11 ENCOUNTER — Encounter (HOSPITAL_COMMUNITY): Payer: Self-pay

## 2021-06-11 ENCOUNTER — Other Ambulatory Visit: Payer: Self-pay

## 2021-06-11 ENCOUNTER — Encounter (HOSPITAL_COMMUNITY)
Admission: RE | Admit: 2021-06-11 | Discharge: 2021-06-11 | Disposition: A | Discharge status: Home / Self Care | Payer: Medicare PPO | Source: Ambulatory Visit | Attending: Nephrology | Admitting: Nephrology

## 2021-06-11 DIAGNOSIS — N184 Chronic kidney disease, stage 4 (severe): Secondary | ICD-10-CM | POA: Insufficient documentation

## 2021-06-11 LAB — CBC WITH DIFFERENTIAL/PLATELET
Abs Immature Granulocytes: 0.02 10*3/uL (ref 0.00–0.07)
Basophils Absolute: 0 10*3/uL (ref 0.0–0.1)
Basophils Relative: 1 %
Eosinophils Absolute: 0.1 10*3/uL (ref 0.0–0.5)
Eosinophils Relative: 3 %
HCT: 31.1 % — ABNORMAL LOW (ref 36.0–46.0)
Hemoglobin: 9.6 g/dL — ABNORMAL LOW (ref 12.0–15.0)
Immature Granulocytes: 1 %
Lymphocytes Relative: 24 %
Lymphs Abs: 0.9 10*3/uL (ref 0.7–4.0)
MCH: 31.1 pg (ref 26.0–34.0)
MCHC: 30.9 g/dL (ref 30.0–36.0)
MCV: 100.6 fL — ABNORMAL HIGH (ref 80.0–100.0)
Monocytes Absolute: 0.4 10*3/uL (ref 0.1–1.0)
Monocytes Relative: 10 %
Neutro Abs: 2.4 10*3/uL (ref 1.7–7.7)
Neutrophils Relative %: 61 %
Platelets: 206 10*3/uL (ref 150–400)
RBC: 3.09 MIL/uL — ABNORMAL LOW (ref 3.87–5.11)
RDW: 15.1 % (ref 11.5–15.5)
WBC: 3.9 10*3/uL — ABNORMAL LOW (ref 4.0–10.5)
nRBC: 0 % (ref 0.0–0.2)

## 2021-06-11 LAB — RENAL FUNCTION PANEL
Albumin: 4.4 g/dL (ref 3.5–5.0)
Anion gap: 12 (ref 5–15)
BUN: 55 mg/dL — ABNORMAL HIGH (ref 8–23)
CO2: 24 mmol/L (ref 22–32)
Calcium: 9.8 mg/dL (ref 8.9–10.3)
Chloride: 105 mmol/L (ref 98–111)
Creatinine, Ser: 2.61 mg/dL — ABNORMAL HIGH (ref 0.44–1.00)
GFR, Estimated: 17 mL/min — ABNORMAL LOW (ref >60)
Glucose, Bld: 232 mg/dL — ABNORMAL HIGH (ref 70–99)
Phosphorus: 3.5 mg/dL (ref 2.5–4.6)
Potassium: 4 mmol/L (ref 3.5–5.1)
Sodium: 141 mmol/L (ref 135–145)

## 2021-06-11 LAB — PROTEIN / CREATININE RATIO, URINE
Creatinine, Urine: 76.25 mg/dL
Protein Creatinine Ratio: 0.35 mg/mg{Cre} — ABNORMAL HIGH (ref 0.00–0.15)
Total Protein, Urine: 27 mg/dL

## 2021-06-11 LAB — FOLATE: Folate: 17.5 ng/mL (ref >5.9)

## 2021-06-11 LAB — POCT HEMOGLOBIN-HEMACUE: Hemoglobin: 9.6 g/dL — ABNORMAL LOW (ref 12.0–15.0)

## 2021-06-11 LAB — VITAMIN B12: Vitamin B-12: 221 pg/mL (ref 180–914)

## 2021-06-11 MED ORDER — EPOETIN ALFA 4000 UNIT/ML IJ SOLN: 8000.0000 [IU] | Freq: Once | INTRAMUSCULAR | Status: AC

## 2021-06-11 MED ORDER — EPOETIN ALFA 10000 UNIT/ML IJ SOLN
INTRAMUSCULAR | Status: AC
  Administered 2021-06-11: 8000 [IU]
  Filled 2021-06-11: qty 1

## 2021-06-17 DIAGNOSIS — H05232 Hemorrhage of left orbit: Secondary | ICD-10-CM | POA: Diagnosis not present

## 2021-06-25 ENCOUNTER — Other Ambulatory Visit: Payer: Self-pay

## 2021-06-25 ENCOUNTER — Encounter (HOSPITAL_COMMUNITY)
Admission: RE | Admit: 2021-06-25 | Discharge: 2021-06-25 | Disposition: A | Discharge status: Home / Self Care | Payer: Medicare PPO | Source: Ambulatory Visit | Attending: Nephrology | Admitting: Nephrology

## 2021-06-25 ENCOUNTER — Encounter (HOSPITAL_COMMUNITY): Payer: Self-pay

## 2021-06-25 DIAGNOSIS — N184 Chronic kidney disease, stage 4 (severe): Secondary | ICD-10-CM | POA: Diagnosis not present

## 2021-06-25 DIAGNOSIS — E1129 Type 2 diabetes mellitus with other diabetic kidney complication: Secondary | ICD-10-CM | POA: Diagnosis not present

## 2021-06-25 DIAGNOSIS — I129 Hypertensive chronic kidney disease with stage 1 through stage 4 chronic kidney disease, or unspecified chronic kidney disease: Secondary | ICD-10-CM | POA: Diagnosis not present

## 2021-06-25 DIAGNOSIS — R809 Proteinuria, unspecified: Secondary | ICD-10-CM | POA: Diagnosis not present

## 2021-06-25 DIAGNOSIS — E1122 Type 2 diabetes mellitus with diabetic chronic kidney disease: Secondary | ICD-10-CM | POA: Diagnosis not present

## 2021-06-25 DIAGNOSIS — D631 Anemia in chronic kidney disease: Secondary | ICD-10-CM | POA: Diagnosis not present

## 2021-06-25 DIAGNOSIS — E211 Secondary hyperparathyroidism, not elsewhere classified: Secondary | ICD-10-CM | POA: Diagnosis not present

## 2021-06-25 DIAGNOSIS — N189 Chronic kidney disease, unspecified: Secondary | ICD-10-CM | POA: Diagnosis not present

## 2021-06-25 LAB — POCT HEMOGLOBIN-HEMACUE: Hemoglobin: 9.6 g/dL — ABNORMAL LOW (ref 12.0–15.0)

## 2021-06-25 MED ORDER — EPOETIN ALFA 10000 UNIT/ML IJ SOLN
INTRAMUSCULAR | Status: AC
  Filled 2021-06-25: qty 1

## 2021-06-25 MED ORDER — EPOETIN ALFA 10000 UNIT/ML IJ SOLN
8000.0000 [IU] | Freq: Once | INTRAMUSCULAR | Status: AC
  Administered 2021-06-25: 8000 [IU] via SUBCUTANEOUS

## 2021-06-30 ENCOUNTER — Ambulatory Visit: Payer: Medicare PPO | Admitting: Podiatry

## 2021-06-30 ENCOUNTER — Encounter: Payer: Self-pay | Admitting: Podiatry

## 2021-06-30 ENCOUNTER — Other Ambulatory Visit: Payer: Self-pay

## 2021-06-30 DIAGNOSIS — Z794 Long term (current) use of insulin: Secondary | ICD-10-CM | POA: Diagnosis not present

## 2021-06-30 DIAGNOSIS — M79675 Pain in left toe(s): Secondary | ICD-10-CM

## 2021-06-30 DIAGNOSIS — M79674 Pain in right toe(s): Secondary | ICD-10-CM | POA: Diagnosis not present

## 2021-06-30 DIAGNOSIS — E1121 Type 2 diabetes mellitus with diabetic nephropathy: Secondary | ICD-10-CM

## 2021-06-30 DIAGNOSIS — B351 Tinea unguium: Secondary | ICD-10-CM | POA: Diagnosis not present

## 2021-07-04 NOTE — Progress Notes (Signed)
  Subjective:  Patient ID: Stephanie Middleton, female    DOB: Aug 31, 1931,  MRN: [id number]  87 y.o. female presents at risk foot care. Pt has h/o NIDDM with chronic kidney disease and thick, elongated toenails b/l lower extremities which are tender when wearing enclosed shoe gear.  Patient states blood glucose was 117 mg/dl today. She states her great toes are tender due to length. She denies any redness, drainage or swelling.  Her daughter is present during today's visit.  No Known Allergies  PCP is Dr. Iona Beard and last visit was July, 2022.  Review of Systems: Negative except as noted in the HPI.   Objective:   Constitutional Pt is a pleasant 87 y.o. African American female in NAD. AAO x 3.   Vascular Capillary fill time to digits <3 seconds b/l lower extremities. Palpable DP pulse(s) b/l lower extremities Palpable PT pulse(s) b/l lower extremities Pedal hair sparse. Lower extremity skin temperature gradient within normal limits. No pain with calf compression b/l. No edema noted b/l lower extremities.  Neurologic Protective sensation intact 5/5 intact bilaterally with 10g monofilament b/l.  Dermatologic Pedal skin with normal turgor, texture and tone b/l lower extremities No open wounds b/l lower extremities No interdigital macerations b/l lower extremities Toenails 1-5 b/l elongated, discolored, dystrophic, thickened, crumbly with subungual debris and tenderness to dorsal palpation.  Orthopedic: Normal muscle strength 5/5 to all lower extremity muscle groups bilaterally. No pain crepitus or joint limitation noted with ROM b/l. Hallux valgus with bunion deformity noted b/l lower extremities. Hammertoe(s) noted to the b/l lower extremities.   Radiographs: None Assessment:   1. Pain due to onychomycosis of toenails of both feet   2. Type 2 diabetes mellitus with diabetic nephropathy, with long-term current use of insulin ([place])     Plan:  -Patient was evaluated and treated and all  questions answered. -No new findings. No new orders. -Continue diabetic foot care principles: inspect feet daily, monitor glucose as recommended by PCP and/or Endocrinologist, and follow prescribed diet per PCP, Endocrinologist and/or dietician. -Patient to continue soft, supportive shoe gear daily. -Toenails 1-5 b/l were debrided in length and girth with sterile nail nippers and dremel without iatrogenic bleeding.  -Patient to report any pedal injuries to medical professional immediately. -Patient/POA to call should there be question/concern in the interim.  Return in about 3 months (around 09/29/2021).  Marzetta Board, DPM

## 2021-07-09 ENCOUNTER — Encounter (HOSPITAL_COMMUNITY)
Admission: RE | Admit: 2021-07-09 | Discharge: 2021-07-09 | Disposition: A | Discharge status: Home / Self Care | Payer: Medicare PPO | Source: Ambulatory Visit | Attending: Nephrology | Admitting: Nephrology

## 2021-07-09 DIAGNOSIS — N184 Chronic kidney disease, stage 4 (severe): Secondary | ICD-10-CM | POA: Insufficient documentation

## 2021-07-09 DIAGNOSIS — D631 Anemia in chronic kidney disease: Secondary | ICD-10-CM | POA: Diagnosis not present

## 2021-07-09 LAB — POCT HEMOGLOBIN-HEMACUE: Hemoglobin: 9.1 g/dL — ABNORMAL LOW (ref 12.0–15.0)

## 2021-07-09 MED ORDER — EPOETIN ALFA 4000 UNIT/ML IJ SOLN
INTRAMUSCULAR | Status: AC
  Administered 2021-07-09: 8000 [IU] via SUBCUTANEOUS
  Filled 2021-07-09: qty 2

## 2021-07-09 MED ORDER — EPOETIN ALFA 4000 UNIT/ML IJ SOLN: 8000.0000 [IU] | Freq: Once | INTRAMUSCULAR | Status: DC

## 2021-07-15 DIAGNOSIS — N184 Chronic kidney disease, stage 4 (severe): Secondary | ICD-10-CM | POA: Diagnosis not present

## 2021-07-15 DIAGNOSIS — M25561 Pain in right knee: Secondary | ICD-10-CM | POA: Diagnosis not present

## 2021-07-15 DIAGNOSIS — D631 Anemia in chronic kidney disease: Secondary | ICD-10-CM | POA: Diagnosis not present

## 2021-07-15 DIAGNOSIS — Z23 Encounter for immunization: Secondary | ICD-10-CM | POA: Diagnosis not present

## 2021-07-15 DIAGNOSIS — Z0000 Encounter for general adult medical examination without abnormal findings: Secondary | ICD-10-CM | POA: Diagnosis not present

## 2021-07-15 DIAGNOSIS — E1122 Type 2 diabetes mellitus with diabetic chronic kidney disease: Secondary | ICD-10-CM | POA: Diagnosis not present

## 2021-07-15 DIAGNOSIS — I129 Hypertensive chronic kidney disease with stage 1 through stage 4 chronic kidney disease, or unspecified chronic kidney disease: Secondary | ICD-10-CM | POA: Diagnosis not present

## 2021-07-15 DIAGNOSIS — R8279 Other abnormal findings on microbiological examination of urine: Secondary | ICD-10-CM | POA: Diagnosis not present

## 2021-07-15 DIAGNOSIS — M159 Polyosteoarthritis, unspecified: Secondary | ICD-10-CM | POA: Diagnosis not present

## 2021-07-15 DIAGNOSIS — Z794 Long term (current) use of insulin: Secondary | ICD-10-CM | POA: Diagnosis not present

## 2021-07-15 DIAGNOSIS — M25551 Pain in right hip: Secondary | ICD-10-CM | POA: Diagnosis not present

## 2021-07-23 ENCOUNTER — Encounter (HOSPITAL_COMMUNITY)
Admission: RE | Admit: 2021-07-23 | Discharge: 2021-07-23 | Disposition: A | Discharge status: Home / Self Care | Payer: Medicare PPO | Source: Ambulatory Visit | Attending: Nephrology | Admitting: Nephrology

## 2021-07-23 DIAGNOSIS — N184 Chronic kidney disease, stage 4 (severe): Secondary | ICD-10-CM | POA: Diagnosis not present

## 2021-07-23 LAB — POCT HEMOGLOBIN-HEMACUE: Hemoglobin: 9.3 g/dL — ABNORMAL LOW (ref 12.0–15.0)

## 2021-07-23 MED ORDER — EPOETIN ALFA 4000 UNIT/ML IJ SOLN
8000.0000 [IU] | Freq: Once | INTRAMUSCULAR | Status: AC
  Administered 2021-07-23: 8000 [IU] via SUBCUTANEOUS

## 2021-07-23 MED ORDER — EPOETIN ALFA 4000 UNIT/ML IJ SOLN
INTRAMUSCULAR | Status: AC
  Filled 2021-07-23: qty 2

## 2021-08-06 ENCOUNTER — Encounter (HOSPITAL_COMMUNITY)
Admission: RE | Admit: 2021-08-06 | Discharge: 2021-08-06 | Disposition: A | Discharge status: Home / Self Care | Payer: Medicare PPO | Source: Ambulatory Visit | Attending: Nephrology | Admitting: Nephrology

## 2021-08-06 DIAGNOSIS — N184 Chronic kidney disease, stage 4 (severe): Secondary | ICD-10-CM | POA: Diagnosis not present

## 2021-08-06 LAB — CBC WITH DIFFERENTIAL/PLATELET
Abs Immature Granulocytes: 0.03 10*3/uL (ref 0.00–0.07)
Basophils Absolute: 0 10*3/uL (ref 0.0–0.1)
Basophils Relative: 1 %
Eosinophils Absolute: 0.1 10*3/uL (ref 0.0–0.5)
Eosinophils Relative: 3 %
HCT: 29.3 % — ABNORMAL LOW (ref 36.0–46.0)
Hemoglobin: 9.2 g/dL — ABNORMAL LOW (ref 12.0–15.0)
Immature Granulocytes: 1 %
Lymphocytes Relative: 23 %
Lymphs Abs: 1 10*3/uL (ref 0.7–4.0)
MCH: 31.6 pg (ref 26.0–34.0)
MCHC: 31.4 g/dL (ref 30.0–36.0)
MCV: 100.7 fL — ABNORMAL HIGH (ref 80.0–100.0)
Monocytes Absolute: 0.3 10*3/uL (ref 0.1–1.0)
Monocytes Relative: 8 %
Neutro Abs: 2.7 10*3/uL (ref 1.7–7.7)
Neutrophils Relative %: 64 %
Platelets: 199 10*3/uL (ref 150–400)
RBC: 2.91 MIL/uL — ABNORMAL LOW (ref 3.87–5.11)
RDW: 15.2 % (ref 11.5–15.5)
WBC: 4.2 10*3/uL (ref 4.0–10.5)
nRBC: 0 % (ref 0.0–0.2)

## 2021-08-06 LAB — RENAL FUNCTION PANEL
Albumin: 4.3 g/dL (ref 3.5–5.0)
Anion gap: 9 (ref 5–15)
BUN: 59 mg/dL — ABNORMAL HIGH (ref 8–23)
CO2: 26 mmol/L (ref 22–32)
Calcium: 9.3 mg/dL (ref 8.9–10.3)
Chloride: 105 mmol/L (ref 98–111)
Creatinine, Ser: 2.48 mg/dL — ABNORMAL HIGH (ref 0.44–1.00)
GFR, Estimated: 18 mL/min — ABNORMAL LOW (ref >60)
Glucose, Bld: 186 mg/dL — ABNORMAL HIGH (ref 70–99)
Phosphorus: 3.8 mg/dL (ref 2.5–4.6)
Potassium: 4.2 mmol/L (ref 3.5–5.1)
Sodium: 140 mmol/L (ref 135–145)

## 2021-08-06 LAB — POCT HEMOGLOBIN-HEMACUE: Hemoglobin: 9 g/dL — ABNORMAL LOW (ref 12.0–15.0)

## 2021-08-06 LAB — PROTEIN / CREATININE RATIO, URINE
Creatinine, Urine: 90.41 mg/dL
Protein Creatinine Ratio: 0.54 mg/mg{Cre} — ABNORMAL HIGH (ref 0.00–0.15)
Total Protein, Urine: 49 mg/dL

## 2021-08-06 MED ORDER — EPOETIN ALFA 4000 UNIT/ML IJ SOLN
INTRAMUSCULAR | Status: AC
  Administered 2021-08-06: 8000 [IU] via SUBCUTANEOUS
  Filled 2021-08-06: qty 2

## 2021-08-06 MED ORDER — EPOETIN ALFA 4000 UNIT/ML IJ SOLN: 4000.0000 [IU] | Freq: Once | INTRAMUSCULAR | Status: DC

## 2021-08-20 ENCOUNTER — Encounter (HOSPITAL_COMMUNITY)
Admission: RE | Admit: 2021-08-20 | Discharge: 2021-08-20 | Disposition: A | Discharge status: Home / Self Care | Payer: Medicare PPO | Source: Ambulatory Visit | Attending: Nephrology | Admitting: Nephrology

## 2021-08-20 DIAGNOSIS — D631 Anemia in chronic kidney disease: Secondary | ICD-10-CM | POA: Diagnosis not present

## 2021-08-20 DIAGNOSIS — N184 Chronic kidney disease, stage 4 (severe): Secondary | ICD-10-CM | POA: Insufficient documentation

## 2021-08-20 LAB — POCT HEMOGLOBIN-HEMACUE: Hemoglobin: 9.8 g/dL — ABNORMAL LOW (ref 12.0–15.0)

## 2021-08-20 MED ORDER — EPOETIN ALFA 4000 UNIT/ML IJ SOLN
8000.0000 [IU] | Freq: Once | INTRAMUSCULAR | Status: AC
  Administered 2021-08-20: 8000 [IU] via SUBCUTANEOUS

## 2021-08-20 MED ORDER — EPOETIN ALFA 10000 UNIT/ML IJ SOLN
8000.0000 [IU] | Freq: Once | INTRAMUSCULAR | Status: DC
Start: 2021-08-20 — End: 2021-08-20

## 2021-08-20 MED ORDER — EPOETIN ALFA 4000 UNIT/ML IJ SOLN
INTRAMUSCULAR | Status: AC
  Filled 2021-08-20: qty 2

## 2021-08-21 DIAGNOSIS — R809 Proteinuria, unspecified: Secondary | ICD-10-CM | POA: Diagnosis not present

## 2021-08-21 DIAGNOSIS — I129 Hypertensive chronic kidney disease with stage 1 through stage 4 chronic kidney disease, or unspecified chronic kidney disease: Secondary | ICD-10-CM | POA: Diagnosis not present

## 2021-08-21 DIAGNOSIS — E1122 Type 2 diabetes mellitus with diabetic chronic kidney disease: Secondary | ICD-10-CM | POA: Diagnosis not present

## 2021-08-21 DIAGNOSIS — E211 Secondary hyperparathyroidism, not elsewhere classified: Secondary | ICD-10-CM | POA: Diagnosis not present

## 2021-08-21 DIAGNOSIS — D631 Anemia in chronic kidney disease: Secondary | ICD-10-CM | POA: Diagnosis not present

## 2021-08-21 DIAGNOSIS — N189 Chronic kidney disease, unspecified: Secondary | ICD-10-CM | POA: Diagnosis not present

## 2021-08-21 DIAGNOSIS — E1129 Type 2 diabetes mellitus with other diabetic kidney complication: Secondary | ICD-10-CM | POA: Diagnosis not present

## 2021-09-03 ENCOUNTER — Other Ambulatory Visit: Payer: Self-pay

## 2021-09-03 ENCOUNTER — Encounter (HOSPITAL_COMMUNITY)
Admission: RE | Admit: 2021-09-03 | Discharge: 2021-09-03 | Disposition: A | Discharge status: Home / Self Care | Payer: Medicare PPO | Source: Ambulatory Visit | Attending: Nephrology | Admitting: Nephrology

## 2021-09-03 ENCOUNTER — Encounter (HOSPITAL_COMMUNITY): Payer: Self-pay

## 2021-09-03 DIAGNOSIS — E1122 Type 2 diabetes mellitus with diabetic chronic kidney disease: Secondary | ICD-10-CM | POA: Diagnosis not present

## 2021-09-03 DIAGNOSIS — N184 Chronic kidney disease, stage 4 (severe): Secondary | ICD-10-CM | POA: Diagnosis not present

## 2021-09-03 LAB — POCT HEMOGLOBIN-HEMACUE: Hemoglobin: 9.6 g/dL — ABNORMAL LOW (ref 12.0–15.0)

## 2021-09-03 MED ORDER — EPOETIN ALFA 10000 UNIT/ML IJ SOLN
8000.0000 [IU] | Freq: Once | INTRAMUSCULAR | Status: AC
  Administered 2021-09-03: 8000 [IU] via SUBCUTANEOUS
  Filled 2021-09-03: qty 1

## 2021-09-17 ENCOUNTER — Other Ambulatory Visit: Payer: Self-pay

## 2021-09-17 ENCOUNTER — Encounter (HOSPITAL_COMMUNITY)
Admission: RE | Admit: 2021-09-17 | Discharge: 2021-09-17 | Disposition: A | Discharge status: Home / Self Care | Payer: Medicare PPO | Source: Ambulatory Visit | Attending: Nephrology | Admitting: Nephrology

## 2021-09-17 DIAGNOSIS — D631 Anemia in chronic kidney disease: Secondary | ICD-10-CM | POA: Diagnosis not present

## 2021-09-17 DIAGNOSIS — N184 Chronic kidney disease, stage 4 (severe): Secondary | ICD-10-CM | POA: Diagnosis present

## 2021-09-17 LAB — POCT HEMOGLOBIN-HEMACUE: Hemoglobin: 9 g/dL — ABNORMAL LOW (ref 12.0–15.0)

## 2021-09-17 MED ORDER — EPOETIN ALFA 4000 UNIT/ML IJ SOLN
8000.0000 [IU] | Freq: Once | INTRAMUSCULAR | Status: AC
  Administered 2021-09-17: 10:00:00 8000 [IU] via SUBCUTANEOUS

## 2021-09-17 MED ORDER — EPOETIN ALFA 4000 UNIT/ML IJ SOLN
INTRAMUSCULAR | Status: AC
  Filled 2021-09-17: qty 2

## 2021-10-01 ENCOUNTER — Encounter (HOSPITAL_COMMUNITY)
Admission: RE | Admit: 2021-10-01 | Discharge: 2021-10-01 | Disposition: A | Discharge status: Home / Self Care | Payer: Medicare PPO | Source: Ambulatory Visit | Attending: Nephrology | Admitting: Nephrology

## 2021-10-01 DIAGNOSIS — N184 Chronic kidney disease, stage 4 (severe): Secondary | ICD-10-CM | POA: Diagnosis not present

## 2021-10-01 LAB — POCT HEMOGLOBIN-HEMACUE: Hemoglobin: 9.8 g/dL — ABNORMAL LOW (ref 12.0–15.0)

## 2021-10-01 MED ORDER — EPOETIN ALFA 10000 UNIT/ML IJ SOLN
8000.0000 [IU] | Freq: Once | INTRAMUSCULAR | Status: AC
  Administered 2021-10-01: 10:00:00 8000 [IU] via SUBCUTANEOUS
  Filled 2021-10-01: qty 1

## 2021-10-13 ENCOUNTER — Other Ambulatory Visit: Payer: Self-pay

## 2021-10-13 ENCOUNTER — Ambulatory Visit: Payer: Medicare PPO | Admitting: Podiatry

## 2021-10-13 ENCOUNTER — Encounter: Payer: Self-pay | Admitting: Podiatry

## 2021-10-13 DIAGNOSIS — M2011 Hallux valgus (acquired), right foot: Secondary | ICD-10-CM | POA: Diagnosis not present

## 2021-10-13 DIAGNOSIS — L600 Ingrowing nail: Secondary | ICD-10-CM | POA: Diagnosis not present

## 2021-10-13 DIAGNOSIS — M2042 Other hammer toe(s) (acquired), left foot: Secondary | ICD-10-CM

## 2021-10-13 DIAGNOSIS — Z794 Long term (current) use of insulin: Secondary | ICD-10-CM

## 2021-10-13 DIAGNOSIS — M79675 Pain in left toe(s): Secondary | ICD-10-CM | POA: Diagnosis not present

## 2021-10-13 DIAGNOSIS — E119 Type 2 diabetes mellitus without complications: Secondary | ICD-10-CM | POA: Diagnosis not present

## 2021-10-13 DIAGNOSIS — M2041 Other hammer toe(s) (acquired), right foot: Secondary | ICD-10-CM

## 2021-10-13 DIAGNOSIS — B351 Tinea unguium: Secondary | ICD-10-CM

## 2021-10-13 DIAGNOSIS — E1121 Type 2 diabetes mellitus with diabetic nephropathy: Secondary | ICD-10-CM

## 2021-10-13 DIAGNOSIS — M2012 Hallux valgus (acquired), left foot: Secondary | ICD-10-CM

## 2021-10-13 DIAGNOSIS — M79674 Pain in right toe(s): Secondary | ICD-10-CM

## 2021-10-15 ENCOUNTER — Encounter (HOSPITAL_COMMUNITY): Payer: Self-pay

## 2021-10-15 ENCOUNTER — Encounter (HOSPITAL_COMMUNITY)
Admission: RE | Admit: 2021-10-15 | Discharge: 2021-10-15 | Disposition: A | Discharge status: Home / Self Care | Payer: Medicare PPO | Source: Ambulatory Visit | Attending: Nephrology | Admitting: Nephrology

## 2021-10-15 DIAGNOSIS — D631 Anemia in chronic kidney disease: Secondary | ICD-10-CM | POA: Diagnosis not present

## 2021-10-15 DIAGNOSIS — E1165 Type 2 diabetes mellitus with hyperglycemia: Secondary | ICD-10-CM | POA: Insufficient documentation

## 2021-10-15 DIAGNOSIS — N184 Chronic kidney disease, stage 4 (severe): Secondary | ICD-10-CM | POA: Diagnosis not present

## 2021-10-15 DIAGNOSIS — Z794 Long term (current) use of insulin: Secondary | ICD-10-CM | POA: Insufficient documentation

## 2021-10-15 LAB — PROTEIN / CREATININE RATIO, URINE
Creatinine, Urine: 89.31 mg/dL
Protein Creatinine Ratio: 0.65 mg/mg{Cre} — ABNORMAL HIGH (ref 0.00–0.15)
Total Protein, Urine: 58 mg/dL

## 2021-10-15 LAB — CBC WITH DIFFERENTIAL/PLATELET
Abs Immature Granulocytes: 0.02 10*3/uL (ref 0.00–0.07)
Basophils Absolute: 0 10*3/uL (ref 0.0–0.1)
Basophils Relative: 1 %
Eosinophils Absolute: 0.1 10*3/uL (ref 0.0–0.5)
Eosinophils Relative: 3 %
HCT: 31.2 % — ABNORMAL LOW (ref 36.0–46.0)
Hemoglobin: 9.6 g/dL — ABNORMAL LOW (ref 12.0–15.0)
Immature Granulocytes: 1 %
Lymphocytes Relative: 21 %
Lymphs Abs: 0.9 10*3/uL (ref 0.7–4.0)
MCH: 30.7 pg (ref 26.0–34.0)
MCHC: 30.8 g/dL (ref 30.0–36.0)
MCV: 99.7 fL (ref 80.0–100.0)
Monocytes Absolute: 0.3 10*3/uL (ref 0.1–1.0)
Monocytes Relative: 6 %
Neutro Abs: 2.9 10*3/uL (ref 1.7–7.7)
Neutrophils Relative %: 68 %
Platelets: 189 10*3/uL (ref 150–400)
RBC: 3.13 MIL/uL — ABNORMAL LOW (ref 3.87–5.11)
RDW: 15.2 % (ref 11.5–15.5)
WBC: 4.3 10*3/uL (ref 4.0–10.5)
nRBC: 0 % (ref 0.0–0.2)

## 2021-10-15 LAB — POCT HEMOGLOBIN-HEMACUE: Hemoglobin: 9.8 g/dL — ABNORMAL LOW (ref 12.0–15.0)

## 2021-10-15 LAB — RENAL FUNCTION PANEL
Albumin: 4.3 g/dL (ref 3.5–5.0)
Anion gap: 10 (ref 5–15)
BUN: 45 mg/dL — ABNORMAL HIGH (ref 8–23)
CO2: 26 mmol/L (ref 22–32)
Calcium: 9.7 mg/dL (ref 8.9–10.3)
Chloride: 106 mmol/L (ref 98–111)
Creatinine, Ser: 2.39 mg/dL — ABNORMAL HIGH (ref 0.44–1.00)
GFR, Estimated: 19 mL/min — ABNORMAL LOW (ref >60)
Glucose, Bld: 178 mg/dL — ABNORMAL HIGH (ref 70–99)
Phosphorus: 3.8 mg/dL (ref 2.5–4.6)
Potassium: 4.4 mmol/L (ref 3.5–5.1)
Sodium: 142 mmol/L (ref 135–145)

## 2021-10-15 LAB — VITAMIN D 25 HYDROXY (VIT D DEFICIENCY, FRACTURES): Vit D, 25-Hydroxy: 81.92 ng/mL (ref 30–100)

## 2021-10-15 LAB — IRON AND TIBC
Iron: 74 ug/dL (ref 28–170)
Saturation Ratios: 26 % (ref 10.4–31.8)
TIBC: 285 ug/dL (ref 250–450)
UIBC: 211 ug/dL

## 2021-10-15 LAB — FERRITIN: Ferritin: 673 ng/mL — ABNORMAL HIGH (ref 11–307)

## 2021-10-15 MED ORDER — EPOETIN ALFA 4000 UNIT/ML IJ SOLN
4000.0000 [IU] | Freq: Once | INTRAMUSCULAR | Status: AC
  Administered 2021-10-15: 4000 [IU] via SUBCUTANEOUS
  Filled 2021-10-15: qty 1

## 2021-10-16 LAB — PTH, INTACT AND CALCIUM
Calcium, Total (PTH): 9.7 mg/dL (ref 8.7–10.3)
PTH: 101 pg/mL — ABNORMAL HIGH (ref 15–65)

## 2021-10-18 NOTE — Progress Notes (Signed)
ANNUAL DIABETIC FOOT EXAM  Subjective: Stephanie Middleton presents today for for annual diabetic foot examination and painful elongated mycotic toenails 1-5 bilaterally which are tender when wearing enclosed shoe gear. Pain is relieved with periodic professional debridement.  Patient relates 30 year h/o diabetes.  Patient denies any h/o foot wounds.  Patient denies any numbness, tingling, burning, or pins/needle sensation in feet.  Patient's blood sugar was 150 mg/dl today.   She relates her left 4th toe and both great toes are sore today. Denies any redness, drainage or swelling.  Stephanie Beard, MD is patient's PCP. Last visit was 07/16/2021.  Past Medical History:  Diagnosis Date   Anemia    Chronic kidney disease    Chronic pain    Back, right hip and right knee   Diabetes mellitus without complication ([place])    Hypercholesteremia    Hypertension    Patient Active Problem List   Diagnosis Date Noted   Anemia in chronic kidney disease 07/25/2010   Chronic kidney disease 07/25/2010   Essential (primary) hypertension 07/25/2010   Low back pain 07/25/2010   Pure hypercholesterolemia 07/25/2010   Type 2 diabetes mellitus with diabetic nephropathy ([place]) 07/25/2010   Type 2 diabetes mellitus with hyperglycemia ([place]) 07/25/2010   Past Surgical History:  Procedure Laterality Date   CHOLECYSTECTOMY     CYST EXCISION     Current Outpatient Medications on File Prior to Visit  Medication Sig Dispense Refill   ACCU-CHEK GUIDE test strip      Accu-Chek Softclix Lancets lancets      acetaminophen (TYLENOL) 500 MG tablet Take 500 mg by mouth 2 (two) times a day.     allopurinol (ZYLOPRIM) 100 MG tablet Take 100 mg by mouth daily.     amLODipine (NORVASC) 5 MG tablet Take 5 mg by mouth daily.     amLODipine-valsartan (EXFORGE) 10-160 MG tablet Take 1 tablet by mouth daily. exforge HCT 5-160 mg/12.5 mg     aspirin EC 81 MG tablet Take 81 mg by mouth daily.     calcitRIOL (ROCALTROL)  0.25 MCG capsule Take 0.25 mcg by mouth daily.     Cholecalciferol 125 MCG (5000 UT) capsule Take 5,000 Units by mouth daily.     cholestyramine light (PREVALITE) 4 g packet Take 4 g by mouth 2 (two) times daily.     colchicine 0.6 MG tablet Take 0.6 mg by mouth daily as needed.     epoetin alfa (EPOGEN) 4000 UNIT/ML injection Inject 8,000 Units into the skin every 14 (fourteen) days.      furosemide (LASIX) 20 MG tablet Take by mouth.     glipiZIDE (GLUCOTROL) 5 MG tablet Take by mouth 2 (two) times daily before a meal. Take 2 tablets twice daily     guanFACINE (TENEX) 2 MG tablet Take 2 mg by mouth at bedtime.     hydrochlorothiazide (HYDRODIURIL) 12.5 MG tablet      HYDROcodone-acetaminophen (NORCO/VICODIN) 5-325 MG tablet Take 1 tablet by mouth every 12 (twelve) hours as needed for moderate pain (take 1/2 tablet every 12 hours as needed).     iron polysaccharides (NIFEREX) 150 MG capsule Take 150 mg by mouth 2 (two) times daily.     labetalol (NORMODYNE) 200 MG tablet Take 200 mg by mouth 2 (two) times daily. Take 2 tablets twice daily     LEVEMIR FLEXTOUCH 100 UNIT/ML FlexPen      linagliptin (TRADJENTA) 5 MG TABS tablet Take 5 mg by mouth daily.  pravastatin (PRAVACHOL) 40 MG tablet Take 40 mg by mouth at bedtime.     predniSONE (STERAPRED UNI-PAK 21 TAB) 10 MG (21) TBPK tablet Take 10 mg by mouth daily. Finished dose pack on Monday 05/15/2019.     RABEprazole (ACIPHEX) 20 MG tablet Take 20 mg by mouth daily as needed. 1 tablet by mouth everyday as needed for indigestion     valsartan (DIOVAN) 160 MG tablet Take 160 mg by mouth daily.     No current facility-administered medications on file prior to visit.    No Known Allergies Social History   Occupational History   Not on file  Tobacco Use   Smoking status: Never   Smokeless tobacco: Never  Vaping Use   Vaping Use: Never used  Substance and Sexual Activity   Alcohol use: Never   Drug use: Never   Sexual activity: Not on  file   History reviewed. No pertinent family history. Immunization History  Administered Date(s) Administered   Influenza-Unspecified 07/05/2014     Review of Systems: Negative except as noted in the HPI.   Objective: There were no vitals filed for this visit.  Stephanie Middleton is a pleasant 88 y.o. female in NAD. AAO X 3.  Vascular Examination: CFT <3 seconds b/l LE. Palpable DP/PT pulses b/l LE. Digital hair sparse b/l. Skin temperature gradient WNL b/l. No pain with calf compression b/l. Trace edema noted b/l. No cyanosis or clubbing noted b/l LE.  Dermatological Examination: Pedal integument with normal turgor, texture and tone b/l LE. No open wounds b/l. No interdigital macerations b/l. Toenails 1-5 b/l elongated, thickened, discolored with subungual debris. +Tenderness with dorsal palpation of nailplates. No hyperkeratotic or porokeratotic lesions present. Toenails 1-5 b/l elongated, discolored, dystrophic, thickened, crumbly with subungual debris and tenderness to dorsal palpation. Incurvated nailplate bilateral border(s) bilateral great toes and L 4th toe.  Nail border hypertrophy minimal. There is tenderness to palpation. Sign(s) of infection: no clinical signs of infection noted on examination today..  Musculoskeletal Examination: Normal muscle strength 5/5 to all lower extremity muscle groups bilaterally. HAV with bunion deformity noted b/l LE. Hammertoe deformity noted 2-5 b/l.Marland Kitchen No pain, crepitus or joint limitation noted with ROM b/l LE.  Patient ambulates independently without assistive aids.  Footwear Assessment: Does the patient wear appropriate shoes? Yes. Does the patient need inserts/orthotics? No.  Neurological Examination: Protective sensation intact 5/5 intact bilaterally with 10g monofilament b/l.  Hemoglobin A1C Latest Ref Rng & Units 04/16/2021  HGBA1C 4.8 - 5.6 % 6.6(H)  Some recent data might be hidden   Assessment: 1. Pain due to onychomycosis of  toenails of both feet   2. Ingrown toenail without infection   3. Type 2 diabetes mellitus with diabetic nephropathy, with long-term current use of insulin ([place])   4. Hammertoes of both feet   5. Hallux valgus, acquired, bilateral   6. Encounter for diabetic foot exam ([place])      ADA Risk Categorization: Low Risk :  Patient has all of the following: Intact protective sensation No prior foot ulcer  No severe deformity Pedal pulses present  Plan: -Diabetic foot examination performed today. -Continue foot and shoe inspections daily. Monitor blood glucose per PCP/Endocrinologist's recommendations. -Mycotic toenails 1-5 bilaterally were debrided in length and girth with sterile nail nippers and dremel without incident. -Offending nail border debrided and curretaged bilateral great toes and L 4th toe utilizing sterile nail nipper and currette. Border(s) cleansed with alcohol and triple antibiotic ointment applied. Patient instructed to apply  Neosporin Cream  to bilateral great toes and L 4th toe once daily for 7 days. -Patient/POA to call should there be question/concern in the interim.  Return in about 3 months (around 01/11/2022).  Marzetta Board, DPM

## 2021-10-21 DIAGNOSIS — D631 Anemia in chronic kidney disease: Secondary | ICD-10-CM | POA: Diagnosis not present

## 2021-10-21 DIAGNOSIS — I10 Essential (primary) hypertension: Secondary | ICD-10-CM | POA: Diagnosis not present

## 2021-10-21 DIAGNOSIS — E1122 Type 2 diabetes mellitus with diabetic chronic kidney disease: Secondary | ICD-10-CM | POA: Diagnosis not present

## 2021-10-21 DIAGNOSIS — N184 Chronic kidney disease, stage 4 (severe): Secondary | ICD-10-CM | POA: Diagnosis not present

## 2021-10-21 DIAGNOSIS — M150 Primary generalized (osteo)arthritis: Secondary | ICD-10-CM | POA: Diagnosis not present

## 2021-10-23 DIAGNOSIS — E211 Secondary hyperparathyroidism, not elsewhere classified: Secondary | ICD-10-CM | POA: Diagnosis not present

## 2021-10-23 DIAGNOSIS — I129 Hypertensive chronic kidney disease with stage 1 through stage 4 chronic kidney disease, or unspecified chronic kidney disease: Secondary | ICD-10-CM | POA: Diagnosis not present

## 2021-10-23 DIAGNOSIS — R809 Proteinuria, unspecified: Secondary | ICD-10-CM | POA: Diagnosis not present

## 2021-10-23 DIAGNOSIS — E1122 Type 2 diabetes mellitus with diabetic chronic kidney disease: Secondary | ICD-10-CM | POA: Diagnosis not present

## 2021-10-23 DIAGNOSIS — N189 Chronic kidney disease, unspecified: Secondary | ICD-10-CM | POA: Diagnosis not present

## 2021-10-23 DIAGNOSIS — D631 Anemia in chronic kidney disease: Secondary | ICD-10-CM | POA: Diagnosis not present

## 2021-10-23 DIAGNOSIS — E1129 Type 2 diabetes mellitus with other diabetic kidney complication: Secondary | ICD-10-CM | POA: Diagnosis not present

## 2021-10-29 ENCOUNTER — Encounter (HOSPITAL_COMMUNITY): Payer: Self-pay

## 2021-10-29 ENCOUNTER — Encounter (HOSPITAL_COMMUNITY)
Admission: RE | Admit: 2021-10-29 | Discharge: 2021-10-29 | Disposition: A | Discharge status: Home / Self Care | Payer: Medicare PPO | Source: Ambulatory Visit | Attending: Nephrology | Admitting: Nephrology

## 2021-10-29 VITALS — BP 144/56 | HR 68 | Temp 98.6°F | Resp 18 | Ht 64.0 in | Wt 179.0 lb

## 2021-10-29 DIAGNOSIS — N184 Chronic kidney disease, stage 4 (severe): Secondary | ICD-10-CM | POA: Diagnosis not present

## 2021-10-29 DIAGNOSIS — Z794 Long term (current) use of insulin: Secondary | ICD-10-CM | POA: Diagnosis not present

## 2021-10-29 DIAGNOSIS — D631 Anemia in chronic kidney disease: Secondary | ICD-10-CM | POA: Diagnosis not present

## 2021-10-29 DIAGNOSIS — E1165 Type 2 diabetes mellitus with hyperglycemia: Secondary | ICD-10-CM | POA: Diagnosis not present

## 2021-10-29 LAB — LIPID PANEL
Cholesterol: 163 mg/dL (ref 0–200)
HDL: 35 mg/dL — ABNORMAL LOW (ref >40)
LDL Cholesterol: 96 mg/dL (ref 0–99)
Total CHOL/HDL Ratio: 4.7 RATIO
Triglycerides: 160 mg/dL — ABNORMAL HIGH (ref <150)
VLDL: 32 mg/dL (ref 0–40)

## 2021-10-29 LAB — POCT HEMOGLOBIN-HEMACUE: Hemoglobin: 9.5 g/dL — ABNORMAL LOW (ref 12.0–15.0)

## 2021-10-29 MED ORDER — EPOETIN ALFA 4000 UNIT/ML IJ SOLN
INTRAMUSCULAR | Status: AC
  Filled 2021-10-29: qty 2

## 2021-10-29 MED ORDER — EPOETIN ALFA 4000 UNIT/ML IJ SOLN
4000.0000 [IU] | Freq: Once | INTRAMUSCULAR | Status: AC
  Administered 2021-10-29: 10:00:00 4000 [IU] via SUBCUTANEOUS

## 2021-10-30 LAB — HEMOGLOBIN A1C
Hgb A1c MFr Bld: 6.5 % — ABNORMAL HIGH (ref 4.8–5.6)
Mean Plasma Glucose: 140 mg/dL

## 2021-11-12 ENCOUNTER — Encounter (HOSPITAL_COMMUNITY)
Admission: RE | Admit: 2021-11-12 | Discharge: 2021-11-12 | Disposition: A | Discharge status: Home / Self Care | Payer: Medicare PPO | Source: Ambulatory Visit | Attending: Nephrology | Admitting: Nephrology

## 2021-11-12 ENCOUNTER — Encounter (HOSPITAL_COMMUNITY): Payer: Self-pay

## 2021-11-12 DIAGNOSIS — D631 Anemia in chronic kidney disease: Secondary | ICD-10-CM | POA: Insufficient documentation

## 2021-11-12 DIAGNOSIS — N184 Chronic kidney disease, stage 4 (severe): Secondary | ICD-10-CM | POA: Insufficient documentation

## 2021-11-12 LAB — POCT HEMOGLOBIN-HEMACUE: Hemoglobin: 9.4 g/dL — ABNORMAL LOW (ref 12.0–15.0)

## 2021-11-12 MED ORDER — EPOETIN ALFA 4000 UNIT/ML IJ SOLN
INTRAMUSCULAR | Status: AC
  Filled 2021-11-12: qty 2

## 2021-11-12 MED ORDER — EPOETIN ALFA 4000 UNIT/ML IJ SOLN
4000.0000 [IU] | Freq: Once | INTRAMUSCULAR | Status: AC
  Administered 2021-11-12: 4000 [IU] via SUBCUTANEOUS

## 2021-11-26 ENCOUNTER — Encounter (HOSPITAL_COMMUNITY)
Admission: RE | Admit: 2021-11-26 | Discharge: 2021-11-26 | Disposition: A | Discharge status: Home / Self Care | Payer: Medicare PPO | Source: Ambulatory Visit | Attending: Nephrology | Admitting: Nephrology

## 2021-11-26 ENCOUNTER — Encounter (HOSPITAL_COMMUNITY): Payer: Self-pay

## 2021-11-26 DIAGNOSIS — N184 Chronic kidney disease, stage 4 (severe): Secondary | ICD-10-CM | POA: Diagnosis not present

## 2021-11-26 DIAGNOSIS — D631 Anemia in chronic kidney disease: Secondary | ICD-10-CM | POA: Diagnosis not present

## 2021-11-26 LAB — RENAL FUNCTION PANEL
Albumin: 4.2 g/dL (ref 3.5–5.0)
Anion gap: 10 (ref 5–15)
BUN: 55 mg/dL — ABNORMAL HIGH (ref 8–23)
CO2: 25 mmol/L (ref 22–32)
Calcium: 9.5 mg/dL (ref 8.9–10.3)
Chloride: 104 mmol/L (ref 98–111)
Creatinine, Ser: 2.85 mg/dL — ABNORMAL HIGH (ref 0.44–1.00)
GFR, Estimated: 15 mL/min — ABNORMAL LOW (ref >60)
Glucose, Bld: 185 mg/dL — ABNORMAL HIGH (ref 70–99)
Phosphorus: 3.6 mg/dL (ref 2.5–4.6)
Potassium: 4.4 mmol/L (ref 3.5–5.1)
Sodium: 139 mmol/L (ref 135–145)

## 2021-11-26 LAB — POCT HEMOGLOBIN-HEMACUE: Hemoglobin: 9 g/dL — ABNORMAL LOW (ref 12.0–15.0)

## 2021-11-26 MED ORDER — EPOETIN ALFA 4000 UNIT/ML IJ SOLN
8000.0000 [IU] | Freq: Once | INTRAMUSCULAR | Status: AC
  Administered 2021-11-26: 8000 [IU] via SUBCUTANEOUS
  Filled 2021-11-26: qty 2

## 2021-12-04 DIAGNOSIS — E1122 Type 2 diabetes mellitus with diabetic chronic kidney disease: Secondary | ICD-10-CM | POA: Diagnosis not present

## 2021-12-10 ENCOUNTER — Encounter (HOSPITAL_COMMUNITY)
Admission: RE | Admit: 2021-12-10 | Discharge: 2021-12-10 | Disposition: A | Discharge status: Home / Self Care | Payer: Medicare PPO | Source: Ambulatory Visit | Attending: Nephrology | Admitting: Nephrology

## 2021-12-10 DIAGNOSIS — N184 Chronic kidney disease, stage 4 (severe): Secondary | ICD-10-CM | POA: Insufficient documentation

## 2021-12-10 DIAGNOSIS — D631 Anemia in chronic kidney disease: Secondary | ICD-10-CM | POA: Diagnosis not present

## 2021-12-10 LAB — CBC
HCT: 29.5 % — ABNORMAL LOW (ref 36.0–46.0)
Hemoglobin: 8.9 g/dL — ABNORMAL LOW (ref 12.0–15.0)
MCH: 31.6 pg (ref 26.0–34.0)
MCHC: 30.2 g/dL (ref 30.0–36.0)
MCV: 104.6 fL — ABNORMAL HIGH (ref 80.0–100.0)
Platelets: 183 10*3/uL (ref 150–400)
RBC: 2.82 MIL/uL — ABNORMAL LOW (ref 3.87–5.11)
RDW: 14.7 % (ref 11.5–15.5)
WBC: 4 10*3/uL (ref 4.0–10.5)
nRBC: 0 % (ref 0.0–0.2)

## 2021-12-10 LAB — RENAL FUNCTION PANEL
Albumin: 4.1 g/dL (ref 3.5–5.0)
Anion gap: 10 (ref 5–15)
BUN: 61 mg/dL — ABNORMAL HIGH (ref 8–23)
CO2: 23 mmol/L (ref 22–32)
Calcium: 9.2 mg/dL (ref 8.9–10.3)
Chloride: 108 mmol/L (ref 98–111)
Creatinine, Ser: 2.97 mg/dL — ABNORMAL HIGH (ref 0.44–1.00)
GFR, Estimated: 15 mL/min — ABNORMAL LOW (ref >60)
Glucose, Bld: 172 mg/dL — ABNORMAL HIGH (ref 70–99)
Phosphorus: 3.5 mg/dL (ref 2.5–4.6)
Potassium: 4.4 mmol/L (ref 3.5–5.1)
Sodium: 141 mmol/L (ref 135–145)

## 2021-12-10 LAB — PROTEIN / CREATININE RATIO, URINE
Creatinine, Urine: 133.28 mg/dL
Protein Creatinine Ratio: 0.27 mg/mg{Cre} — ABNORMAL HIGH (ref 0.00–0.15)
Total Protein, Urine: 36 mg/dL

## 2021-12-10 LAB — POCT HEMOGLOBIN-HEMACUE: Hemoglobin: 8.6 g/dL — ABNORMAL LOW (ref 12.0–15.0)

## 2021-12-10 MED ORDER — EPOETIN ALFA 4000 UNIT/ML IJ SOLN
8000.0000 [IU] | INTRAMUSCULAR | Status: DC
  Administered 2021-12-10: 8000 [IU] via SUBCUTANEOUS
  Filled 2021-12-10: qty 2

## 2021-12-24 ENCOUNTER — Encounter (HOSPITAL_COMMUNITY)
Admission: RE | Admit: 2021-12-24 | Discharge: 2021-12-24 | Disposition: A | Discharge status: Home / Self Care | Payer: Medicare PPO | Source: Ambulatory Visit | Attending: Nephrology | Admitting: Nephrology

## 2021-12-24 DIAGNOSIS — R600 Localized edema: Secondary | ICD-10-CM | POA: Diagnosis not present

## 2021-12-24 DIAGNOSIS — D631 Anemia in chronic kidney disease: Secondary | ICD-10-CM | POA: Diagnosis not present

## 2021-12-24 DIAGNOSIS — N170 Acute kidney failure with tubular necrosis: Secondary | ICD-10-CM | POA: Diagnosis not present

## 2021-12-24 DIAGNOSIS — N184 Chronic kidney disease, stage 4 (severe): Secondary | ICD-10-CM | POA: Diagnosis not present

## 2021-12-24 DIAGNOSIS — I129 Hypertensive chronic kidney disease with stage 1 through stage 4 chronic kidney disease, or unspecified chronic kidney disease: Secondary | ICD-10-CM | POA: Diagnosis not present

## 2021-12-24 DIAGNOSIS — N189 Chronic kidney disease, unspecified: Secondary | ICD-10-CM | POA: Diagnosis not present

## 2021-12-24 DIAGNOSIS — R809 Proteinuria, unspecified: Secondary | ICD-10-CM | POA: Diagnosis not present

## 2021-12-24 DIAGNOSIS — E211 Secondary hyperparathyroidism, not elsewhere classified: Secondary | ICD-10-CM | POA: Diagnosis not present

## 2021-12-24 DIAGNOSIS — E1129 Type 2 diabetes mellitus with other diabetic kidney complication: Secondary | ICD-10-CM | POA: Diagnosis not present

## 2021-12-24 DIAGNOSIS — E1122 Type 2 diabetes mellitus with diabetic chronic kidney disease: Secondary | ICD-10-CM | POA: Diagnosis not present

## 2021-12-24 LAB — POCT HEMOGLOBIN-HEMACUE: Hemoglobin: 8.8 g/dL — ABNORMAL LOW (ref 12.0–15.0)

## 2021-12-24 MED ORDER — EPOETIN ALFA-EPBX 10000 UNIT/ML IJ SOLN
INTRAMUSCULAR | Status: AC
  Filled 2021-12-24: qty 1

## 2021-12-24 MED ORDER — EPOETIN ALFA-EPBX 10000 UNIT/ML IJ SOLN
4000.0000 [IU] | Freq: Once | INTRAMUSCULAR | Status: AC
  Administered 2021-12-24: 4000 [IU] via SUBCUTANEOUS

## 2022-01-08 ENCOUNTER — Encounter (HOSPITAL_COMMUNITY): Payer: Self-pay

## 2022-01-08 ENCOUNTER — Encounter (HOSPITAL_COMMUNITY)
Admission: RE | Admit: 2022-01-08 | Discharge: 2022-01-08 | Disposition: A | Discharge status: Home / Self Care | Payer: Medicare PPO | Source: Ambulatory Visit | Attending: Nephrology | Admitting: Nephrology

## 2022-01-08 DIAGNOSIS — N184 Chronic kidney disease, stage 4 (severe): Secondary | ICD-10-CM | POA: Insufficient documentation

## 2022-01-08 DIAGNOSIS — D631 Anemia in chronic kidney disease: Secondary | ICD-10-CM | POA: Diagnosis not present

## 2022-01-08 LAB — POCT HEMOGLOBIN-HEMACUE: Hemoglobin: 8.6 g/dL — ABNORMAL LOW (ref 12.0–15.0)

## 2022-01-08 MED ORDER — EPOETIN ALFA-EPBX 10000 UNIT/ML IJ SOLN
8000.0000 [IU] | Freq: Once | INTRAMUSCULAR | Status: AC
  Administered 2022-01-08: 8000 [IU] via SUBCUTANEOUS
  Filled 2022-01-08: qty 1

## 2022-01-19 ENCOUNTER — Ambulatory Visit: Payer: Medicare PPO | Admitting: Podiatry

## 2022-01-19 ENCOUNTER — Encounter: Payer: Self-pay | Admitting: Podiatry

## 2022-01-19 DIAGNOSIS — B351 Tinea unguium: Secondary | ICD-10-CM

## 2022-01-19 DIAGNOSIS — Z794 Long term (current) use of insulin: Secondary | ICD-10-CM | POA: Diagnosis not present

## 2022-01-19 DIAGNOSIS — D631 Anemia in chronic kidney disease: Secondary | ICD-10-CM | POA: Diagnosis not present

## 2022-01-19 DIAGNOSIS — M79675 Pain in left toe(s): Secondary | ICD-10-CM | POA: Diagnosis not present

## 2022-01-19 DIAGNOSIS — E1121 Type 2 diabetes mellitus with diabetic nephropathy: Secondary | ICD-10-CM

## 2022-01-19 DIAGNOSIS — I10 Essential (primary) hypertension: Secondary | ICD-10-CM | POA: Diagnosis not present

## 2022-01-19 DIAGNOSIS — M79674 Pain in right toe(s): Secondary | ICD-10-CM | POA: Diagnosis not present

## 2022-01-19 DIAGNOSIS — E1122 Type 2 diabetes mellitus with diabetic chronic kidney disease: Secondary | ICD-10-CM | POA: Diagnosis not present

## 2022-01-19 DIAGNOSIS — N184 Chronic kidney disease, stage 4 (severe): Secondary | ICD-10-CM | POA: Diagnosis not present

## 2022-01-22 ENCOUNTER — Encounter (HOSPITAL_COMMUNITY)
Admission: RE | Admit: 2022-01-22 | Discharge: 2022-01-22 | Disposition: A | Discharge status: Home / Self Care | Payer: Medicare PPO | Source: Ambulatory Visit | Attending: Nephrology | Admitting: Nephrology

## 2022-01-22 ENCOUNTER — Encounter (HOSPITAL_COMMUNITY): Payer: Self-pay

## 2022-01-22 DIAGNOSIS — N184 Chronic kidney disease, stage 4 (severe): Secondary | ICD-10-CM | POA: Diagnosis not present

## 2022-01-22 DIAGNOSIS — D631 Anemia in chronic kidney disease: Secondary | ICD-10-CM | POA: Diagnosis not present

## 2022-01-22 LAB — POCT HEMOGLOBIN-HEMACUE: Hemoglobin: 8.6 g/dL — ABNORMAL LOW (ref 12.0–15.0)

## 2022-01-22 MED ORDER — EPOETIN ALFA-EPBX 10000 UNIT/ML IJ SOLN
INTRAMUSCULAR | Status: AC
  Filled 2022-01-22: qty 1

## 2022-01-22 MED ORDER — EPOETIN ALFA-EPBX 10000 UNIT/ML IJ SOLN
8000.0000 [IU] | Freq: Once | INTRAMUSCULAR | Status: AC
  Administered 2022-01-22: 8000 [IU] via SUBCUTANEOUS

## 2022-01-25 NOTE — Progress Notes (Signed)
?  Subjective:  ?Patient ID: Stephanie Middleton, female    DOB: 1930-11-18,  MRN: [id number] ? ?Stephanie Middleton presents to clinic today for at risk foot care. Pt has h/o NIDDM with chronic kidney disease and painful thick toenails that are difficult to trim. Pain interferes with ambulation. Aggravating factors include wearing enclosed shoe gear. Pain is relieved with periodic professional debridement. ? ?Patient states blood glucose was 107 mg/dl today.   ? ?New problem(s): None.  ? ?PCP is Iona Beard, MD , and last visit was January, 2023. ? ?No Known Allergies ? ?Review of Systems: Negative except as noted in the HPI. ? ?Objective: No changes noted in today's physical examination. ? ?Stephanie Middleton is a pleasant 88 y.o. female in NAD. AAO X 3. ? ?Vascular Examination: ?CFT <3 seconds b/l LE. Palpable DP/PT pulses b/l LE. Digital hair sparse b/l. Skin temperature gradient WNL b/l. No pain with calf compression b/l. Trace edema noted b/l. No cyanosis or clubbing noted b/l LE. ? ?Dermatological Examination: ?Pedal integument with normal turgor, texture and tone b/l LE. No open wounds b/l. No interdigital macerations b/l. Toenails 1-5 b/l elongated, thickened, discolored with subungual debris. +Tenderness with dorsal palpation of nailplates. No hyperkeratotic or porokeratotic lesions present. Toenails 1-5 b/l elongated, discolored, dystrophic, thickened, crumbly with subungual debris and tenderness to dorsal palpation. Incurvated nailplate bilateral border(s) bilateral great toes. Nail border hypertrophy absent. There is tenderness to palpation. Sign(s) of infection: no clinical signs of infection noted on examination today.. ? ?Musculoskeletal Examination: ?Normal muscle strength 5/5 to all lower extremity muscle groups bilaterally. HAV with bunion deformity noted b/l LE. Hammertoe deformity noted 2-5 b/l.Marland Kitchen No pain, crepitus or joint limitation noted with ROM b/l LE.  Patient ambulates independently without  assistive aids. ? ?Neurological Examination: ?Protective sensation intact 5/5 intact bilaterally with 10g monofilament b/l. ? ? ?  Latest Ref Rng & Units 10/29/2021  ? 10:04 AM 04/16/2021  ? 10:47 AM  ?Hemoglobin A1C  ?Hemoglobin-A1c 4.8 - 5.6 % 6.5   6.6    ? ?Assessment/Plan: ?1. Pain due to onychomycosis of toenails of both feet   ?2. Type 2 diabetes mellitus with diabetic nephropathy, with long-term current use of insulin ([place])   ?  ?-Examined patient. ?-Toenails 1-5 b/l were debrided in length and girth with sterile nail nippers and dremel without iatrogenic bleeding.  ?-Offending nail border debrided and curretaged bilateral great toes utilizing sterile nail nipper and currette. Border cleansed with alcohol and triple antibiotic applied. No further treatment required by patient/caregiver. ?-Patient/POA to call should there be question/concern in the interim.  ? ?Return in about 3 months (around 04/20/2022). ? ?Marzetta Board, DPM  ?

## 2022-02-05 ENCOUNTER — Encounter (HOSPITAL_COMMUNITY): Payer: Self-pay

## 2022-02-05 ENCOUNTER — Encounter (HOSPITAL_COMMUNITY)
Admission: RE | Admit: 2022-02-05 | Discharge: 2022-02-05 | Disposition: A | Discharge status: Home / Self Care | Payer: Medicare PPO | Source: Ambulatory Visit | Attending: Nephrology | Admitting: Nephrology

## 2022-02-05 DIAGNOSIS — D631 Anemia in chronic kidney disease: Secondary | ICD-10-CM | POA: Insufficient documentation

## 2022-02-05 DIAGNOSIS — N184 Chronic kidney disease, stage 4 (severe): Secondary | ICD-10-CM | POA: Diagnosis not present

## 2022-02-05 LAB — POCT HEMOGLOBIN-HEMACUE: Hemoglobin: 9 g/dL — ABNORMAL LOW (ref 12.0–15.0)

## 2022-02-05 MED ORDER — EPOETIN ALFA-EPBX 10000 UNIT/ML IJ SOLN
8000.0000 [IU] | Freq: Once | INTRAMUSCULAR | Status: AC
  Administered 2022-02-05: 8000 [IU] via SUBCUTANEOUS

## 2022-02-05 MED ORDER — EPOETIN ALFA-EPBX 10000 UNIT/ML IJ SOLN
INTRAMUSCULAR | Status: AC
  Filled 2022-02-05: qty 1

## 2022-02-11 DIAGNOSIS — E119 Type 2 diabetes mellitus without complications: Secondary | ICD-10-CM | POA: Diagnosis not present

## 2022-02-19 ENCOUNTER — Encounter (HOSPITAL_COMMUNITY)
Admission: RE | Admit: 2022-02-19 | Discharge: 2022-02-19 | Disposition: A | Discharge status: Home / Self Care | Payer: Medicare PPO | Source: Ambulatory Visit | Attending: Nephrology | Admitting: Nephrology

## 2022-02-19 VITALS — BP 152/56 | HR 66 | Temp 98.7°F | Resp 18

## 2022-02-19 DIAGNOSIS — D631 Anemia in chronic kidney disease: Secondary | ICD-10-CM | POA: Diagnosis not present

## 2022-02-19 DIAGNOSIS — N1831 Chronic kidney disease, stage 3a: Secondary | ICD-10-CM

## 2022-02-19 DIAGNOSIS — N184 Chronic kidney disease, stage 4 (severe): Secondary | ICD-10-CM | POA: Diagnosis not present

## 2022-02-19 LAB — CBC WITH DIFFERENTIAL/PLATELET
Abs Immature Granulocytes: 0.02 10*3/uL (ref 0.00–0.07)
Basophils Absolute: 0 10*3/uL (ref 0.0–0.1)
Basophils Relative: 1 %
Eosinophils Absolute: 0.1 10*3/uL (ref 0.0–0.5)
Eosinophils Relative: 2 %
HCT: 28.2 % — ABNORMAL LOW (ref 36.0–46.0)
Hemoglobin: 8.6 g/dL — ABNORMAL LOW (ref 12.0–15.0)
Immature Granulocytes: 1 %
Lymphocytes Relative: 22 %
Lymphs Abs: 0.9 10*3/uL (ref 0.7–4.0)
MCH: 30.7 pg (ref 26.0–34.0)
MCHC: 30.5 g/dL (ref 30.0–36.0)
MCV: 100.7 fL — ABNORMAL HIGH (ref 80.0–100.0)
Monocytes Absolute: 0.4 10*3/uL (ref 0.1–1.0)
Monocytes Relative: 9 %
Neutro Abs: 2.6 10*3/uL (ref 1.7–7.7)
Neutrophils Relative %: 65 %
Platelets: 179 10*3/uL (ref 150–400)
RBC: 2.8 MIL/uL — ABNORMAL LOW (ref 3.87–5.11)
RDW: 14.6 % (ref 11.5–15.5)
WBC: 4 10*3/uL (ref 4.0–10.5)
nRBC: 0 % (ref 0.0–0.2)

## 2022-02-19 LAB — RENAL FUNCTION PANEL
Albumin: 4.5 g/dL (ref 3.5–5.0)
Anion gap: 8 (ref 5–15)
BUN: 68 mg/dL — ABNORMAL HIGH (ref 8–23)
CO2: 24 mmol/L (ref 22–32)
Calcium: 9.5 mg/dL (ref 8.9–10.3)
Chloride: 108 mmol/L (ref 98–111)
Creatinine, Ser: 3.08 mg/dL — ABNORMAL HIGH (ref 0.44–1.00)
GFR, Estimated: 14 mL/min — ABNORMAL LOW (ref >60)
Glucose, Bld: 172 mg/dL — ABNORMAL HIGH (ref 70–99)
Phosphorus: 4.3 mg/dL (ref 2.5–4.6)
Potassium: 4.2 mmol/L (ref 3.5–5.1)
Sodium: 140 mmol/L (ref 135–145)

## 2022-02-19 LAB — POCT HEMOGLOBIN-HEMACUE: Hemoglobin: 9.1 g/dL — ABNORMAL LOW (ref 12.0–15.0)

## 2022-02-19 LAB — PROTEIN / CREATININE RATIO, URINE
Creatinine, Urine: 116.02 mg/dL
Protein Creatinine Ratio: 0.7 mg/mg{Cre} — ABNORMAL HIGH (ref 0.00–0.15)
Total Protein, Urine: 81 mg/dL

## 2022-02-19 MED ORDER — EPOETIN ALFA-EPBX 10000 UNIT/ML IJ SOLN
INTRAMUSCULAR | Status: AC
  Filled 2022-02-19: qty 1

## 2022-02-19 MED ORDER — EPOETIN ALFA-EPBX 10000 UNIT/ML IJ SOLN
8000.0000 [IU] | Freq: Once | INTRAMUSCULAR | Status: AC
  Administered 2022-02-19: 8000 [IU] via SUBCUTANEOUS

## 2022-03-03 ENCOUNTER — Encounter (HOSPITAL_COMMUNITY)
Admission: RE | Admit: 2022-03-03 | Discharge: 2022-03-03 | Disposition: A | Discharge status: Home / Self Care | Payer: Medicare PPO | Source: Ambulatory Visit | Attending: Nephrology | Admitting: Nephrology

## 2022-03-03 VITALS — BP 135/55 | HR 66 | Temp 98.8°F | Resp 18

## 2022-03-03 DIAGNOSIS — N184 Chronic kidney disease, stage 4 (severe): Secondary | ICD-10-CM | POA: Diagnosis not present

## 2022-03-03 DIAGNOSIS — D631 Anemia in chronic kidney disease: Secondary | ICD-10-CM | POA: Diagnosis not present

## 2022-03-03 LAB — POCT HEMOGLOBIN-HEMACUE: Hemoglobin: 9.2 g/dL — ABNORMAL LOW (ref 12.0–15.0)

## 2022-03-03 MED ORDER — EPOETIN ALFA-EPBX 10000 UNIT/ML IJ SOLN
8000.0000 [IU] | Freq: Once | INTRAMUSCULAR | Status: AC
  Administered 2022-03-03: 8000 [IU] via SUBCUTANEOUS
  Filled 2022-03-03: qty 1

## 2022-03-05 DIAGNOSIS — N189 Chronic kidney disease, unspecified: Secondary | ICD-10-CM | POA: Diagnosis not present

## 2022-03-05 DIAGNOSIS — D631 Anemia in chronic kidney disease: Secondary | ICD-10-CM | POA: Diagnosis not present

## 2022-03-05 DIAGNOSIS — N185 Chronic kidney disease, stage 5: Secondary | ICD-10-CM | POA: Diagnosis not present

## 2022-03-05 DIAGNOSIS — N170 Acute kidney failure with tubular necrosis: Secondary | ICD-10-CM | POA: Diagnosis not present

## 2022-03-05 DIAGNOSIS — R809 Proteinuria, unspecified: Secondary | ICD-10-CM | POA: Diagnosis not present

## 2022-03-05 DIAGNOSIS — R600 Localized edema: Secondary | ICD-10-CM | POA: Diagnosis not present

## 2022-03-05 DIAGNOSIS — E211 Secondary hyperparathyroidism, not elsewhere classified: Secondary | ICD-10-CM | POA: Diagnosis not present

## 2022-03-05 DIAGNOSIS — I129 Hypertensive chronic kidney disease with stage 1 through stage 4 chronic kidney disease, or unspecified chronic kidney disease: Secondary | ICD-10-CM | POA: Diagnosis not present

## 2022-03-05 DIAGNOSIS — E1122 Type 2 diabetes mellitus with diabetic chronic kidney disease: Secondary | ICD-10-CM | POA: Diagnosis not present

## 2022-03-06 DIAGNOSIS — E1122 Type 2 diabetes mellitus with diabetic chronic kidney disease: Secondary | ICD-10-CM | POA: Diagnosis not present

## 2022-03-07 ENCOUNTER — Ambulatory Visit
Admission: EM | Admit: 2022-03-07 | Discharge: 2022-03-07 | Disposition: A | Discharge status: Home / Self Care | Payer: Medicare PPO | Attending: Family Medicine | Admitting: Family Medicine

## 2022-03-07 ENCOUNTER — Ambulatory Visit (INDEPENDENT_AMBULATORY_CARE_PROVIDER_SITE_OTHER): Payer: Medicare PPO

## 2022-03-07 DIAGNOSIS — S92515A Nondisplaced fracture of proximal phalanx of left lesser toe(s), initial encounter for closed fracture: Secondary | ICD-10-CM | POA: Diagnosis not present

## 2022-03-07 DIAGNOSIS — M79672 Pain in left foot: Secondary | ICD-10-CM | POA: Diagnosis not present

## 2022-03-07 DIAGNOSIS — M7989 Other specified soft tissue disorders: Secondary | ICD-10-CM | POA: Diagnosis not present

## 2022-03-07 NOTE — ED Triage Notes (Signed)
Pt states she had a piece of wood fall on her left foot baby toe about 3 weeks ago and she is just concerned because she is a diabetic  Pt states no bleeding  Pt states she takes tylenol for pain

## 2022-03-07 NOTE — Discharge Instructions (Signed)
[facility] Address: [address], [place], [place] [address] Phone: [phone] 

## 2022-03-07 NOTE — ED Provider Notes (Signed)
[facility]    CSN: [id number] Arrival date & time: 03/07/22  1010      History   Chief Complaint Chief Complaint  Patient presents with   Toe Injury    Left foot toe injury    HPI Stephanie Middleton is a 88 y.o. female.   Presenting with persistent left distal fifth toe pain after dropping something on 2 or 3 weeks ago.  The area has been swollen, painful but no skin injury, discoloration, numbness, tingling, weakness.  Not trying anything other than Epsom salt soaks for symptoms.   Past Medical History:  Diagnosis Date   Anemia    Chronic kidney disease    Chronic pain    Back, right hip and right knee   Diabetes mellitus without complication ([place])    Hypercholesteremia    Hypertension     Patient Active Problem List   Diagnosis Date Noted   Anemia in chronic kidney disease 07/25/2010   Chronic kidney disease 07/25/2010   Essential (primary) hypertension 07/25/2010   Low back pain 07/25/2010   Pure hypercholesterolemia 07/25/2010   Type 2 diabetes mellitus with diabetic nephropathy ([place]) 07/25/2010   Type 2 diabetes mellitus with hyperglycemia ([place]) 07/25/2010    Past Surgical History:  Procedure Laterality Date   CHOLECYSTECTOMY     CYST EXCISION      OB History   No obstetric history on file.      Home Medications    Prior to Admission medications   Medication Sig Start Date End Date Taking? Authorizing Provider  ACCU-CHEK GUIDE test strip  06/18/20   [provider]  Accu-Chek Softclix Lancets lancets  06/18/20   [provider]  acetaminophen (TYLENOL) 500 MG tablet Take 500 mg by mouth 2 (two) times a day.    [provider]  allopurinol (ZYLOPRIM) 100 MG tablet Take 100 mg by mouth daily.    [provider]  amLODipine (NORVASC) 5 MG tablet Take 5 mg by mouth daily.    [provider]  amLODipine-valsartan (EXFORGE) 10-160 MG tablet Take 1 tablet by mouth daily. exforge HCT 5-160 mg/12.5 mg     [provider]  aspirin EC 81 MG tablet Take 81 mg by mouth daily.    [provider]  calcitRIOL (ROCALTROL) 0.25 MCG capsule Take 0.25 mcg by mouth daily.    [provider]  Cholecalciferol 125 MCG (5000 UT) capsule Take 5,000 Units by mouth daily.    [provider]  cholestyramine light (PREVALITE) 4 g packet Take 4 g by mouth 2 (two) times daily.    [provider]  colchicine 0.6 MG tablet Take 0.6 mg by mouth daily as needed.    [provider]  epoetin alfa (EPOGEN) 4000 UNIT/ML injection Inject 8,000 Units into the skin every 14 (fourteen) days.     Fran Lowes, MD  furosemide (LASIX) 20 MG tablet Take by mouth. 12/11/20 12/11/21  [provider]  glipiZIDE (GLUCOTROL) 5 MG tablet Take by mouth 2 (two) times daily before a meal. Take 2 tablets twice daily    [provider]  guanFACINE (TENEX) 2 MG tablet Take 2 mg by mouth at bedtime.    [provider]  hydrochlorothiazide (HYDRODIURIL) 12.5 MG tablet  03/27/20   [provider]  HYDROcodone-acetaminophen (NORCO/VICODIN) 5-325 MG tablet Take 1 tablet by mouth every 12 (twelve) hours as needed for moderate pain (take 1/2 tablet every 12 hours as needed).    [provider]  iron polysaccharides (NIFEREX) 150 MG capsule Take 150 mg by mouth 2 (two) times daily.    [provider]  labetalol (NORMODYNE) 200 MG tablet Take 200 mg by mouth 2 (two) times daily. Take 2 tablets twice daily    [provider]  LEVEMIR FLEXTOUCH 100 UNIT/ML FlexPen  07/28/20   [provider]  linagliptin (TRADJENTA) 5 MG TABS tablet Take 5 mg by mouth daily.    [provider]  pravastatin (PRAVACHOL) 40 MG tablet Take 40 mg by mouth at bedtime.    [provider]  predniSONE (STERAPRED UNI-PAK 21 TAB) 10 MG (21) TBPK tablet Take 10 mg by mouth daily. Finished dose pack on Monday 05/15/2019.    [provider]   RABEprazole (ACIPHEX) 20 MG tablet Take 20 mg by mouth daily as needed. 1 tablet by mouth everyday as needed for indigestion    [provider]  valsartan (DIOVAN) 160 MG tablet Take 160 mg by mouth daily.    [provider]    Family History No family history on file.  Social History Social History   Tobacco Use   Smoking status: Never   Smokeless tobacco: Never  Vaping Use   Vaping Use: Never used  Substance Use Topics   Alcohol use: Never   Drug use: Never     Allergies   Patient has no known allergies.   Review of Systems Review of Systems Per HPI  Physical Exam Triage Vital Signs ED Triage Vitals  Enc Vitals Group     BP 03/07/22 1041 (!) 165/75     Pulse Rate 03/07/22 1041 65     Resp 03/07/22 1041 18     Temp 03/07/22 1041 98.1 F (36.7 C)     Temp Source 03/07/22 1041 Oral     SpO2 03/07/22 1041 99 %     Weight --      Height --      Head Circumference --      Peak Flow --      Pain Score 03/07/22 1037 5     Pain Loc --      Pain Edu? --      Excl. in [place]? --    No data found.  Updated Vital Signs BP (!) 165/75 (BP Location: Right Arm)   Pulse 65   Temp 98.1 F (36.7 C) (Oral)   Resp 18   SpO2 99%   Visual Acuity Right Eye Distance:   Left Eye Distance:   Bilateral Distance:    Right Eye Near:   Left Eye Near:    Bilateral Near:     Physical Exam Vitals and nursing note reviewed.  Constitutional:      Appearance: Normal appearance. She is not ill-appearing.  HENT:     Head: Atraumatic.  Eyes:     Extraocular Movements: Extraocular movements intact.     Conjunctiva/sclera: Conjunctivae normal.  Cardiovascular:     Rate and Rhythm: Normal rate and regular rhythm.     Heart sounds: Normal heart sounds.  Pulmonary:     Effort: Pulmonary effort is normal.     Breath sounds: Normal breath sounds.  Musculoskeletal:        General: Swelling, tenderness and signs of injury present. Normal range of motion.      Cervical back: Normal range of motion and neck supple.     Comments: Left fifth toe distally tender to palpation with diffuse swelling of the toe.  Range of motion appears intact.  No palpable deformity  Skin:    General: Skin is warm and dry.  Neurological:     Mental Status: She is alert and oriented to person, place, and time.     Comments: Left foot appears neurovascularly intact  Psychiatric:        Mood and Affect: Mood normal.        Thought Content: Thought content normal.        Judgment: Judgment normal.     UC Treatments / Results  Labs (all labs ordered are listed, but only abnormal results are displayed) Labs Reviewed - No data to display  EKG   Radiology DG Foot Complete Left  Result Date: 03/07/2022 CLINICAL DATA:  Left fifth toe pain after injury 3 weeks ago EXAM: LEFT FOOT - COMPLETE 3+ VIEW COMPARISON:  None Available. FINDINGS: Acute comminuted fracture of the proximal phalanx of the fifth toe with intra-articular extension to the interphalangeal joint. No dislocation. No additional fractures. Hallux valgus deformity. Moderate osteoarthritis of the first MTP joint. Mild degenerative changes elsewhere. Bones appear demineralized. Mild diffuse soft tissue swelling. IMPRESSION: Acute comminuted fracture of the proximal phalanx of the fifth toe with intra-articular extension to the interphalangeal joint. Electronically Signed   By: Davina Poke D.O.   On: 03/07/2022 11:28    Procedures Procedures (including critical care time)  Medications Ordered in UC Medications - No data to display  Initial Impression / Assessment and Plan / UC Course  I have reviewed the triage vital signs and the nursing notes.  Pertinent labs & imaging results that were available during my care of the patient were reviewed by me and considered in my medical decision making (see chart for details).     X-ray of the left foot showing a comminuted fracture of the proximal phalanx of the  fifth toe.  Will place in a postop shoe, discussed over-the-counter supportive medications and home care and close podiatry follow-up for recheck.  Final Clinical Impressions(s) / UC Diagnoses   Final diagnoses:  Closed nondisplaced fracture of proximal phalanx of lesser toe of left foot, initial encounter     Discharge Instructions      [facility] Address: [address], [place], [place] [address] Phone: [phone]    ED Prescriptions   None    PDMP not reviewed this encounter.   Volney American, [place] 03/07/22 1145

## 2022-03-19 ENCOUNTER — Encounter (HOSPITAL_COMMUNITY)
Admission: RE | Admit: 2022-03-19 | Discharge: 2022-03-19 | Disposition: A | Discharge status: Home / Self Care | Payer: Medicare PPO | Source: Ambulatory Visit | Attending: Nephrology | Admitting: Nephrology

## 2022-03-19 VITALS — BP 157/67 | HR 67 | Temp 98.6°F | Resp 16 | Ht 64.0 in | Wt 179.0 lb

## 2022-03-19 DIAGNOSIS — N1831 Chronic kidney disease, stage 3a: Secondary | ICD-10-CM | POA: Insufficient documentation

## 2022-03-19 DIAGNOSIS — N184 Chronic kidney disease, stage 4 (severe): Secondary | ICD-10-CM | POA: Diagnosis not present

## 2022-03-19 LAB — POCT HEMOGLOBIN-HEMACUE: Hemoglobin: 8.8 g/dL — ABNORMAL LOW (ref 12.0–15.0)

## 2022-03-19 MED ORDER — EPOETIN ALFA-EPBX 10000 UNIT/ML IJ SOLN
INTRAMUSCULAR | Status: AC
  Filled 2022-03-19: qty 1

## 2022-03-19 MED ORDER — EPOETIN ALFA-EPBX 10000 UNIT/ML IJ SOLN
8000.0000 [IU] | Freq: Once | INTRAMUSCULAR | Status: AC
  Administered 2022-03-19: 8000 [IU] via SUBCUTANEOUS

## 2022-03-24 ENCOUNTER — Ambulatory Visit: Payer: Medicare PPO | Admitting: Podiatry

## 2022-03-24 ENCOUNTER — Encounter: Payer: Self-pay | Admitting: Podiatry

## 2022-03-24 DIAGNOSIS — S92503A Displaced unspecified fracture of unspecified lesser toe(s), initial encounter for closed fracture: Secondary | ICD-10-CM | POA: Diagnosis not present

## 2022-03-24 NOTE — Progress Notes (Signed)
  Subjective:  Patient ID: Stephanie Middleton, female    DOB: August 17, 1931,   MRN: [id number]  Chief Complaint  Patient presents with   Toe Injury     Left foot fracture    88 y.o. female presents for concern of left pinky toe fracture. She was seen in urgent care on 6/3 and x-rays were taken She was told it was fractured. Original injury was 2 weeks ago or around April when a piece of wood fell on the toe. Marland Kitchen She was given surgical shoe and told to follow up . Denies any other pedal complaints. Denies n/v/f/c.   Past Medical History:  Diagnosis Date   Anemia    Chronic kidney disease    Chronic pain    Back, right hip and right knee   Diabetes mellitus without complication (H[facility])    Hypercholesteremia    Hypertension     Objective:  Physical Exam: Vascular: DP/PT pulses 2/4 bilateral. CFT <3 seconds. Normal hair growth on digits. No edema.  Skin. No lacerations or abrasions bilateral feet.  Musculoskeletal: MMT 5/5 bilateral lower extremities in DF, PF, Inversion and Eversion. Deceased ROM in DF of ankle joint. No tenderness or pain to palpation around the fifth toe.  Neurological: Sensation intact to light touch.   Assessment:   1. Closed fracture of phalanx of fifth toe      Plan:  Patient was evaluated and treated and all questions answered. -Xrays reviewed from previous which show fracture in distal phalanx of left fifth digit.  -Discussed treatement options for toe fracture; risks, alternatives, and benefits explained. -May transiiton out of the shoe into regular shoes.  -Recommend protection, rest, ice, elevation daily if needed.  -Rx pain med/antinflammatories as needed -Patient to return to office as scheduled for rfc.    Lorenda Peck, DPM

## 2022-04-02 ENCOUNTER — Encounter (HOSPITAL_COMMUNITY): Payer: Self-pay

## 2022-04-02 ENCOUNTER — Encounter (HOSPITAL_COMMUNITY)
Admission: RE | Admit: 2022-04-02 | Discharge: 2022-04-02 | Disposition: A | Discharge status: Home / Self Care | Payer: Medicare PPO | Source: Ambulatory Visit | Attending: Nephrology | Admitting: Nephrology

## 2022-04-02 VITALS — BP 156/61 | HR 66 | Temp 98.9°F | Resp 16 | Ht 64.0 in | Wt 179.0 lb

## 2022-04-02 DIAGNOSIS — N1831 Chronic kidney disease, stage 3a: Secondary | ICD-10-CM | POA: Diagnosis not present

## 2022-04-02 DIAGNOSIS — N184 Chronic kidney disease, stage 4 (severe): Secondary | ICD-10-CM | POA: Diagnosis not present

## 2022-04-02 LAB — POCT HEMOGLOBIN-HEMACUE: Hemoglobin: 9.2 g/dL — ABNORMAL LOW (ref 12.0–15.0)

## 2022-04-02 MED ORDER — EPOETIN ALFA-EPBX 10000 UNIT/ML IJ SOLN
INTRAMUSCULAR | Status: AC
  Filled 2022-04-02: qty 1

## 2022-04-02 MED ORDER — EPOETIN ALFA-EPBX 10000 UNIT/ML IJ SOLN
8000.0000 [IU] | Freq: Once | INTRAMUSCULAR | Status: AC
  Administered 2022-04-02: 8000 [IU] via SUBCUTANEOUS

## 2022-04-16 ENCOUNTER — Encounter (HOSPITAL_COMMUNITY)
Admission: RE | Admit: 2022-04-16 | Discharge: 2022-04-16 | Disposition: A | Discharge status: Home / Self Care | Payer: Medicare PPO | Source: Ambulatory Visit | Attending: Nephrology | Admitting: Nephrology

## 2022-04-16 VITALS — BP 155/67 | HR 64 | Temp 98.7°F | Resp 16

## 2022-04-16 DIAGNOSIS — D631 Anemia in chronic kidney disease: Secondary | ICD-10-CM | POA: Diagnosis not present

## 2022-04-16 DIAGNOSIS — N184 Chronic kidney disease, stage 4 (severe): Secondary | ICD-10-CM | POA: Diagnosis not present

## 2022-04-16 LAB — POCT HEMOGLOBIN-HEMACUE: Hemoglobin: 8.6 g/dL — ABNORMAL LOW (ref 12.0–15.0)

## 2022-04-16 MED ORDER — EPOETIN ALFA 10000 UNIT/ML IJ SOLN
10000.0000 [IU] | Freq: Once | INTRAMUSCULAR | Status: AC
  Administered 2022-04-16: 10000 [IU] via SUBCUTANEOUS

## 2022-04-16 MED ORDER — EPOETIN ALFA 10000 UNIT/ML IJ SOLN
INTRAMUSCULAR | Status: AC
  Filled 2022-04-16: qty 1

## 2022-04-16 MED ORDER — EPOETIN ALFA-EPBX 10000 UNIT/ML IJ SOLN: 8000.0000 [IU] | Freq: Once | INTRAMUSCULAR | Status: DC

## 2022-04-20 DIAGNOSIS — E1122 Type 2 diabetes mellitus with diabetic chronic kidney disease: Secondary | ICD-10-CM | POA: Diagnosis not present

## 2022-04-20 DIAGNOSIS — Z794 Long term (current) use of insulin: Secondary | ICD-10-CM | POA: Diagnosis not present

## 2022-04-20 DIAGNOSIS — M25551 Pain in right hip: Secondary | ICD-10-CM | POA: Diagnosis not present

## 2022-04-20 DIAGNOSIS — M150 Primary generalized (osteo)arthritis: Secondary | ICD-10-CM | POA: Diagnosis not present

## 2022-04-20 DIAGNOSIS — I130 Hypertensive heart and chronic kidney disease with heart failure and stage 1 through stage 4 chronic kidney disease, or unspecified chronic kidney disease: Secondary | ICD-10-CM | POA: Diagnosis not present

## 2022-04-20 DIAGNOSIS — N184 Chronic kidney disease, stage 4 (severe): Secondary | ICD-10-CM | POA: Diagnosis not present

## 2022-04-20 DIAGNOSIS — D631 Anemia in chronic kidney disease: Secondary | ICD-10-CM | POA: Diagnosis not present

## 2022-04-20 DIAGNOSIS — S22010S Wedge compression fracture of first thoracic vertebra, sequela: Secondary | ICD-10-CM | POA: Diagnosis not present

## 2022-04-22 ENCOUNTER — Ambulatory Visit: Payer: Medicare PPO | Admitting: Podiatry

## 2022-04-22 ENCOUNTER — Encounter: Payer: Self-pay | Admitting: Podiatry

## 2022-04-22 DIAGNOSIS — E1121 Type 2 diabetes mellitus with diabetic nephropathy: Secondary | ICD-10-CM

## 2022-04-22 DIAGNOSIS — B351 Tinea unguium: Secondary | ICD-10-CM | POA: Diagnosis not present

## 2022-04-22 DIAGNOSIS — M79675 Pain in left toe(s): Secondary | ICD-10-CM

## 2022-04-22 DIAGNOSIS — M79674 Pain in right toe(s): Secondary | ICD-10-CM | POA: Diagnosis not present

## 2022-04-22 DIAGNOSIS — Z794 Long term (current) use of insulin: Secondary | ICD-10-CM

## 2022-04-27 NOTE — Progress Notes (Signed)
  Subjective:  Patient ID: Stephanie Middleton, female    DOB: Sep 15, 1931,  MRN: [id number]  Beaulah P Irmen presents to clinic today for at risk foot care. Pt has h/o NIDDM with chronic kidney disease and painful elongated mycotic toenails 1-5 bilaterally which are tender when wearing enclosed shoe gear. Pain is relieved with periodic professional debridement.  Patient states blood glucose was 107 mg/dl today.  Last A1c was 6.5%.  New problem(s): None.   Her daughter is present during today's visit. She saw Dr. Blenda Mounts for follow up of toe fracture of left 5th toe. She has transitioned into regular shoes.  PCP is Iona Beard, MD , and last visit was  April 20, 2022  No Known Allergies  Review of Systems: Negative except as noted in the HPI.  Objective:  Mollye P Juba is a pleasant 88 y.o. female in NAD. AAO X 3.  Vascular Examination: CFT <3 seconds b/l LE. Palpable DP/PT pulses b/l LE. Digital hair sparse b/l. Skin temperature gradient WNL b/l. No pain with calf compression b/l. Trace edema noted b/l. No cyanosis or clubbing noted b/l LE.  Dermatological Examination: Pedal integument with normal turgor, texture and tone b/l LE. No open wounds b/l. No interdigital macerations b/l. Toenails 1-5 b/l elongated, thickened, discolored with subungual debris. +Tenderness with dorsal palpation of nailplates. No hyperkeratotic or porokeratotic lesions present. Toenails 1-5 b/l elongated, discolored, dystrophic, thickened, crumbly with subungual debris and tenderness to dorsal palpation.   Musculoskeletal Examination: Normal muscle strength 5/5 to all lower extremity muscle groups bilaterally. HAV with bunion deformity noted b/l LE. Hammertoe deformity noted 2-5 b/l.Marland Kitchen No pain, crepitus or joint limitation noted with ROM b/l LE.  Patient ambulates with cane assistance.  Neurological Examination: Protective sensation intact 5/5 intact bilaterally with 10g monofilament b/l.     Latest Ref Rng &  Units 10/29/2021   10:04 AM  Hemoglobin A1C  Hemoglobin-A1c 4.8 - 5.6 % 6.5    Assessment/Plan: 1. Pain due to onychomycosis of toenails of both feet   2. Type 2 diabetes mellitus with diabetic nephropathy, with long-term current use of insulin ([place])      -Patient was evaluated and treated. All patient's and/or POA's questions/concerns answered on today's visit. -Patient to continue soft, supportive shoe gear daily. -Mycotic toenails 1-5 bilaterally were debrided in length and girth with sterile nail nippers and dremel without incident. -Patient/POA to call should there be question/concern in the interim.   Return in about 3 months (around 07/23/2022).  Marzetta Board, DPM

## 2022-04-30 ENCOUNTER — Encounter (HOSPITAL_COMMUNITY): Payer: Self-pay

## 2022-04-30 ENCOUNTER — Encounter (HOSPITAL_COMMUNITY)
Admission: RE | Admit: 2022-04-30 | Discharge: 2022-04-30 | Disposition: A | Discharge status: Home / Self Care | Payer: Medicare PPO | Source: Ambulatory Visit | Attending: Nephrology | Admitting: Nephrology

## 2022-04-30 VITALS — BP 136/63 | HR 69 | Temp 97.9°F | Resp 18 | Ht 64.0 in | Wt 179.0 lb

## 2022-04-30 DIAGNOSIS — N184 Chronic kidney disease, stage 4 (severe): Secondary | ICD-10-CM

## 2022-04-30 DIAGNOSIS — D631 Anemia in chronic kidney disease: Secondary | ICD-10-CM | POA: Diagnosis not present

## 2022-04-30 LAB — POCT HEMOGLOBIN-HEMACUE: Hemoglobin: 9.1 g/dL — ABNORMAL LOW (ref 12.0–15.0)

## 2022-04-30 MED ORDER — EPOETIN ALFA 10000 UNIT/ML IJ SOLN
INTRAMUSCULAR | Status: AC
  Filled 2022-04-30: qty 1

## 2022-04-30 MED ORDER — EPOETIN ALFA-EPBX 10000 UNIT/ML IJ SOLN: 8000.0000 [IU] | Freq: Once | INTRAMUSCULAR | Status: DC

## 2022-04-30 MED ORDER — EPOETIN ALFA 10000 UNIT/ML IJ SOLN
10000.0000 [IU] | Freq: Once | INTRAMUSCULAR | Status: AC
  Administered 2022-04-30: 10000 [IU] via SUBCUTANEOUS

## 2022-05-14 ENCOUNTER — Encounter (HOSPITAL_COMMUNITY)
Admission: RE | Admit: 2022-05-14 | Discharge: 2022-05-14 | Disposition: A | Discharge status: Home / Self Care | Payer: Medicare PPO | Source: Ambulatory Visit | Attending: Nephrology | Admitting: Nephrology

## 2022-05-14 ENCOUNTER — Encounter: Payer: Self-pay | Admitting: Orthopedic Surgery

## 2022-05-14 ENCOUNTER — Ambulatory Visit: Payer: Medicare PPO | Admitting: Orthopedic Surgery

## 2022-05-14 VITALS — BP 149/68 | HR 68 | Temp 97.8°F | Resp 14

## 2022-05-14 VITALS — BP 177/68 | HR 72 | Ht 62.0 in | Wt 174.0 lb

## 2022-05-14 DIAGNOSIS — M5410 Radiculopathy, site unspecified: Secondary | ICD-10-CM

## 2022-05-14 DIAGNOSIS — D631 Anemia in chronic kidney disease: Secondary | ICD-10-CM | POA: Diagnosis not present

## 2022-05-14 DIAGNOSIS — M48062 Spinal stenosis, lumbar region with neurogenic claudication: Secondary | ICD-10-CM

## 2022-05-14 DIAGNOSIS — N184 Chronic kidney disease, stage 4 (severe): Secondary | ICD-10-CM | POA: Insufficient documentation

## 2022-05-14 LAB — RENAL FUNCTION PANEL
Albumin: 4.6 g/dL (ref 3.5–5.0)
Anion gap: 12 (ref 5–15)
BUN: 63 mg/dL — ABNORMAL HIGH (ref 8–23)
CO2: 23 mmol/L (ref 22–32)
Calcium: 9.6 mg/dL (ref 8.9–10.3)
Chloride: 107 mmol/L (ref 98–111)
Creatinine, Ser: 3.39 mg/dL — ABNORMAL HIGH (ref 0.44–1.00)
GFR, Estimated: 13 mL/min — ABNORMAL LOW (ref >60)
Glucose, Bld: 74 mg/dL (ref 70–99)
Phosphorus: 4 mg/dL (ref 2.5–4.6)
Potassium: 4.4 mmol/L (ref 3.5–5.1)
Sodium: 142 mmol/L (ref 135–145)

## 2022-05-14 LAB — PROTEIN / CREATININE RATIO, URINE
Creatinine, Urine: 72.35 mg/dL
Protein Creatinine Ratio: 0.25 mg/mg{Cre} — ABNORMAL HIGH (ref 0.00–0.15)
Total Protein, Urine: 18 mg/dL

## 2022-05-14 LAB — CBC WITH DIFFERENTIAL/PLATELET
Abs Immature Granulocytes: 0.03 10*3/uL (ref 0.00–0.07)
Basophils Absolute: 0.1 10*3/uL (ref 0.0–0.1)
Basophils Relative: 1 %
Eosinophils Absolute: 0.2 10*3/uL (ref 0.0–0.5)
Eosinophils Relative: 4 %
HCT: 31 % — ABNORMAL LOW (ref 36.0–46.0)
Hemoglobin: 9.6 g/dL — ABNORMAL LOW (ref 12.0–15.0)
Immature Granulocytes: 1 %
Lymphocytes Relative: 31 %
Lymphs Abs: 1.5 10*3/uL (ref 0.7–4.0)
MCH: 31.9 pg (ref 26.0–34.0)
MCHC: 31 g/dL (ref 30.0–36.0)
MCV: 103 fL — ABNORMAL HIGH (ref 80.0–100.0)
Monocytes Absolute: 0.4 10*3/uL (ref 0.1–1.0)
Monocytes Relative: 9 %
Neutro Abs: 2.7 10*3/uL (ref 1.7–7.7)
Neutrophils Relative %: 54 %
Platelets: 199 10*3/uL (ref 150–400)
RBC: 3.01 MIL/uL — ABNORMAL LOW (ref 3.87–5.11)
RDW: 14.9 % (ref 11.5–15.5)
WBC: 5 10*3/uL (ref 4.0–10.5)
nRBC: 0 % (ref 0.0–0.2)

## 2022-05-14 LAB — POCT HEMOGLOBIN-HEMACUE: Hemoglobin: 9.5 g/dL — ABNORMAL LOW (ref 12.0–15.0)

## 2022-05-14 MED ORDER — EPOETIN ALFA-EPBX 10000 UNIT/ML IJ SOLN: 8000.0000 [IU] | Freq: Once | INTRAMUSCULAR | Status: DC

## 2022-05-14 MED ORDER — EPOETIN ALFA 10000 UNIT/ML IJ SOLN
INTRAMUSCULAR | Status: AC
  Administered 2022-05-14: 10000 [IU]
  Filled 2022-05-14: qty 1

## 2022-05-14 MED ORDER — EPOETIN ALFA 10000 UNIT/ML IJ SOLN: 10000.0000 [IU] | Freq: Once | INTRAMUSCULAR | Status: DC

## 2022-05-14 NOTE — Progress Notes (Signed)
Chief Complaint  Patient presents with   New Patient (Initial Visit)   Hip Pain    RT hip radiates into knee    HPI: 88 year old female comes in with right hip pain.  Pain radiates from her right hip to her right knee.  She has had pain for about 6 or 7 years she has had therapy on 1 occasion  She is seeing neurosurgery and surgery was not recommended based on the patient's age symptoms and constellation of findings  However she presents back with the same hip pain which is actually in her right lower back    Past Medical History:  Diagnosis Date   Anemia    Chronic kidney disease    Chronic pain    Back, right hip and right knee   Diabetes mellitus without complication (HCC)    Hypercholesteremia    Hypertension     BP (!) 177/68   Pulse 72   Ht 5\' 2"  (1.575 m)   Wt 174 lb (78.9 kg)   BMI 31.83 kg/m    General appearance: Well-developed well-nourished no gross deformities  Cardiovascular normal pulse and perfusion normal color without edema  Neurologically no sensation loss or deficits or pathologic reflexes  Psychological: Awake alert and oriented x3 mood and affect normal  Skin no lacerations or ulcerations no nodularity no palpable masses, no erythema or nodularity  Musculoskeletal:   Kasandra Knudsen supported ambulation   she is tender in the right lower back and somewhat in the midline it does not cross over to the left side  Hip range of motion is normal leg lengths were equal there was no pain with range of motion of either hip  Imaging Previous MRI was done in 2017   My personal interpretation of the spinal images from 2017 is that the patient has severe spinal stenosis in the L4-5 region with L3-4 mild disease and note of fairly normal L5-S1 segment  According to the report: Findings at that time included grade 2 anterolisthesis L4 on 5 and then at L4-5 there was severe bilateral facet arthritis with ligamentum flavum infolding resulting in severe spinal  stenosis with bilateral lateral recess stenosis and severe right foraminal stenosis  A/P  The patient is interested in physical therapy versus injection but may want injections in the future however an MRI from 2017 does not give [place] up-to-date information regarding the stenosis  So we will order an MRI as well as try physical therapy.

## 2022-05-14 NOTE — Patient Instructions (Signed)
While we are working on your approval for MRI please go ahead and call to schedule your appointment with [facility] within at least one (1) week.   Central Scheduling [phone]  

## 2022-05-19 DIAGNOSIS — H25813 Combined forms of age-related cataract, bilateral: Secondary | ICD-10-CM | POA: Diagnosis not present

## 2022-05-19 DIAGNOSIS — H4920 Sixth [abducent] nerve palsy, unspecified eye: Secondary | ICD-10-CM | POA: Diagnosis not present

## 2022-05-19 DIAGNOSIS — H532 Diplopia: Secondary | ICD-10-CM | POA: Diagnosis not present

## 2022-05-19 DIAGNOSIS — E119 Type 2 diabetes mellitus without complications: Secondary | ICD-10-CM | POA: Diagnosis not present

## 2022-05-20 DIAGNOSIS — R809 Proteinuria, unspecified: Secondary | ICD-10-CM | POA: Diagnosis not present

## 2022-05-20 DIAGNOSIS — I129 Hypertensive chronic kidney disease with stage 1 through stage 4 chronic kidney disease, or unspecified chronic kidney disease: Secondary | ICD-10-CM | POA: Diagnosis not present

## 2022-05-20 DIAGNOSIS — N170 Acute kidney failure with tubular necrosis: Secondary | ICD-10-CM | POA: Diagnosis not present

## 2022-05-20 DIAGNOSIS — N189 Chronic kidney disease, unspecified: Secondary | ICD-10-CM | POA: Diagnosis not present

## 2022-05-20 DIAGNOSIS — E1129 Type 2 diabetes mellitus with other diabetic kidney complication: Secondary | ICD-10-CM | POA: Diagnosis not present

## 2022-05-20 DIAGNOSIS — E211 Secondary hyperparathyroidism, not elsewhere classified: Secondary | ICD-10-CM | POA: Diagnosis not present

## 2022-05-20 DIAGNOSIS — D631 Anemia in chronic kidney disease: Secondary | ICD-10-CM | POA: Diagnosis not present

## 2022-05-28 ENCOUNTER — Encounter (HOSPITAL_COMMUNITY)
Admission: RE | Admit: 2022-05-28 | Discharge: 2022-05-28 | Disposition: A | Discharge status: Home / Self Care | Payer: Medicare PPO | Source: Ambulatory Visit | Attending: Nephrology | Admitting: Nephrology

## 2022-05-28 VITALS — BP 168/54 | HR 74 | Temp 97.8°F | Resp 16 | Ht 62.0 in | Wt 173.9 lb

## 2022-05-28 DIAGNOSIS — N184 Chronic kidney disease, stage 4 (severe): Secondary | ICD-10-CM | POA: Diagnosis not present

## 2022-05-28 DIAGNOSIS — D631 Anemia in chronic kidney disease: Secondary | ICD-10-CM | POA: Diagnosis not present

## 2022-05-28 LAB — POCT HEMOGLOBIN-HEMACUE: Hemoglobin: 9.2 g/dL — ABNORMAL LOW (ref 12.0–15.0)

## 2022-05-28 MED ORDER — EPOETIN ALFA-EPBX 10000 UNIT/ML IJ SOLN
8000.0000 [IU] | Freq: Once | INTRAMUSCULAR | Status: AC
  Administered 2022-05-28: 8000 [IU] via SUBCUTANEOUS

## 2022-05-28 MED ORDER — EPOETIN ALFA-EPBX 10000 UNIT/ML IJ SOLN
INTRAMUSCULAR | Status: AC
  Filled 2022-05-28: qty 1

## 2022-06-01 ENCOUNTER — Ambulatory Visit (HOSPITAL_COMMUNITY)
Admission: RE | Admit: 2022-06-01 | Discharge: 2022-06-01 | Disposition: A | Discharge status: Home / Self Care | Payer: Medicare PPO | Source: Ambulatory Visit | Attending: Orthopedic Surgery | Admitting: Orthopedic Surgery

## 2022-06-01 DIAGNOSIS — M545 Low back pain, unspecified: Secondary | ICD-10-CM | POA: Diagnosis not present

## 2022-06-01 DIAGNOSIS — M5410 Radiculopathy, site unspecified: Secondary | ICD-10-CM | POA: Diagnosis not present

## 2022-06-01 DIAGNOSIS — M4316 Spondylolisthesis, lumbar region: Secondary | ICD-10-CM | POA: Diagnosis not present

## 2022-06-01 DIAGNOSIS — M48062 Spinal stenosis, lumbar region with neurogenic claudication: Secondary | ICD-10-CM | POA: Insufficient documentation

## 2022-06-04 ENCOUNTER — Ambulatory Visit: Payer: Medicare PPO | Admitting: Orthopedic Surgery

## 2022-06-04 ENCOUNTER — Encounter: Payer: Self-pay | Admitting: Orthopedic Surgery

## 2022-06-04 VITALS — Ht 62.0 in | Wt 173.0 lb

## 2022-06-04 DIAGNOSIS — M48062 Spinal stenosis, lumbar region with neurogenic claudication: Secondary | ICD-10-CM | POA: Diagnosis not present

## 2022-06-04 NOTE — Progress Notes (Signed)
Chief Complaint  Patient presents with   Back Pain    L- Spine MRI results.     I read the results of the MRI and I read the MRI I think basically with dealing with spinal stenosis as I thought  Patient is agreeable to physical therapy  We will arrange physical therapy and follow-up with patient in several months if not improved patient is a candidate for epidural.  L3-4: Disc desiccation. Disc bulging, a left foraminal disc protrusion, and moderate facet and ligamentum flavum hypertrophy result in mild-to-moderate left neural foraminal stenosis without spinal stenosis, unchanged.   L4-5: Disc desiccation and severe disc space narrowing. Possible prior right laminotomy. Anterolisthesis with bulging uncovered disc, left ligamentum flavum hypertrophy, and severe facet arthrosis result in severe spinal stenosis and severe right and mild left neural foraminal stenosis, overall mildly progressed.   L5-S1: Prior right laminotomy. Minimal disc bulging and mild facet and ligamentum flavum hypertrophy result in borderline to mild left neural foraminal stenosis without spinal stenosis, unchanged.   IMPRESSION: 1. Severe L4-5 disc and facet degeneration with grade 2 anterolisthesis, severe spinal stenosis, and severe right neural foraminal stenosis, mildly progressed from 2017. 2. Unchanged disc and facet degeneration elsewhere as above.     Electronically Signed   By: Logan Bores M.D.   On: 06/02/2022 15:24

## 2022-06-04 NOTE — Patient Instructions (Signed)
Physical therapy has been ordered for you at [facility] They should call you to schedule, [phone]  is the phone number to call, if you want to call to schedule.   

## 2022-06-06 DIAGNOSIS — E1122 Type 2 diabetes mellitus with diabetic chronic kidney disease: Secondary | ICD-10-CM | POA: Diagnosis not present

## 2022-06-10 ENCOUNTER — Ambulatory Visit: Payer: Medicare PPO | Admitting: Diagnostic Neuroimaging

## 2022-06-10 ENCOUNTER — Encounter: Payer: Self-pay | Admitting: *Deleted

## 2022-06-10 VITALS — BP 153/73 | HR 68 | Ht 62.0 in | Wt 172.1 lb

## 2022-06-10 DIAGNOSIS — H532 Diplopia: Secondary | ICD-10-CM

## 2022-06-10 DIAGNOSIS — H4922 Sixth [abducent] nerve palsy, left eye: Secondary | ICD-10-CM

## 2022-06-10 NOTE — Progress Notes (Signed)
[facility]  PATIENT: Stephanie Middleton DOB: 1931/06/22  REFERRING CLINICIAN: Clent Jacks, MD HISTORY FROM: PATIENT  REASON FOR VISIT: NEW CONSULT    HISTORICAL  CHIEF COMPLAINT:  Chief Complaint  Patient presents with   New Patient (Initial Visit)    Pt is feeling fine. She states she is still seeing double vision not as bad as when she went to eye. She can see straight with one eye covered but with both is when she is experiencing double. Her reports she has been experiencing it since May 16, 2022. Room 6 with daughter    HISTORY OF PRESENT ILLNESS:   88 year old female here for evaluation double vision.  05/16/2022 patient had sudden onset of double vision with both eyes open.  Went to ophthalmologist and was diagnosed with left 6th nerve palsy.  Symptoms have slightly improved since that time.  Last hemoglobin A1c 6.5.  Has hypertension and is on medication.  No other associated symptoms such as numbness, slurred speech, dizziness, weakness.  No headaches.   REVIEW OF SYSTEMS: Full 14 system review of systems performed and negative with exception of: as per HPI.  ALLERGIES: No Known Allergies  HOME MEDICATIONS: Outpatient Medications Prior to Visit  Medication Sig Dispense Refill   ACCU-CHEK GUIDE test strip      Accu-Chek Softclix Lancets lancets      acetaminophen (TYLENOL) 500 MG tablet Take 500 mg by mouth 2 (two) times a day.     allopurinol (ZYLOPRIM) 100 MG tablet Take 100 mg by mouth daily.     amLODipine-valsartan (EXFORGE) 10-160 MG tablet Take 1 tablet by mouth daily. exforge HCT 5-160 mg/12.5 mg     aspirin EC 81 MG tablet Take 81 mg by mouth daily.     B-D ULTRAFINE III SHORT PEN 31G X 8 MM MISC Inject into the skin as directed.     calcitRIOL (ROCALTROL) 0.25 MCG capsule Take 0.25 mcg by mouth daily.     epoetin alfa (EPOGEN) 4000 UNIT/ML injection Inject 8,000 Units into the skin every 14 (fourteen) days.      glipiZIDE (GLUCOTROL)  5 MG tablet Take by mouth 2 (two) times daily before a meal. Take 2 tablets twice daily     hydrochlorothiazide (HYDRODIURIL) 12.5 MG tablet      iron polysaccharides (NIFEREX) 150 MG capsule Take 150 mg by mouth 2 (two) times daily.     labetalol (NORMODYNE) 200 MG tablet Take 200 mg by mouth 2 (two) times daily. Take 2 tablets twice daily     LEVEMIR FLEXTOUCH 100 UNIT/ML FlexPen      linagliptin (TRADJENTA) 5 MG TABS tablet Take 5 mg by mouth daily.     pravastatin (PRAVACHOL) 40 MG tablet Take 40 mg by mouth at bedtime.     RABEprazole (ACIPHEX) 20 MG tablet Take 20 mg by mouth daily as needed. 1 tablet by mouth everyday as needed for indigestion     amLODipine (NORVASC) 5 MG tablet Take 5 mg by mouth daily. (Patient not taking: Reported on 06/10/2022)     Cholecalciferol 125 MCG (5000 UT) capsule Take 5,000 Units by mouth daily. (Patient not taking: Reported on 06/10/2022)     cholestyramine light (PREVALITE) 4 g packet Take 4 g by mouth 2 (two) times daily. (Patient not taking: Reported on 06/10/2022)     colchicine 0.6 MG tablet Take 0.6 mg by mouth daily as needed. (Patient not taking: Reported on 06/10/2022)     furosemide (LASIX) 20 MG  tablet Take by mouth.     guanFACINE (TENEX) 2 MG tablet Take 2 mg by mouth at bedtime. (Patient not taking: Reported on 06/10/2022)     HYDROcodone-acetaminophen (NORCO/VICODIN) 5-325 MG tablet Take 1 tablet by mouth every 12 (twelve) hours as needed for moderate pain (take 1/2 tablet every 12 hours as needed). (Patient not taking: Reported on 06/10/2022)     predniSONE (STERAPRED UNI-PAK 21 TAB) 10 MG (21) TBPK tablet Take 10 mg by mouth daily. Finished dose pack on Monday 05/15/2019. (Patient not taking: Reported on 06/10/2022)     valsartan (DIOVAN) 40 MG tablet Take 40 mg by mouth daily.     No facility-administered medications prior to visit.    PAST MEDICAL HISTORY: Past Medical History:  Diagnosis Date   Anemia    Chronic kidney disease    Chronic pain     Back, right hip and right knee   Diabetes mellitus without complication ([place])    Diplopia    Hypercholesteremia    Hypertension     PAST SURGICAL HISTORY: Past Surgical History:  Procedure Laterality Date   CHOLECYSTECTOMY     CYST EXCISION      FAMILY HISTORY: No family history on file.  SOCIAL HISTORY: Social History   Socioeconomic History   Marital status: Widowed    Spouse name: Not on file   Number of children: Not on file   Years of education: Not on file   Highest education level: Not on file  Occupational History   Not on file  Tobacco Use   Smoking status: Never   Smokeless tobacco: Never  Vaping Use   Vaping Use: Never used  Substance and Sexual Activity   Alcohol use: Never   Drug use: Never   Sexual activity: Not Currently    Birth control/protection: None  Other Topics Concern   Not on file  Social History Narrative   Not on file   Social Determinants of Health   Financial Resource Strain: Not on file  Food Insecurity: Not on file  Transportation Needs: Not on file  Physical Activity: Not on file  Stress: Not on file  Social Connections: Not on file  Intimate Partner Violence: Not on file     PHYSICAL EXAM  GENERAL EXAM/CONSTITUTIONAL: Vitals:  Vitals:   06/10/22 0955  BP: (!) 153/73  Pulse: 68  Weight: 172 lb 2 oz (78.1 kg)  Height: 5\' 2"  (1.575 m)   Body mass index is 31.48 kg/m. Wt Readings from Last 3 Encounters:  06/10/22 172 lb 2 oz (78.1 kg)  06/04/22 173 lb (78.5 kg)  05/28/22 173 lb 15.1 oz (78.9 kg)   Patient is in no distress; well developed, nourished and groomed; neck is supple  CARDIOVASCULAR: Examination of carotid arteries is normal; no carotid bruits Regular rate and rhythm, no murmurs Examination of peripheral vascular system by observation and palpation is normal  EYES: Ophthalmoscopic exam of optic discs and posterior segments is normal; no papilledema or hemorrhages No results  found.  MUSCULOSKELETAL: Gait, strength, tone, movements noted in Neurologic exam below  NEUROLOGIC: MENTAL STATUS:      No data to display         awake, alert, oriented to person, place and time recent and remote memory intact normal attention and concentration language fluent, comprehension intact, naming intact fund of knowledge appropriate  CRANIAL NERVE:  2nd - no papilledema on fundoscopic exam 2nd, 3rd, 4th, 6th - pupils equal and reactive to light, visual fields  full to confrontation, extraocular muscles --? LEFT EYE DEVIATED MEDIALLY, CANNOT ABDUCT PAST MIDLINE ON LEFT GAZE 5th - facial sensation symmetric 7th - facial strength symmetric 8th - hearing intact 9th - palate elevates symmetrically, uvula midline 11th - shoulder shrug symmetric 12th - tongue protrusion midline  MOTOR:  normal bulk and tone, full strength in the BUE, BLE  SENSORY:  normal and symmetric to light touch, temperature, vibration; EXCEPT DECR IN RIGHT FOOT / LEG  COORDINATION:  finger-nose-finger, fine finger movements normal  REFLEXES:  deep tendon reflexes TRACE and symmetric  GAIT/STATION:  narrow based gait     DIAGNOSTIC DATA (LABS, IMAGING, TESTING) - I reviewed patient records, labs, notes, testing and imaging myself where available.  Lab Results  Component Value Date   WBC 5.0 05/14/2022   HGB 9.2 (L) 05/28/2022   HCT 31.0 (L) 05/14/2022   MCV 103.0 (H) 05/14/2022   PLT 199 05/14/2022      Component Value Date/Time   NA 142 05/14/2022 1459   K 4.4 05/14/2022 1459   CL 107 05/14/2022 1459   CO2 23 05/14/2022 1459   GLUCOSE 74 05/14/2022 1459   BUN 63 (H) 05/14/2022 1459   CREATININE 3.39 (H) 05/14/2022 1459   CALCIUM 9.6 05/14/2022 1459   CALCIUM 9.7 10/15/2021 1020   PROT 7.3 01/31/2020 1022   ALBUMIN 4.6 05/14/2022 1459   AST 15 01/31/2020 1022   ALT 15 01/31/2020 1022   ALKPHOS 54 01/31/2020 1022   BILITOT 0.5 01/31/2020 1022   GFRNONAA 13 (L)  05/14/2022 1459   GFRAA 17 (L) 06/19/2020 1011   Lab Results  Component Value Date   CHOL 163 10/29/2021   HDL 35 (L) 10/29/2021   LDLCALC 96 10/29/2021   TRIG 160 (H) 10/29/2021   CHOLHDL 4.7 10/29/2021   Lab Results  Component Value Date   HGBA1C 6.5 (H) 10/29/2021   Lab Results  Component Value Date   VITAMINB12 221 06/11/2021   No results found for: "TSH"     ASSESSMENT AND PLAN  88 y.o. year old female here with:  Dx:  1. Left abducens nerve palsy   2. Double vision with both eyes open      PLAN:  LEFT CN6 palsy (likely microvascular infarction to left abducens nerve, related to diabetes and hypertension) - check MRI brain, orbits - check AchR ab, CK, TSH - continue aspirin 81, BP control, DM control - check lipid panel per PCP  Orders Placed This Encounter  Procedures   MR BRAIN WO CONTRAST   MR ORBITS WO CONTRAST   AChR Abs with Reflex to MuSK   TSH   CK   Aldolase   Vitamin B12   Return for pending test results, pending if symptoms worsen or fail to improve.    Penni Bombard, MD 02/06/980, 19:14 AM Certified in Neurology, Neurophysiology and Neuroimaging  [facility] [address] [place], [place] [address] [phone]

## 2022-06-10 NOTE — Patient Instructions (Signed)
LEFT CN6 palsy  - check MRI brain, orbits  - check AchR ab, CK, TSH  - continue aspirin 81, BP control, DM control  - check lipid panel per PCP

## 2022-06-11 ENCOUNTER — Encounter (HOSPITAL_COMMUNITY)
Admission: RE | Admit: 2022-06-11 | Discharge: 2022-06-11 | Disposition: A | Discharge status: Home / Self Care | Payer: Medicare PPO | Source: Ambulatory Visit | Attending: Nephrology | Admitting: Nephrology

## 2022-06-11 DIAGNOSIS — D631 Anemia in chronic kidney disease: Secondary | ICD-10-CM | POA: Diagnosis not present

## 2022-06-11 DIAGNOSIS — N184 Chronic kidney disease, stage 4 (severe): Secondary | ICD-10-CM | POA: Diagnosis not present

## 2022-06-11 LAB — RENAL FUNCTION PANEL
Albumin: 4.5 g/dL (ref 3.5–5.0)
Anion gap: 9 (ref 5–15)
BUN: 60 mg/dL — ABNORMAL HIGH (ref 8–23)
CO2: 23 mmol/L (ref 22–32)
Calcium: 9.6 mg/dL (ref 8.9–10.3)
Chloride: 106 mmol/L (ref 98–111)
Creatinine, Ser: 3.01 mg/dL — ABNORMAL HIGH (ref 0.44–1.00)
GFR, Estimated: 14 mL/min — ABNORMAL LOW (ref >60)
Glucose, Bld: 126 mg/dL — ABNORMAL HIGH (ref 70–99)
Phosphorus: 3.7 mg/dL (ref 2.5–4.6)
Potassium: 4.2 mmol/L (ref 3.5–5.1)
Sodium: 138 mmol/L (ref 135–145)

## 2022-06-11 LAB — POCT HEMOGLOBIN-HEMACUE: Hemoglobin: 8.4 g/dL — ABNORMAL LOW (ref 12.0–15.0)

## 2022-06-11 LAB — PROTEIN / CREATININE RATIO, URINE
Creatinine, Urine: 165.87 mg/dL
Protein Creatinine Ratio: 0.66 mg/mg{Cre} — ABNORMAL HIGH (ref 0.00–0.15)
Total Protein, Urine: 110 mg/dL

## 2022-06-11 MED ORDER — EPOETIN ALFA-EPBX 10000 UNIT/ML IJ SOLN
8000.0000 [IU] | Freq: Once | INTRAMUSCULAR | Status: DC
Start: 2022-06-11 — End: 2022-06-11

## 2022-06-11 MED ORDER — EPOETIN ALFA 10000 UNIT/ML IJ SOLN
INTRAMUSCULAR | Status: AC
  Filled 2022-06-11: qty 1

## 2022-06-11 MED ORDER — EPOETIN ALFA 10000 UNIT/ML IJ SOLN
10000.0000 [IU] | Freq: Once | INTRAMUSCULAR | Status: AC
  Administered 2022-06-11: 10000 [IU] via SUBCUTANEOUS
  Filled 2022-06-11: qty 1

## 2022-06-12 ENCOUNTER — Telehealth: Payer: Self-pay | Admitting: Diagnostic Neuroimaging

## 2022-06-12 NOTE — Telephone Encounter (Signed)
Mcarthur Rossetti Josem Kaufmann: [phone] exp. 06/12/22-07/12/22 sent to [facility]

## 2022-06-18 DIAGNOSIS — N170 Acute kidney failure with tubular necrosis: Secondary | ICD-10-CM | POA: Diagnosis not present

## 2022-06-18 DIAGNOSIS — N185 Chronic kidney disease, stage 5: Secondary | ICD-10-CM | POA: Diagnosis not present

## 2022-06-18 DIAGNOSIS — R809 Proteinuria, unspecified: Secondary | ICD-10-CM | POA: Diagnosis not present

## 2022-06-18 DIAGNOSIS — I129 Hypertensive chronic kidney disease with stage 1 through stage 4 chronic kidney disease, or unspecified chronic kidney disease: Secondary | ICD-10-CM | POA: Diagnosis not present

## 2022-06-18 DIAGNOSIS — R600 Localized edema: Secondary | ICD-10-CM | POA: Diagnosis not present

## 2022-06-18 DIAGNOSIS — N189 Chronic kidney disease, unspecified: Secondary | ICD-10-CM | POA: Diagnosis not present

## 2022-06-18 DIAGNOSIS — D631 Anemia in chronic kidney disease: Secondary | ICD-10-CM | POA: Diagnosis not present

## 2022-06-18 DIAGNOSIS — E1122 Type 2 diabetes mellitus with diabetic chronic kidney disease: Secondary | ICD-10-CM | POA: Diagnosis not present

## 2022-06-18 DIAGNOSIS — E1129 Type 2 diabetes mellitus with other diabetic kidney complication: Secondary | ICD-10-CM | POA: Diagnosis not present

## 2022-06-19 LAB — MUSK ANTIBODIES: MuSK Antibodies: <1 U/mL

## 2022-06-19 LAB — TSH: TSH: 2.6 u[IU]/mL (ref 0.450–4.500)

## 2022-06-19 LAB — ALDOLASE: Aldolase: 1.9 U/L — ABNORMAL LOW (ref 3.3–10.3)

## 2022-06-19 LAB — CK: Total CK: 287 U/L — ABNORMAL HIGH (ref 26–161)

## 2022-06-19 LAB — ACHR ABS WITH REFLEX TO MUSK: AChR Binding Ab, Serum: <0.03 nmol/L (ref 0.00–0.24)

## 2022-06-19 LAB — VITAMIN B12: Vitamin B-12: 360 pg/mL (ref 232–1245)

## 2022-06-25 ENCOUNTER — Encounter (HOSPITAL_COMMUNITY): Payer: Self-pay

## 2022-06-25 ENCOUNTER — Encounter (HOSPITAL_COMMUNITY)
Admission: RE | Admit: 2022-06-25 | Discharge: 2022-06-25 | Disposition: A | Discharge status: Home / Self Care | Payer: Medicare PPO | Source: Ambulatory Visit | Attending: Nephrology | Admitting: Nephrology

## 2022-06-25 VITALS — BP 137/69 | HR 73 | Temp 98.4°F | Resp 18 | Ht 62.0 in | Wt 172.1 lb

## 2022-06-25 DIAGNOSIS — D631 Anemia in chronic kidney disease: Secondary | ICD-10-CM | POA: Diagnosis not present

## 2022-06-25 DIAGNOSIS — N1831 Chronic kidney disease, stage 3a: Secondary | ICD-10-CM

## 2022-06-25 DIAGNOSIS — N184 Chronic kidney disease, stage 4 (severe): Secondary | ICD-10-CM | POA: Diagnosis not present

## 2022-06-25 LAB — POCT HEMOGLOBIN-HEMACUE: Hemoglobin: 9.2 g/dL — ABNORMAL LOW (ref 12.0–15.0)

## 2022-06-25 MED ORDER — EPOETIN ALFA 10000 UNIT/ML IJ SOLN
10000.0000 [IU] | Freq: Once | INTRAMUSCULAR | Status: AC
  Administered 2022-06-25: 10000 [IU] via SUBCUTANEOUS

## 2022-06-25 MED ORDER — EPOETIN ALFA-EPBX 10000 UNIT/ML IJ SOLN: 8000.0000 [IU] | Freq: Once | INTRAMUSCULAR | Status: DC

## 2022-06-25 MED ORDER — EPOETIN ALFA 10000 UNIT/ML IJ SOLN
INTRAMUSCULAR | Status: AC
  Filled 2022-06-25: qty 1

## 2022-06-25 MED ORDER — EPOETIN ALFA-EPBX 10000 UNIT/ML IJ SOLN
INTRAMUSCULAR | Status: DC
Start: 2022-06-25 — End: 2022-06-25
  Filled 2022-06-25: qty 1

## 2022-06-26 DIAGNOSIS — M25551 Pain in right hip: Secondary | ICD-10-CM | POA: Diagnosis not present

## 2022-06-26 DIAGNOSIS — M62551 Muscle wasting and atrophy, not elsewhere classified, right thigh: Secondary | ICD-10-CM | POA: Diagnosis not present

## 2022-06-26 DIAGNOSIS — R269 Unspecified abnormalities of gait and mobility: Secondary | ICD-10-CM | POA: Diagnosis not present

## 2022-06-26 DIAGNOSIS — M545 Low back pain, unspecified: Secondary | ICD-10-CM | POA: Diagnosis not present

## 2022-06-26 DIAGNOSIS — M62552 Muscle wasting and atrophy, not elsewhere classified, left thigh: Secondary | ICD-10-CM | POA: Diagnosis not present

## 2022-06-27 ENCOUNTER — Ambulatory Visit (HOSPITAL_COMMUNITY)
Admission: RE | Admit: 2022-06-27 | Discharge: 2022-06-27 | Disposition: A | Discharge status: Home / Self Care | Payer: Medicare PPO | Source: Ambulatory Visit | Attending: Diagnostic Neuroimaging | Admitting: Diagnostic Neuroimaging

## 2022-06-27 DIAGNOSIS — H4922 Sixth [abducent] nerve palsy, left eye: Secondary | ICD-10-CM | POA: Diagnosis not present

## 2022-06-27 DIAGNOSIS — H532 Diplopia: Secondary | ICD-10-CM | POA: Insufficient documentation

## 2022-07-03 DIAGNOSIS — M62551 Muscle wasting and atrophy, not elsewhere classified, right thigh: Secondary | ICD-10-CM | POA: Diagnosis not present

## 2022-07-03 DIAGNOSIS — M545 Low back pain, unspecified: Secondary | ICD-10-CM | POA: Diagnosis not present

## 2022-07-03 DIAGNOSIS — R269 Unspecified abnormalities of gait and mobility: Secondary | ICD-10-CM | POA: Diagnosis not present

## 2022-07-03 DIAGNOSIS — M25551 Pain in right hip: Secondary | ICD-10-CM | POA: Diagnosis not present

## 2022-07-03 DIAGNOSIS — M62552 Muscle wasting and atrophy, not elsewhere classified, left thigh: Secondary | ICD-10-CM | POA: Diagnosis not present

## 2022-07-09 ENCOUNTER — Encounter (HOSPITAL_COMMUNITY): Payer: Self-pay

## 2022-07-09 ENCOUNTER — Encounter (HOSPITAL_COMMUNITY)
Admission: RE | Admit: 2022-07-09 | Discharge: 2022-07-09 | Disposition: A | Discharge status: Home / Self Care | Payer: Medicare PPO | Source: Ambulatory Visit | Attending: Nephrology | Admitting: Nephrology

## 2022-07-09 VITALS — BP 145/63 | HR 75 | Temp 99.2°F | Resp 20 | Ht 62.0 in | Wt 172.2 lb

## 2022-07-09 DIAGNOSIS — N184 Chronic kidney disease, stage 4 (severe): Secondary | ICD-10-CM | POA: Insufficient documentation

## 2022-07-09 DIAGNOSIS — D631 Anemia in chronic kidney disease: Secondary | ICD-10-CM | POA: Diagnosis not present

## 2022-07-09 DIAGNOSIS — E119 Type 2 diabetes mellitus without complications: Secondary | ICD-10-CM | POA: Insufficient documentation

## 2022-07-09 LAB — POCT HEMOGLOBIN-HEMACUE: Hemoglobin: 9.4 g/dL — ABNORMAL LOW (ref 12.0–15.0)

## 2022-07-09 MED ORDER — EPOETIN ALFA-EPBX 10000 UNIT/ML IJ SOLN
8000.0000 [IU] | Freq: Once | INTRAMUSCULAR | Status: AC
  Administered 2022-07-09: 8000 [IU] via SUBCUTANEOUS

## 2022-07-09 MED ORDER — EPOETIN ALFA-EPBX 10000 UNIT/ML IJ SOLN
INTRAMUSCULAR | Status: AC
  Filled 2022-07-09: qty 1

## 2022-07-10 DIAGNOSIS — M62551 Muscle wasting and atrophy, not elsewhere classified, right thigh: Secondary | ICD-10-CM | POA: Diagnosis not present

## 2022-07-10 DIAGNOSIS — M545 Low back pain, unspecified: Secondary | ICD-10-CM | POA: Diagnosis not present

## 2022-07-10 DIAGNOSIS — M62552 Muscle wasting and atrophy, not elsewhere classified, left thigh: Secondary | ICD-10-CM | POA: Diagnosis not present

## 2022-07-10 DIAGNOSIS — M25551 Pain in right hip: Secondary | ICD-10-CM | POA: Diagnosis not present

## 2022-07-10 DIAGNOSIS — R269 Unspecified abnormalities of gait and mobility: Secondary | ICD-10-CM | POA: Diagnosis not present

## 2022-07-17 DIAGNOSIS — R269 Unspecified abnormalities of gait and mobility: Secondary | ICD-10-CM | POA: Diagnosis not present

## 2022-07-17 DIAGNOSIS — M545 Low back pain, unspecified: Secondary | ICD-10-CM | POA: Diagnosis not present

## 2022-07-17 DIAGNOSIS — M25551 Pain in right hip: Secondary | ICD-10-CM | POA: Diagnosis not present

## 2022-07-17 DIAGNOSIS — M62551 Muscle wasting and atrophy, not elsewhere classified, right thigh: Secondary | ICD-10-CM | POA: Diagnosis not present

## 2022-07-17 DIAGNOSIS — M62552 Muscle wasting and atrophy, not elsewhere classified, left thigh: Secondary | ICD-10-CM | POA: Diagnosis not present

## 2022-07-20 ENCOUNTER — Ambulatory Visit: Payer: Medicare PPO | Admitting: Orthopedic Surgery

## 2022-07-21 ENCOUNTER — Telehealth: Payer: Self-pay | Admitting: Podiatry

## 2022-07-21 ENCOUNTER — Encounter: Payer: Self-pay | Admitting: Podiatry

## 2022-07-21 DIAGNOSIS — Z0000 Encounter for general adult medical examination without abnormal findings: Secondary | ICD-10-CM | POA: Diagnosis not present

## 2022-07-21 DIAGNOSIS — H4920 Sixth [abducent] nerve palsy, unspecified eye: Secondary | ICD-10-CM | POA: Diagnosis not present

## 2022-07-21 DIAGNOSIS — N184 Chronic kidney disease, stage 4 (severe): Secondary | ICD-10-CM | POA: Diagnosis not present

## 2022-07-21 DIAGNOSIS — H532 Diplopia: Secondary | ICD-10-CM | POA: Diagnosis not present

## 2022-07-21 DIAGNOSIS — Z23 Encounter for immunization: Secondary | ICD-10-CM | POA: Diagnosis not present

## 2022-07-21 DIAGNOSIS — M150 Primary generalized (osteo)arthritis: Secondary | ICD-10-CM | POA: Diagnosis not present

## 2022-07-21 DIAGNOSIS — I129 Hypertensive chronic kidney disease with stage 1 through stage 4 chronic kidney disease, or unspecified chronic kidney disease: Secondary | ICD-10-CM | POA: Diagnosis not present

## 2022-07-21 DIAGNOSIS — E1122 Type 2 diabetes mellitus with diabetic chronic kidney disease: Secondary | ICD-10-CM | POA: Diagnosis not present

## 2022-07-21 NOTE — Telephone Encounter (Signed)
lvm to cb to r/s appt for 10-30 or 10-31 [place] oof  

## 2022-07-23 ENCOUNTER — Encounter (HOSPITAL_COMMUNITY): Payer: Self-pay

## 2022-07-23 ENCOUNTER — Encounter (HOSPITAL_COMMUNITY)
Admission: RE | Admit: 2022-07-23 | Discharge: 2022-07-23 | Disposition: A | Discharge status: Home / Self Care | Payer: Medicare PPO | Source: Ambulatory Visit | Attending: Nephrology | Admitting: Nephrology

## 2022-07-23 VITALS — BP 153/62 | HR 75 | Temp 98.2°F | Resp 20

## 2022-07-23 DIAGNOSIS — D631 Anemia in chronic kidney disease: Secondary | ICD-10-CM | POA: Diagnosis not present

## 2022-07-23 DIAGNOSIS — E119 Type 2 diabetes mellitus without complications: Secondary | ICD-10-CM | POA: Diagnosis not present

## 2022-07-23 DIAGNOSIS — N184 Chronic kidney disease, stage 4 (severe): Secondary | ICD-10-CM

## 2022-07-23 LAB — HEMOGLOBIN A1C
Hgb A1c MFr Bld: 6.4 % — ABNORMAL HIGH (ref 4.8–5.6)
Mean Plasma Glucose: 136.98 mg/dL

## 2022-07-23 LAB — LIPID PANEL
Cholesterol: 173 mg/dL (ref 0–200)
HDL: 35 mg/dL — ABNORMAL LOW (ref >40)
LDL Cholesterol: 98 mg/dL (ref 0–99)
Total CHOL/HDL Ratio: 4.9 RATIO
Triglycerides: 200 mg/dL — ABNORMAL HIGH (ref <150)
VLDL: 40 mg/dL (ref 0–40)

## 2022-07-23 MED ORDER — EPOETIN ALFA-EPBX 10000 UNIT/ML IJ SOLN
8000.0000 [IU] | Freq: Once | INTRAMUSCULAR | Status: AC
  Administered 2022-07-23: 8000 [IU] via SUBCUTANEOUS

## 2022-07-23 MED ORDER — EPOETIN ALFA-EPBX 10000 UNIT/ML IJ SOLN
INTRAMUSCULAR | Status: AC
  Filled 2022-07-23: qty 1

## 2022-07-24 DIAGNOSIS — M62552 Muscle wasting and atrophy, not elsewhere classified, left thigh: Secondary | ICD-10-CM | POA: Diagnosis not present

## 2022-07-24 DIAGNOSIS — M62551 Muscle wasting and atrophy, not elsewhere classified, right thigh: Secondary | ICD-10-CM | POA: Diagnosis not present

## 2022-07-24 DIAGNOSIS — R269 Unspecified abnormalities of gait and mobility: Secondary | ICD-10-CM | POA: Diagnosis not present

## 2022-07-24 DIAGNOSIS — M545 Low back pain, unspecified: Secondary | ICD-10-CM | POA: Diagnosis not present

## 2022-07-24 DIAGNOSIS — M25551 Pain in right hip: Secondary | ICD-10-CM | POA: Diagnosis not present

## 2022-07-28 NOTE — Progress Notes (Signed)
Latest Reference Range & Units 07/09/22 14:28 07/23/22 14:21  Total CHOL/HDL Ratio RATIO  4.9  Cholesterol 0 - 200 mg/dL  173  HDL Cholesterol >40 mg/dL  35 (L)  LDL (calc) 0 - 99 mg/dL  98  Triglycerides <150 mg/dL  200 (H)  VLDL 0 - 40 mg/dL  40  Hemoglobin 12.0 - 15.0 g/dL 9.4 (L)   Hemoglobin A1C 4.8 - 5.6 %  6.4 (H)  (L): Data is abnormally low (H): Data is abnormally high

## 2022-07-29 ENCOUNTER — Ambulatory Visit: Payer: Medicare PPO | Admitting: Podiatry

## 2022-07-29 LAB — POCT HEMOGLOBIN-HEMACUE: Hemoglobin: 8.5 g/dL — ABNORMAL LOW (ref 12.0–15.0)

## 2022-07-31 DIAGNOSIS — R269 Unspecified abnormalities of gait and mobility: Secondary | ICD-10-CM | POA: Diagnosis not present

## 2022-07-31 DIAGNOSIS — M62552 Muscle wasting and atrophy, not elsewhere classified, left thigh: Secondary | ICD-10-CM | POA: Diagnosis not present

## 2022-07-31 DIAGNOSIS — M62551 Muscle wasting and atrophy, not elsewhere classified, right thigh: Secondary | ICD-10-CM | POA: Diagnosis not present

## 2022-07-31 DIAGNOSIS — M25551 Pain in right hip: Secondary | ICD-10-CM | POA: Diagnosis not present

## 2022-07-31 DIAGNOSIS — M545 Low back pain, unspecified: Secondary | ICD-10-CM | POA: Diagnosis not present

## 2022-08-04 ENCOUNTER — Ambulatory Visit: Payer: Medicare PPO | Admitting: Podiatry

## 2022-08-04 ENCOUNTER — Encounter: Payer: Self-pay | Admitting: Podiatry

## 2022-08-04 DIAGNOSIS — L84 Corns and callosities: Secondary | ICD-10-CM

## 2022-08-04 DIAGNOSIS — B351 Tinea unguium: Secondary | ICD-10-CM | POA: Diagnosis not present

## 2022-08-04 DIAGNOSIS — M79674 Pain in right toe(s): Secondary | ICD-10-CM | POA: Diagnosis not present

## 2022-08-04 DIAGNOSIS — E1121 Type 2 diabetes mellitus with diabetic nephropathy: Secondary | ICD-10-CM

## 2022-08-04 DIAGNOSIS — M79675 Pain in left toe(s): Secondary | ICD-10-CM | POA: Diagnosis not present

## 2022-08-04 DIAGNOSIS — Z794 Long term (current) use of insulin: Secondary | ICD-10-CM

## 2022-08-06 ENCOUNTER — Encounter (HOSPITAL_COMMUNITY)
Admission: RE | Admit: 2022-08-06 | Discharge: 2022-08-06 | Disposition: A | Discharge status: Home / Self Care | Payer: Medicare PPO | Source: Ambulatory Visit | Attending: Nephrology | Admitting: Nephrology

## 2022-08-06 VITALS — BP 146/61 | HR 64 | Temp 98.3°F | Resp 18

## 2022-08-06 DIAGNOSIS — D631 Anemia in chronic kidney disease: Secondary | ICD-10-CM | POA: Insufficient documentation

## 2022-08-06 DIAGNOSIS — N184 Chronic kidney disease, stage 4 (severe): Secondary | ICD-10-CM | POA: Insufficient documentation

## 2022-08-06 LAB — IRON AND TIBC
Iron: 65 ug/dL (ref 28–170)
Saturation Ratios: 20 % (ref 10.4–31.8)
TIBC: 331 ug/dL (ref 250–450)
UIBC: 266 ug/dL

## 2022-08-06 LAB — CBC WITH DIFFERENTIAL/PLATELET
Abs Immature Granulocytes: 0.02 10*3/uL (ref 0.00–0.07)
Basophils Absolute: 0 10*3/uL (ref 0.0–0.1)
Basophils Relative: 1 %
Eosinophils Absolute: 0.1 10*3/uL (ref 0.0–0.5)
Eosinophils Relative: 3 %
HCT: 28.6 % — ABNORMAL LOW (ref 36.0–46.0)
Hemoglobin: 8.8 g/dL — ABNORMAL LOW (ref 12.0–15.0)
Immature Granulocytes: 1 %
Lymphocytes Relative: 24 %
Lymphs Abs: 1 10*3/uL (ref 0.7–4.0)
MCH: 30.8 pg (ref 26.0–34.0)
MCHC: 30.8 g/dL (ref 30.0–36.0)
MCV: 100 fL (ref 80.0–100.0)
Monocytes Absolute: 0.3 10*3/uL (ref 0.1–1.0)
Monocytes Relative: 8 %
Neutro Abs: 2.7 10*3/uL (ref 1.7–7.7)
Neutrophils Relative %: 63 %
Platelets: 233 10*3/uL (ref 150–400)
RBC: 2.86 MIL/uL — ABNORMAL LOW (ref 3.87–5.11)
RDW: 15.2 % (ref 11.5–15.5)
WBC: 4.3 10*3/uL (ref 4.0–10.5)
nRBC: 0 % (ref 0.0–0.2)

## 2022-08-06 LAB — RENAL FUNCTION PANEL
Albumin: 4.3 g/dL (ref 3.5–5.0)
Anion gap: 12 (ref 5–15)
BUN: 63 mg/dL — ABNORMAL HIGH (ref 8–23)
CO2: 25 mmol/L (ref 22–32)
Calcium: 9.3 mg/dL (ref 8.9–10.3)
Chloride: 101 mmol/L (ref 98–111)
Creatinine, Ser: 3.13 mg/dL — ABNORMAL HIGH (ref 0.44–1.00)
GFR, Estimated: 14 mL/min — ABNORMAL LOW (ref >60)
Glucose, Bld: 167 mg/dL — ABNORMAL HIGH (ref 70–99)
Phosphorus: 4.4 mg/dL (ref 2.5–4.6)
Potassium: 4.4 mmol/L (ref 3.5–5.1)
Sodium: 138 mmol/L (ref 135–145)

## 2022-08-06 LAB — PROTEIN / CREATININE RATIO, URINE
Creatinine, Urine: 147.85 mg/dL
Protein Creatinine Ratio: 0.74 mg/mg{Cre} — ABNORMAL HIGH (ref 0.00–0.15)
Total Protein, Urine: 109 mg/dL

## 2022-08-06 LAB — FERRITIN: Ferritin: 659 ng/mL — ABNORMAL HIGH (ref 11–307)

## 2022-08-06 LAB — POCT HEMOGLOBIN-HEMACUE: Hemoglobin: 9 g/dL — ABNORMAL LOW (ref 12.0–15.0)

## 2022-08-06 MED ORDER — EPOETIN ALFA-EPBX 10000 UNIT/ML IJ SOLN
8000.0000 [IU] | Freq: Once | INTRAMUSCULAR | Status: AC
  Administered 2022-08-06: 8000 [IU] via SUBCUTANEOUS
  Filled 2022-08-06: qty 1

## 2022-08-07 DIAGNOSIS — M545 Low back pain, unspecified: Secondary | ICD-10-CM | POA: Diagnosis not present

## 2022-08-07 DIAGNOSIS — R269 Unspecified abnormalities of gait and mobility: Secondary | ICD-10-CM | POA: Diagnosis not present

## 2022-08-07 DIAGNOSIS — M25551 Pain in right hip: Secondary | ICD-10-CM | POA: Diagnosis not present

## 2022-08-07 DIAGNOSIS — M62552 Muscle wasting and atrophy, not elsewhere classified, left thigh: Secondary | ICD-10-CM | POA: Diagnosis not present

## 2022-08-07 DIAGNOSIS — M62551 Muscle wasting and atrophy, not elsewhere classified, right thigh: Secondary | ICD-10-CM | POA: Diagnosis not present

## 2022-08-07 LAB — PTH, INTACT AND CALCIUM
Calcium, Total (PTH): 9.3 mg/dL (ref 8.7–10.3)
PTH: 114 pg/mL — ABNORMAL HIGH (ref 15–65)

## 2022-08-08 IMAGING — DX DG FOOT COMPLETE 3+V*L*
3 series · 3 of 3 positions shown · non-contrast
Comparison: None Available.

CLINICAL DATA: Left fifth toe pain after injury 3 weeks ago

EXAM:
LEFT FOOT - COMPLETE 3+ VIEW

[foot ap]
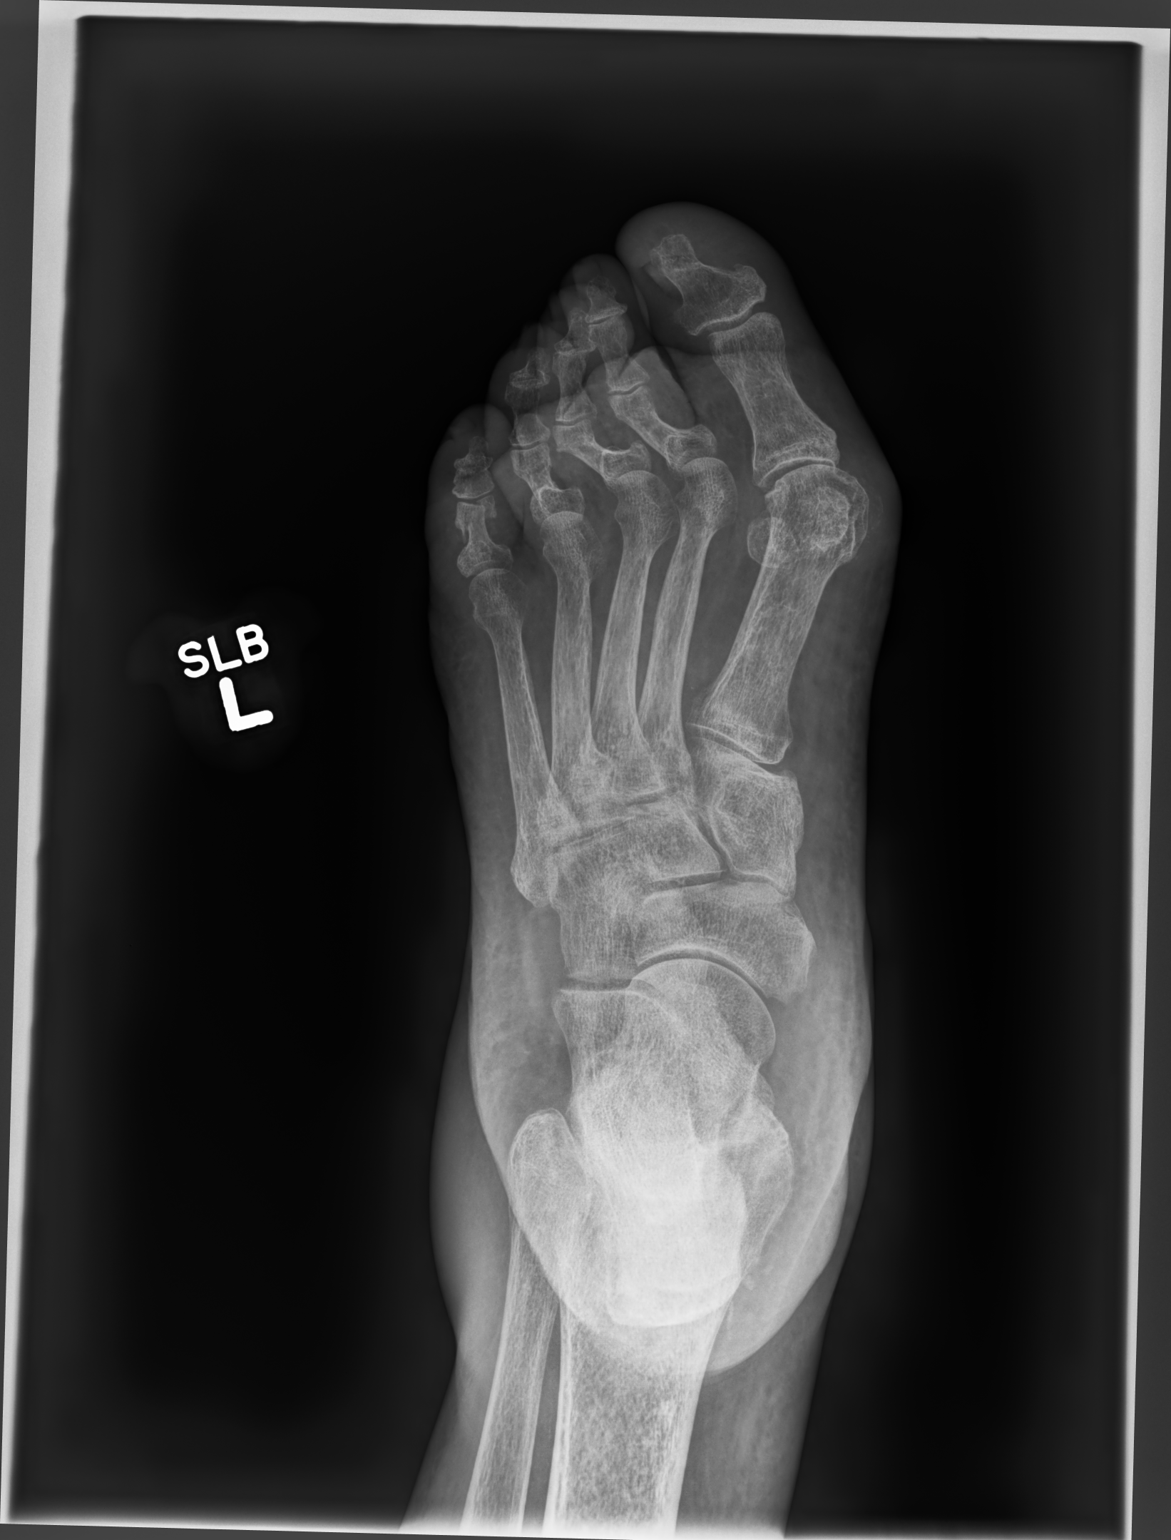

[foot mlo]
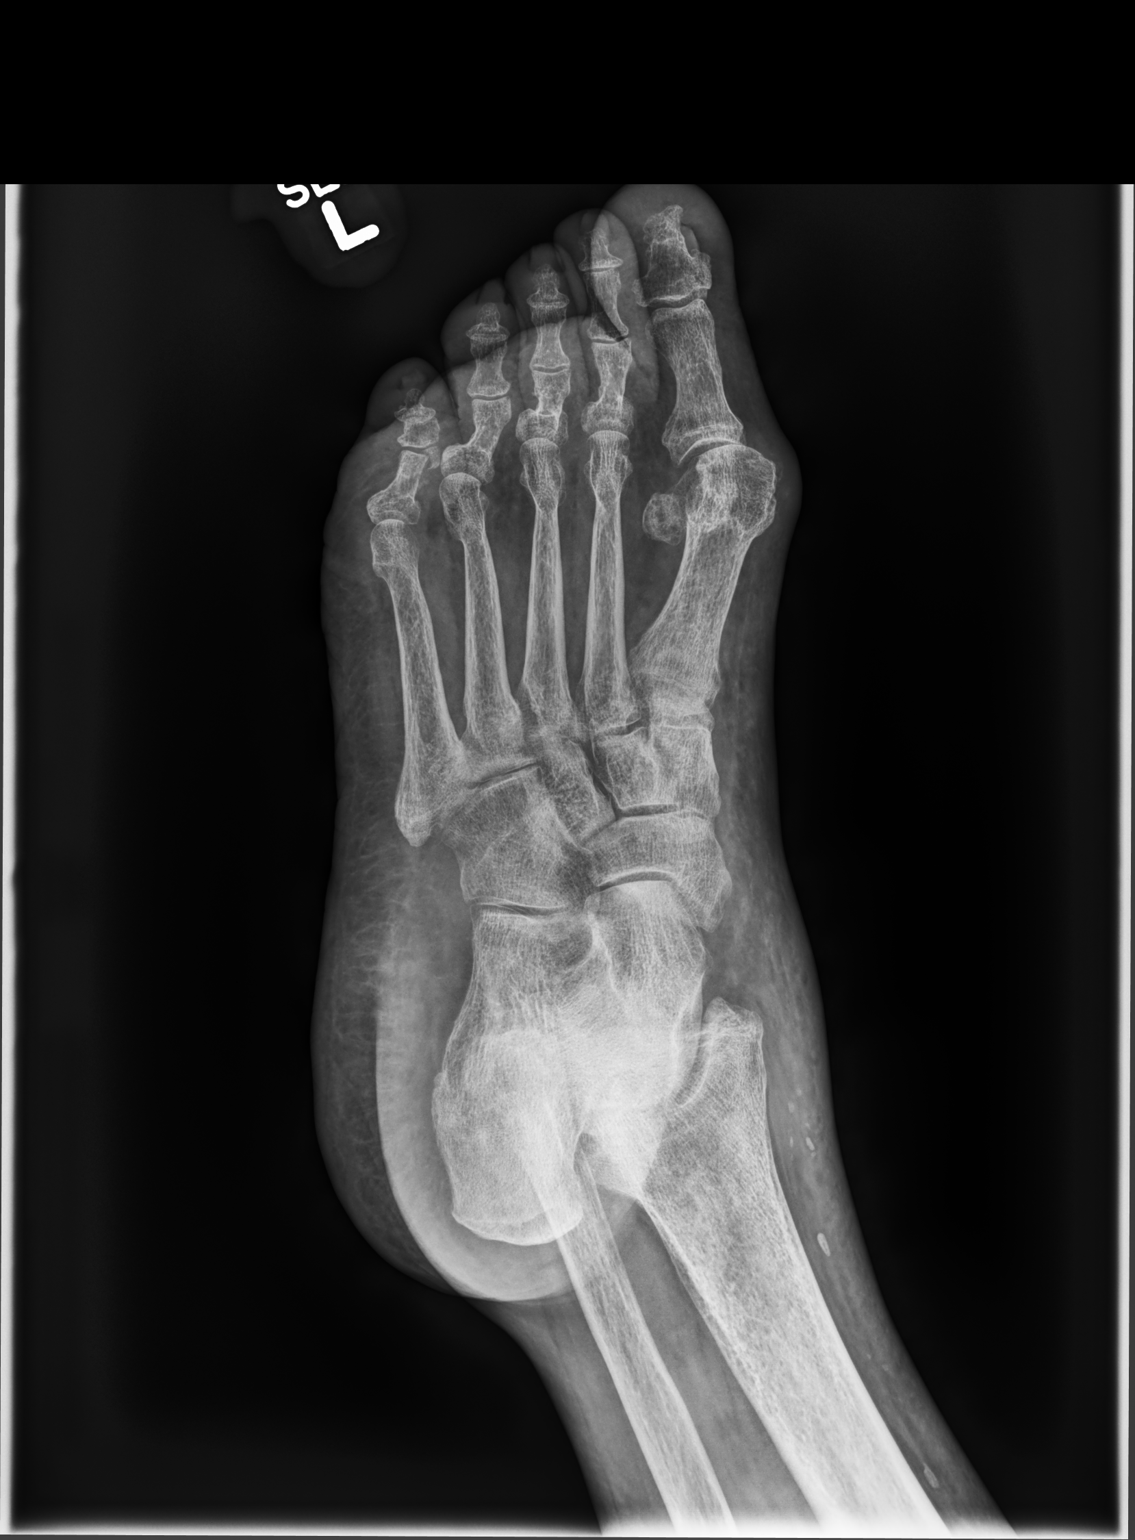

[foot lat]
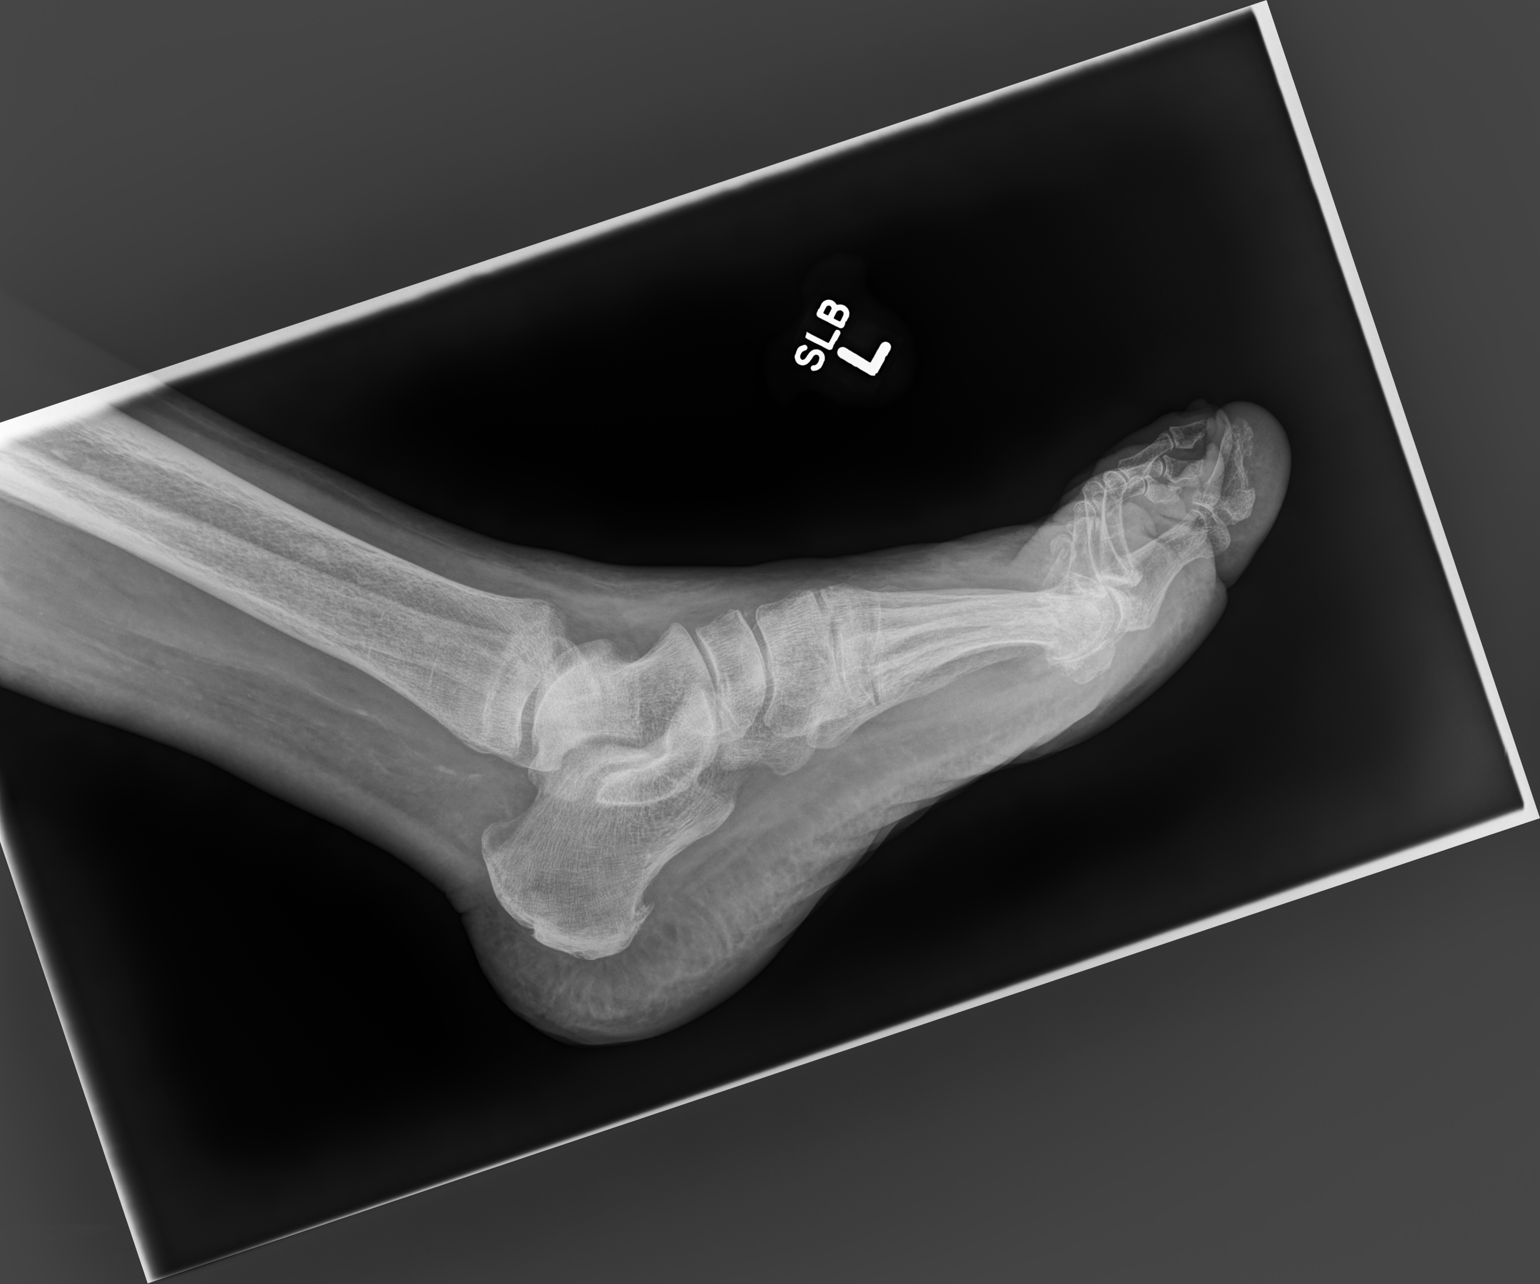

[3 of 3 positions shown; findings below may reference images not displayed]

FINDINGS: Acute comminuted fracture of the proximal phalanx of the fifth toe
with intra-articular extension to the interphalangeal joint. No
dislocation. No additional fractures. Hallux valgus deformity.
Moderate osteoarthritis of the first MTP joint. Mild degenerative
changes elsewhere. Bones appear demineralized. Mild diffuse soft
tissue swelling.
IMPRESSION: Acute comminuted fracture of the proximal phalanx of the fifth toe
with intra-articular extension to the interphalangeal joint.

## 2022-08-09 NOTE — Progress Notes (Signed)
  Subjective:  Patient ID: Stephanie Middleton, female    DOB: [birth year],  MRN: [id number]  Stephanie Middleton presents to clinic today for at risk foot care. Pt has h/o NIDDM with chronic kidney disease and painful elongated mycotic toenails 1-5 bilaterally which are tender when wearing enclosed shoe gear. Pain is relieved with periodic professional debridement.  Chief Complaint  Patient presents with   Nail Problem    Dfc BG - 108  A1c - 6.4 PCP - Dr Berdine Addison    New problem(s): None.   PCP is Iona Beard, MD , and last visit was April 20, 2022.  No Known Allergies  Review of Systems: Negative except as noted in the HPI.  Objective:   Stephanie Middleton is a pleasant 88 y.o. female WD, WN in NAD. AAO x 3. Vascular Examination: CFT <3 seconds b/l LE. Palpable DP/PT pulses b/l LE. Digital hair sparse b/l. Skin temperature gradient WNL b/l. No pain with calf compression b/l. Trace edema noted b/l. No cyanosis or clubbing noted b/l LE.  Dermatological Examination: Pedal integument with normal turgor, texture and tone b/l LE. No open wounds b/l. No interdigital macerations b/l. Toenails 1-5 b/l elongated, thickened, discolored with subungual debris. +Tenderness with dorsal palpation of nailplates.   Hyperkeratotic lesion(s) distal tip of left great toe.  No erythema, no edema, no drainage, no fluctuance.  Musculoskeletal Examination: Normal muscle strength 5/5 to all lower extremity muscle groups bilaterally. HAV with bunion deformity noted b/l LE. Hammertoe deformity noted 2-5 b/l. No pain, crepitus or joint limitation noted with ROM b/l LE.  Patient ambulates with cane assistance.  Neurological Examination: Protective sensation intact 5/5 intact bilaterally with 10g monofilament b/l.  Assessment/Plan: 1. Pain due to onychomycosis of toenails of both feet   2. Callus   3. Type 2 diabetes mellitus with diabetic nephropathy, with long-term current use of insulin (HCC)     No orders of the  defined types were placed in this encounter.   -Consent given for treatment as described below: -Examined patient. -Continue supportive shoe gear daily. -Toenails 1-5 b/l were debrided in length and girth with sterile nail nippers and dremel without iatrogenic bleeding.  -Callus(es) distal tip of left great toe pared utilizing sterile scalpel blade without complication or incident. Total number debrided =1. -Patient/POA to call should there be question/concern in the interim.   Return in about 3 months (around 11/04/2022).  Marzetta Board, DPM

## 2022-08-14 DIAGNOSIS — M62551 Muscle wasting and atrophy, not elsewhere classified, right thigh: Secondary | ICD-10-CM | POA: Diagnosis not present

## 2022-08-14 DIAGNOSIS — E1129 Type 2 diabetes mellitus with other diabetic kidney complication: Secondary | ICD-10-CM | POA: Diagnosis not present

## 2022-08-14 DIAGNOSIS — E1122 Type 2 diabetes mellitus with diabetic chronic kidney disease: Secondary | ICD-10-CM | POA: Diagnosis not present

## 2022-08-14 DIAGNOSIS — M25551 Pain in right hip: Secondary | ICD-10-CM | POA: Diagnosis not present

## 2022-08-14 DIAGNOSIS — R809 Proteinuria, unspecified: Secondary | ICD-10-CM | POA: Diagnosis not present

## 2022-08-14 DIAGNOSIS — R600 Localized edema: Secondary | ICD-10-CM | POA: Diagnosis not present

## 2022-08-14 DIAGNOSIS — N189 Chronic kidney disease, unspecified: Secondary | ICD-10-CM | POA: Diagnosis not present

## 2022-08-14 DIAGNOSIS — E211 Secondary hyperparathyroidism, not elsewhere classified: Secondary | ICD-10-CM | POA: Diagnosis not present

## 2022-08-14 DIAGNOSIS — M545 Low back pain, unspecified: Secondary | ICD-10-CM | POA: Diagnosis not present

## 2022-08-14 DIAGNOSIS — I129 Hypertensive chronic kidney disease with stage 1 through stage 4 chronic kidney disease, or unspecified chronic kidney disease: Secondary | ICD-10-CM | POA: Diagnosis not present

## 2022-08-14 DIAGNOSIS — N185 Chronic kidney disease, stage 5: Secondary | ICD-10-CM | POA: Diagnosis not present

## 2022-08-14 DIAGNOSIS — M62552 Muscle wasting and atrophy, not elsewhere classified, left thigh: Secondary | ICD-10-CM | POA: Diagnosis not present

## 2022-08-14 DIAGNOSIS — R269 Unspecified abnormalities of gait and mobility: Secondary | ICD-10-CM | POA: Diagnosis not present

## 2022-08-14 DIAGNOSIS — D631 Anemia in chronic kidney disease: Secondary | ICD-10-CM | POA: Diagnosis not present

## 2022-08-17 ENCOUNTER — Encounter: Payer: Self-pay | Admitting: Orthopedic Surgery

## 2022-08-17 ENCOUNTER — Ambulatory Visit: Payer: Medicare PPO | Admitting: Orthopedic Surgery

## 2022-08-17 DIAGNOSIS — M48062 Spinal stenosis, lumbar region with neurogenic claudication: Secondary | ICD-10-CM | POA: Diagnosis not present

## 2022-08-17 NOTE — Progress Notes (Signed)
Chief Complaint  Patient presents with   Back Pain    Feels better has gone for therapy     Stephanie Middleton has had physical therapy set says her back feels better.  She is trying to do home exercises and progressing slowly but progressing  She is agreeable to proceed with home exercises with once a week follow-up for 3-4 more visits  She is adamant about not having injections or surgery  Her MRI findings are noted below    L3-4: Disc desiccation. Disc bulging, a left foraminal disc protrusion, and moderate facet and ligamentum flavum hypertrophy result in mild-to-moderate left neural foraminal stenosis without spinal stenosis, unchanged.   L4-5: Disc desiccation and severe disc space narrowing. Possible prior right laminotomy. Anterolisthesis with bulging uncovered disc, left ligamentum flavum hypertrophy, and severe facet arthrosis result in severe spinal stenosis and severe right and mild left neural foraminal stenosis, overall mildly progressed.   L5-S1: Prior right laminotomy. Minimal disc bulging and mild facet and ligamentum flavum hypertrophy result in borderline to mild left neural foraminal stenosis without spinal stenosis, unchanged.   IMPRESSION: 1. Severe L4-5 disc and facet degeneration with grade 2 anterolisthesis, severe spinal stenosis, and severe right neural foraminal stenosis, mildly progressed from 2017. 2. Unchanged disc and facet degeneration elsewhere as above.     Electronically Signed   By: Logan Bores M.D.   On: 06/02/2022 15:24   HIGH RISK FOR PAIN AGAIN CURRENTLY STABLE

## 2022-08-17 NOTE — Patient Instructions (Signed)
DO YOUR EXERCISES AT HOME   TAKE TYLENOL 500 MG EVERY 6 HRS AS NEEDED FOR PAIN   USE HEATING PAD AS NEEDED

## 2022-08-20 ENCOUNTER — Encounter (HOSPITAL_COMMUNITY)
Admission: RE | Admit: 2022-08-20 | Discharge: 2022-08-20 | Disposition: A | Discharge status: Home / Self Care | Payer: Medicare PPO | Source: Ambulatory Visit | Attending: Nephrology | Admitting: Nephrology

## 2022-08-20 VITALS — BP 145/64 | HR 68 | Temp 98.5°F | Resp 16 | Ht 62.0 in | Wt 172.2 lb

## 2022-08-20 DIAGNOSIS — N184 Chronic kidney disease, stage 4 (severe): Secondary | ICD-10-CM | POA: Diagnosis not present

## 2022-08-20 DIAGNOSIS — D631 Anemia in chronic kidney disease: Secondary | ICD-10-CM | POA: Diagnosis not present

## 2022-08-20 LAB — POCT HEMOGLOBIN-HEMACUE: Hemoglobin: 8.9 g/dL — ABNORMAL LOW (ref 12.0–15.0)

## 2022-08-20 MED ORDER — EPOETIN ALFA-EPBX 10000 UNIT/ML IJ SOLN
INTRAMUSCULAR | Status: AC
  Filled 2022-08-20: qty 1

## 2022-08-20 MED ORDER — EPOETIN ALFA-EPBX 10000 UNIT/ML IJ SOLN
8000.0000 [IU] | Freq: Once | INTRAMUSCULAR | Status: AC
  Administered 2022-08-20: 8000 [IU] via SUBCUTANEOUS

## 2022-08-21 DIAGNOSIS — M62551 Muscle wasting and atrophy, not elsewhere classified, right thigh: Secondary | ICD-10-CM | POA: Diagnosis not present

## 2022-08-21 DIAGNOSIS — M545 Low back pain, unspecified: Secondary | ICD-10-CM | POA: Diagnosis not present

## 2022-08-21 DIAGNOSIS — R269 Unspecified abnormalities of gait and mobility: Secondary | ICD-10-CM | POA: Diagnosis not present

## 2022-08-21 DIAGNOSIS — M62552 Muscle wasting and atrophy, not elsewhere classified, left thigh: Secondary | ICD-10-CM | POA: Diagnosis not present

## 2022-08-21 DIAGNOSIS — M25551 Pain in right hip: Secondary | ICD-10-CM | POA: Diagnosis not present

## 2022-08-26 DIAGNOSIS — M25551 Pain in right hip: Secondary | ICD-10-CM | POA: Diagnosis not present

## 2022-08-26 DIAGNOSIS — R269 Unspecified abnormalities of gait and mobility: Secondary | ICD-10-CM | POA: Diagnosis not present

## 2022-08-26 DIAGNOSIS — M62551 Muscle wasting and atrophy, not elsewhere classified, right thigh: Secondary | ICD-10-CM | POA: Diagnosis not present

## 2022-08-26 DIAGNOSIS — M62552 Muscle wasting and atrophy, not elsewhere classified, left thigh: Secondary | ICD-10-CM | POA: Diagnosis not present

## 2022-08-26 DIAGNOSIS — M545 Low back pain, unspecified: Secondary | ICD-10-CM | POA: Diagnosis not present

## 2022-09-03 ENCOUNTER — Encounter (HOSPITAL_COMMUNITY)
Admission: RE | Admit: 2022-09-03 | Discharge: 2022-09-03 | Disposition: A | Discharge status: Home / Self Care | Payer: Medicare PPO | Source: Ambulatory Visit | Attending: Nephrology | Admitting: Nephrology

## 2022-09-03 VITALS — BP 143/65 | HR 70 | Temp 98.4°F | Resp 18

## 2022-09-03 DIAGNOSIS — N184 Chronic kidney disease, stage 4 (severe): Secondary | ICD-10-CM | POA: Diagnosis not present

## 2022-09-03 DIAGNOSIS — D631 Anemia in chronic kidney disease: Secondary | ICD-10-CM

## 2022-09-03 LAB — POCT HEMOGLOBIN-HEMACUE: Hemoglobin: 8.4 g/dL — ABNORMAL LOW (ref 12.0–15.0)

## 2022-09-03 MED ORDER — EPOETIN ALFA-EPBX 10000 UNIT/ML IJ SOLN
8000.0000 [IU] | Freq: Once | INTRAMUSCULAR | Status: AC
  Administered 2022-09-03: 8000 [IU] via SUBCUTANEOUS
  Filled 2022-09-03: qty 1

## 2022-09-03 NOTE — Progress Notes (Signed)
Diagnosis: Anemia in Chronic Kidney Disease  Provider:  Manpreet Bhutani MD  Procedure: Injection  Retacrit (epoetin alfa-epbx), 8000U, Site: subcutaneous, Number of injections: 1  Post Care: Patient declined observation  Discharge: Condition: Good, Destination: Home . AVS provided to patient.   Performed by:  Jonelle Sidle, RN

## 2022-09-04 DIAGNOSIS — R269 Unspecified abnormalities of gait and mobility: Secondary | ICD-10-CM | POA: Diagnosis not present

## 2022-09-04 DIAGNOSIS — M25551 Pain in right hip: Secondary | ICD-10-CM | POA: Diagnosis not present

## 2022-09-04 DIAGNOSIS — M62552 Muscle wasting and atrophy, not elsewhere classified, left thigh: Secondary | ICD-10-CM | POA: Diagnosis not present

## 2022-09-04 DIAGNOSIS — M545 Low back pain, unspecified: Secondary | ICD-10-CM | POA: Diagnosis not present

## 2022-09-04 DIAGNOSIS — M62551 Muscle wasting and atrophy, not elsewhere classified, right thigh: Secondary | ICD-10-CM | POA: Diagnosis not present

## 2022-09-07 DIAGNOSIS — E1122 Type 2 diabetes mellitus with diabetic chronic kidney disease: Secondary | ICD-10-CM | POA: Diagnosis not present

## 2022-09-11 DIAGNOSIS — M62551 Muscle wasting and atrophy, not elsewhere classified, right thigh: Secondary | ICD-10-CM | POA: Diagnosis not present

## 2022-09-11 DIAGNOSIS — M62552 Muscle wasting and atrophy, not elsewhere classified, left thigh: Secondary | ICD-10-CM | POA: Diagnosis not present

## 2022-09-11 DIAGNOSIS — R269 Unspecified abnormalities of gait and mobility: Secondary | ICD-10-CM | POA: Diagnosis not present

## 2022-09-11 DIAGNOSIS — M25551 Pain in right hip: Secondary | ICD-10-CM | POA: Diagnosis not present

## 2022-09-11 DIAGNOSIS — M545 Low back pain, unspecified: Secondary | ICD-10-CM | POA: Diagnosis not present

## 2022-09-17 ENCOUNTER — Encounter (HOSPITAL_COMMUNITY)
Admission: RE | Admit: 2022-09-17 | Discharge: 2022-09-17 | Disposition: A | Discharge status: Home / Self Care | Payer: Medicare PPO | Source: Ambulatory Visit | Attending: Nephrology | Admitting: Nephrology

## 2022-09-17 VITALS — BP 168/73 | HR 66 | Temp 98.4°F | Resp 16

## 2022-09-17 DIAGNOSIS — E785 Hyperlipidemia, unspecified: Secondary | ICD-10-CM | POA: Diagnosis not present

## 2022-09-17 DIAGNOSIS — M109 Gout, unspecified: Secondary | ICD-10-CM | POA: Diagnosis not present

## 2022-09-17 DIAGNOSIS — D631 Anemia in chronic kidney disease: Secondary | ICD-10-CM | POA: Insufficient documentation

## 2022-09-17 DIAGNOSIS — K219 Gastro-esophageal reflux disease without esophagitis: Secondary | ICD-10-CM | POA: Diagnosis not present

## 2022-09-17 DIAGNOSIS — E1122 Type 2 diabetes mellitus with diabetic chronic kidney disease: Secondary | ICD-10-CM | POA: Diagnosis not present

## 2022-09-17 DIAGNOSIS — N184 Chronic kidney disease, stage 4 (severe): Secondary | ICD-10-CM | POA: Diagnosis not present

## 2022-09-17 DIAGNOSIS — Z794 Long term (current) use of insulin: Secondary | ICD-10-CM | POA: Diagnosis not present

## 2022-09-17 DIAGNOSIS — E669 Obesity, unspecified: Secondary | ICD-10-CM | POA: Diagnosis not present

## 2022-09-17 DIAGNOSIS — M1990 Unspecified osteoarthritis, unspecified site: Secondary | ICD-10-CM | POA: Diagnosis not present

## 2022-09-17 DIAGNOSIS — R32 Unspecified urinary incontinence: Secondary | ICD-10-CM | POA: Diagnosis not present

## 2022-09-17 DIAGNOSIS — Z7982 Long term (current) use of aspirin: Secondary | ICD-10-CM | POA: Diagnosis not present

## 2022-09-17 LAB — POCT HEMOGLOBIN-HEMACUE: Hemoglobin: 9.7 g/dL — ABNORMAL LOW (ref 12.0–15.0)

## 2022-09-17 MED ORDER — EPOETIN ALFA-EPBX 10000 UNIT/ML IJ SOLN
8000.0000 [IU] | Freq: Once | INTRAMUSCULAR | Status: AC
  Administered 2022-09-17: 8000 [IU] via SUBCUTANEOUS

## 2022-09-17 NOTE — Progress Notes (Signed)
Diagnosis: Anemia in Chronic Kidney Disease  Provider:  Manpreet Bhutani MD  Procedure: Injection  Retacrit (epoetin alfa-epbx), Dose: 8000 Units, Site: subcutaneous, Number of injections: 1  Post Care: Observation period completed  Discharge: Condition: Good, Destination: Home . AVS provided to patient.   Performed by:  Oren Beckmann, RN

## 2022-09-17 NOTE — Addendum Note (Signed)
Encounter addended by: Baxter Hire, RN on: 09/17/2022 10:41 AM  Actions taken: Therapy plan modified

## 2022-10-01 ENCOUNTER — Encounter (HOSPITAL_COMMUNITY)
Admission: RE | Admit: 2022-10-01 | Discharge: 2022-10-01 | Disposition: A | Discharge status: Home / Self Care | Payer: Medicare PPO | Source: Ambulatory Visit | Attending: Nephrology | Admitting: Nephrology

## 2022-10-01 DIAGNOSIS — D631 Anemia in chronic kidney disease: Secondary | ICD-10-CM

## 2022-10-01 DIAGNOSIS — N184 Chronic kidney disease, stage 4 (severe): Secondary | ICD-10-CM | POA: Diagnosis not present

## 2022-10-01 LAB — POCT HEMOGLOBIN-HEMACUE: Hemoglobin: 9 g/dL — ABNORMAL LOW (ref 12.0–15.0)

## 2022-10-01 MED ORDER — EPOETIN ALFA-EPBX 10000 UNIT/ML IJ SOLN
8000.0000 [IU] | Freq: Once | INTRAMUSCULAR | Status: AC
  Administered 2022-10-01: 8000 [IU] via SUBCUTANEOUS
  Filled 2022-10-01: qty 1

## 2022-10-01 NOTE — Progress Notes (Signed)
Diagnosis: Anemia in Chronic Kidney Disease  Provider:  Manpreet Bhutani MD  Procedure: Injection  Retacrit (epoetin alfa-epbx), Dose: 8000 Units, Site: subcutaneous, Number of injections: 1  Hgb 9.0  Post Care: Patient declined observation  Discharge: Condition: Good, Destination: Home . AVS provided to patient.   Performed by:  Binnie Kand, RN

## 2022-10-01 NOTE — Addendum Note (Signed)
Encounter addended by: Mirta Mally E, RN on: 10/01/2022 1:58 PM  Actions taken: Therapy plan modified

## 2022-10-15 ENCOUNTER — Encounter (HOSPITAL_COMMUNITY)
Admission: RE | Admit: 2022-10-15 | Discharge: 2022-10-15 | Disposition: A | Discharge status: Home / Self Care | Payer: Medicare PPO | Source: Ambulatory Visit | Attending: Nephrology | Admitting: Nephrology

## 2022-10-15 VITALS — BP 147/67 | HR 65 | Temp 98.4°F | Resp 16

## 2022-10-15 DIAGNOSIS — N184 Chronic kidney disease, stage 4 (severe): Secondary | ICD-10-CM | POA: Insufficient documentation

## 2022-10-15 DIAGNOSIS — D631 Anemia in chronic kidney disease: Secondary | ICD-10-CM | POA: Diagnosis not present

## 2022-10-15 DIAGNOSIS — N189 Chronic kidney disease, unspecified: Secondary | ICD-10-CM | POA: Diagnosis present

## 2022-10-15 LAB — POCT HEMOGLOBIN-HEMACUE: Hemoglobin: 8 g/dL — ABNORMAL LOW (ref 12.0–15.0)

## 2022-10-15 MED ORDER — EPOETIN ALFA-EPBX 10000 UNIT/ML IJ SOLN
8000.0000 [IU] | Freq: Once | INTRAMUSCULAR | Status: AC
  Administered 2022-10-15: 8000 [IU] via SUBCUTANEOUS
  Filled 2022-10-15: qty 1

## 2022-10-15 NOTE — Progress Notes (Signed)
Diagnosis: Anemia in Chronic Kidney Disease  Provider:  Manpreet Bhutani MD  Procedure: Injection  Retacrit (epoetin alfa-epbx), Dose: 8000 Units, Site: subcutaneous, Number of injections: 1  HGB 8.0  Post Care: Patient declined observation  Discharge: Condition: Good, Destination: Home . AVS provided to patient.   Performed by:  Baxter Hire, RN

## 2022-10-27 DIAGNOSIS — I129 Hypertensive chronic kidney disease with stage 1 through stage 4 chronic kidney disease, or unspecified chronic kidney disease: Secondary | ICD-10-CM | POA: Diagnosis not present

## 2022-10-27 DIAGNOSIS — N184 Chronic kidney disease, stage 4 (severe): Secondary | ICD-10-CM | POA: Diagnosis not present

## 2022-10-27 DIAGNOSIS — M150 Primary generalized (osteo)arthritis: Secondary | ICD-10-CM | POA: Diagnosis not present

## 2022-10-27 DIAGNOSIS — E1122 Type 2 diabetes mellitus with diabetic chronic kidney disease: Secondary | ICD-10-CM | POA: Diagnosis not present

## 2022-10-29 ENCOUNTER — Encounter (HOSPITAL_COMMUNITY)
Admission: RE | Admit: 2022-10-29 | Discharge: 2022-10-29 | Disposition: A | Discharge status: Home / Self Care | Payer: Medicare PPO | Source: Ambulatory Visit | Attending: Nephrology | Admitting: Nephrology

## 2022-10-29 VITALS — BP 155/62 | HR 64 | Temp 98.4°F | Resp 20

## 2022-10-29 DIAGNOSIS — N189 Chronic kidney disease, unspecified: Secondary | ICD-10-CM

## 2022-10-29 DIAGNOSIS — D631 Anemia in chronic kidney disease: Secondary | ICD-10-CM

## 2022-10-29 DIAGNOSIS — N184 Chronic kidney disease, stage 4 (severe): Secondary | ICD-10-CM | POA: Diagnosis not present

## 2022-10-29 LAB — CBC WITH DIFFERENTIAL/PLATELET
Abs Immature Granulocytes: 0.01 10*3/uL (ref 0.00–0.07)
Basophils Absolute: 0 10*3/uL (ref 0.0–0.1)
Basophils Relative: 1 %
Eosinophils Absolute: 0.2 10*3/uL (ref 0.0–0.5)
Eosinophils Relative: 4 %
HCT: 29.1 % — ABNORMAL LOW (ref 36.0–46.0)
Hemoglobin: 9 g/dL — ABNORMAL LOW (ref 12.0–15.0)
Immature Granulocytes: 0 %
Lymphocytes Relative: 26 %
Lymphs Abs: 1.1 10*3/uL (ref 0.7–4.0)
MCH: 30.9 pg (ref 26.0–34.0)
MCHC: 30.9 g/dL (ref 30.0–36.0)
MCV: 100 fL (ref 80.0–100.0)
Monocytes Absolute: 0.3 10*3/uL (ref 0.1–1.0)
Monocytes Relative: 8 %
Neutro Abs: 2.5 10*3/uL (ref 1.7–7.7)
Neutrophils Relative %: 61 %
Platelets: 195 10*3/uL (ref 150–400)
RBC: 2.91 MIL/uL — ABNORMAL LOW (ref 3.87–5.11)
RDW: 14.9 % (ref 11.5–15.5)
WBC: 4.1 10*3/uL (ref 4.0–10.5)
nRBC: 0 % (ref 0.0–0.2)

## 2022-10-29 LAB — RENAL FUNCTION PANEL
Albumin: 4.2 g/dL (ref 3.5–5.0)
Anion gap: 12 (ref 5–15)
BUN: 64 mg/dL — ABNORMAL HIGH (ref 8–23)
CO2: 23 mmol/L (ref 22–32)
Calcium: 9.3 mg/dL (ref 8.9–10.3)
Chloride: 106 mmol/L (ref 98–111)
Creatinine, Ser: 3.09 mg/dL — ABNORMAL HIGH (ref 0.44–1.00)
GFR, Estimated: 14 mL/min — ABNORMAL LOW (ref >60)
Glucose, Bld: 134 mg/dL — ABNORMAL HIGH (ref 70–99)
Phosphorus: 4.3 mg/dL (ref 2.5–4.6)
Potassium: 3.9 mmol/L (ref 3.5–5.1)
Sodium: 141 mmol/L (ref 135–145)

## 2022-10-29 LAB — PROTEIN / CREATININE RATIO, URINE
Creatinine, Urine: 98.65 mg/dL
Protein Creatinine Ratio: 0.4 mg/mg{Cre} — ABNORMAL HIGH (ref 0.00–0.15)
Total Protein, Urine: 39 mg/dL

## 2022-10-29 LAB — POCT HEMOGLOBIN-HEMACUE: Hemoglobin: 9.4 g/dL — ABNORMAL LOW (ref 12.0–15.0)

## 2022-10-29 MED ORDER — EPOETIN ALFA-EPBX 10000 UNIT/ML IJ SOLN
8000.0000 [IU] | Freq: Once | INTRAMUSCULAR | Status: AC
  Administered 2022-10-29: 8000 [IU] via SUBCUTANEOUS

## 2022-10-29 NOTE — Progress Notes (Signed)
Diagnosis: Anemia in Chronic Kidney Disease  Provider:  Manpreet Bhutani MD  Procedure: Injection  Retacrit (epoetin alfa-epbx), Dose: 8000 Units, Site: subcutaneous, Number of injections:   HGB 9.4   Post Care: Patient declined observation  Discharge: Condition: Good, Destination: Home . AVS provided to patient.   Performed by:  Grayland Ormond, RN

## 2022-10-29 NOTE — Addendum Note (Signed)
Encounter addended by: Grayland Ormond, RN on: 10/29/2022 12:26 PM  Actions taken: Charge Capture section accepted

## 2022-11-12 ENCOUNTER — Encounter (HOSPITAL_COMMUNITY)
Admission: RE | Admit: 2022-11-12 | Discharge: 2022-11-12 | Disposition: A | Discharge status: Home / Self Care | Payer: Medicare PPO | Source: Ambulatory Visit | Attending: Nephrology | Admitting: Nephrology

## 2022-11-12 VITALS — BP 159/58 | HR 64 | Temp 98.4°F | Resp 16

## 2022-11-12 DIAGNOSIS — D631 Anemia in chronic kidney disease: Secondary | ICD-10-CM

## 2022-11-12 DIAGNOSIS — N184 Chronic kidney disease, stage 4 (severe): Secondary | ICD-10-CM | POA: Insufficient documentation

## 2022-11-12 LAB — POCT HEMOGLOBIN-HEMACUE: Hemoglobin: 9.3 g/dL — ABNORMAL LOW (ref 12.0–15.0)

## 2022-11-12 MED ORDER — EPOETIN ALFA-EPBX 10000 UNIT/ML IJ SOLN
8000.0000 [IU] | Freq: Once | INTRAMUSCULAR | Status: AC
  Administered 2022-11-12: 8000 [IU] via SUBCUTANEOUS

## 2022-11-12 NOTE — Progress Notes (Signed)
Diagnosis: Anemia in Chronic Kidney Disease  Provider:  Manpreet Bhutani MD  Procedure: Injection  Retacrit (epoetin alfa-epbx), Dose: 8000 Units, Site: subcutaneous, Number of injections: 1  Hgb 9.3  Post Care: Observation period completed  Discharge: Condition: Good, Destination: Home . AVS Provided  Performed by:  Oren Beckmann, RN

## 2022-11-13 DIAGNOSIS — E1122 Type 2 diabetes mellitus with diabetic chronic kidney disease: Secondary | ICD-10-CM | POA: Diagnosis not present

## 2022-11-13 DIAGNOSIS — E1129 Type 2 diabetes mellitus with other diabetic kidney complication: Secondary | ICD-10-CM | POA: Diagnosis not present

## 2022-11-13 DIAGNOSIS — N189 Chronic kidney disease, unspecified: Secondary | ICD-10-CM | POA: Diagnosis not present

## 2022-11-13 DIAGNOSIS — E211 Secondary hyperparathyroidism, not elsewhere classified: Secondary | ICD-10-CM | POA: Diagnosis not present

## 2022-11-13 DIAGNOSIS — I129 Hypertensive chronic kidney disease with stage 1 through stage 4 chronic kidney disease, or unspecified chronic kidney disease: Secondary | ICD-10-CM | POA: Diagnosis not present

## 2022-11-13 DIAGNOSIS — D631 Anemia in chronic kidney disease: Secondary | ICD-10-CM | POA: Diagnosis not present

## 2022-11-13 DIAGNOSIS — N185 Chronic kidney disease, stage 5: Secondary | ICD-10-CM | POA: Diagnosis not present

## 2022-11-13 DIAGNOSIS — R809 Proteinuria, unspecified: Secondary | ICD-10-CM | POA: Diagnosis not present

## 2022-11-25 ENCOUNTER — Ambulatory Visit: Payer: Medicare PPO | Admitting: Podiatry

## 2022-11-25 VITALS — BP 154/70

## 2022-11-25 DIAGNOSIS — B351 Tinea unguium: Secondary | ICD-10-CM

## 2022-11-25 DIAGNOSIS — Z794 Long term (current) use of insulin: Secondary | ICD-10-CM

## 2022-11-25 DIAGNOSIS — E119 Type 2 diabetes mellitus without complications: Secondary | ICD-10-CM

## 2022-11-25 DIAGNOSIS — M79675 Pain in left toe(s): Secondary | ICD-10-CM | POA: Diagnosis not present

## 2022-11-25 DIAGNOSIS — M2042 Other hammer toe(s) (acquired), left foot: Secondary | ICD-10-CM

## 2022-11-25 DIAGNOSIS — M2041 Other hammer toe(s) (acquired), right foot: Secondary | ICD-10-CM

## 2022-11-25 DIAGNOSIS — M79674 Pain in right toe(s): Secondary | ICD-10-CM | POA: Diagnosis not present

## 2022-11-25 DIAGNOSIS — M2012 Hallux valgus (acquired), left foot: Secondary | ICD-10-CM

## 2022-11-25 DIAGNOSIS — E1121 Type 2 diabetes mellitus with diabetic nephropathy: Secondary | ICD-10-CM

## 2022-11-25 DIAGNOSIS — M2011 Hallux valgus (acquired), right foot: Secondary | ICD-10-CM | POA: Diagnosis not present

## 2022-11-25 DIAGNOSIS — H532 Diplopia: Secondary | ICD-10-CM | POA: Diagnosis not present

## 2022-11-25 DIAGNOSIS — H4920 Sixth [abducent] nerve palsy, unspecified eye: Secondary | ICD-10-CM | POA: Diagnosis not present

## 2022-11-26 ENCOUNTER — Encounter (HOSPITAL_COMMUNITY)
Admission: RE | Admit: 2022-11-26 | Discharge: 2022-11-26 | Disposition: A | Discharge status: Home / Self Care | Payer: Medicare PPO | Source: Ambulatory Visit | Attending: Nephrology | Admitting: Nephrology

## 2022-11-26 VITALS — BP 149/63 | HR 67 | Temp 98.2°F | Resp 20

## 2022-11-26 DIAGNOSIS — D631 Anemia in chronic kidney disease: Secondary | ICD-10-CM | POA: Diagnosis not present

## 2022-11-26 DIAGNOSIS — N184 Chronic kidney disease, stage 4 (severe): Secondary | ICD-10-CM | POA: Diagnosis not present

## 2022-11-26 LAB — POCT HEMOGLOBIN-HEMACUE: Hemoglobin: 9.2 g/dL — ABNORMAL LOW (ref 12.0–15.0)

## 2022-11-26 MED ORDER — EPOETIN ALFA-EPBX 10000 UNIT/ML IJ SOLN: 8000.0000 [IU] | Freq: Once | INTRAMUSCULAR | Status: DC

## 2022-11-26 MED ORDER — EPOETIN ALFA-EPBX 4000 UNIT/ML IJ SOLN
8000.0000 [IU] | Freq: Once | INTRAMUSCULAR | Status: AC
  Administered 2022-11-26: 8000 [IU] via SUBCUTANEOUS

## 2022-11-26 NOTE — Addendum Note (Signed)
Encounter addended by: Baxter Hire, RN on: 11/26/2022 3:55 PM  Actions taken: Charge Capture section accepted

## 2022-11-26 NOTE — Addendum Note (Signed)
Encounter addended by: Baxter Hire, RN on: 11/26/2022 10:39 AM  Actions taken: Therapy plan modified

## 2022-11-26 NOTE — Progress Notes (Signed)
Diagnosis: Anemia in Chronic Kidney Disease  Provider:  Manpreet Bhutani MD  Procedure: Injection  Retacrit (epoetin alfa-epbx), Dose: 8000 Units, Site: subcutaneous, Number of injections: 2  Hgb 9.2  Post Care: Observation period completed  Discharge: Condition: Good, Destination: Home . AVS Provided  Performed by:  Fraser Din Pilkington-Burchett, RN

## 2022-11-29 ENCOUNTER — Encounter: Payer: Self-pay | Admitting: Podiatry

## 2022-11-29 NOTE — Progress Notes (Signed)
ANNUAL DIABETIC FOOT EXAM  Subjective: Stephanie Middleton presents today for annual diabetic foot examination.  Chief Complaint  Patient presents with   Nail Problem    [facility] BS-88 A1C-6.2 Lorrene Reid PCP VST-08/2022   Patient confirms h/o diabetes.  Patient denies any h/o foot wounds.  Patient denies any numbness, tingling, burning, or pins/needle sensation in feet.  Risk factors: diabetes, HTN, CKD, hypercholesterolemia.  Iona Beard, MD is patient's PCP.  Past Medical History:  Diagnosis Date   Anemia    Chronic kidney disease    Chronic pain    Back, right hip and right knee   Diabetes mellitus without complication ([place])    Diplopia    Hypercholesteremia    Hypertension    Patient Active Problem List   Diagnosis Date Noted   Anemia in chronic kidney disease 07/25/2010   Chronic kidney disease 07/25/2010   Essential (primary) hypertension 07/25/2010   Low back pain 07/25/2010   Pure hypercholesterolemia 07/25/2010   Type 2 diabetes mellitus with diabetic nephropathy ([place]) 07/25/2010   Type 2 diabetes mellitus with hyperglycemia ([place]) 07/25/2010   Past Surgical History:  Procedure Laterality Date   CHOLECYSTECTOMY     CYST EXCISION     Current Outpatient Medications on File Prior to Visit  Medication Sig Dispense Refill   ACCU-CHEK GUIDE test strip      Accu-Chek Softclix Lancets lancets      acetaminophen (TYLENOL) 500 MG tablet Take 500 mg by mouth 2 (two) times a day.     allopurinol (ZYLOPRIM) 100 MG tablet Take 100 mg by mouth daily.     amLODipine (NORVASC) 5 MG tablet Take 5 mg by mouth daily.     amLODipine-valsartan (EXFORGE) 10-160 MG tablet Take 1 tablet by mouth daily. exforge HCT 5-160 mg/12.5 mg     aspirin EC 81 MG tablet Take 81 mg by mouth daily.     B-D ULTRAFINE III SHORT PEN 31G X 8 MM MISC Inject into the skin as directed.     calcitRIOL (ROCALTROL) 0.25 MCG capsule Take 0.25 mcg by mouth daily.     Cholecalciferol 125 MCG (5000  UT) capsule Take 5,000 Units by mouth daily.     cholestyramine light (PREVALITE) 4 g packet Take 4 g by mouth 2 (two) times daily.     colchicine 0.6 MG tablet Take 0.6 mg by mouth daily as needed.     epoetin alfa (EPOGEN) 4000 UNIT/ML injection Inject 8,000 Units into the skin every 14 (fourteen) days.      furosemide (LASIX) 20 MG tablet Take by mouth.     glipiZIDE (GLUCOTROL) 5 MG tablet Take by mouth 2 (two) times daily before a meal. Take 2 tablets twice daily     guanFACINE (TENEX) 2 MG tablet Take 2 mg by mouth at bedtime.     hydrochlorothiazide (HYDRODIURIL) 12.5 MG tablet      HYDROcodone-acetaminophen (NORCO/VICODIN) 5-325 MG tablet Take 1 tablet by mouth every 12 (twelve) hours as needed for moderate pain (take 1/2 tablet every 12 hours as needed).     iron polysaccharides (NIFEREX) 150 MG capsule Take 150 mg by mouth 2 (two) times daily.     labetalol (NORMODYNE) 200 MG tablet Take 200 mg by mouth 2 (two) times daily. Take 2 tablets twice daily     LEVEMIR FLEXTOUCH 100 UNIT/ML FlexPen 26 Units daily.     linagliptin (TRADJENTA) 5 MG TABS tablet Take 5 mg by mouth daily.     pravastatin (  PRAVACHOL) 40 MG tablet Take 40 mg by mouth at bedtime.     predniSONE (STERAPRED UNI-PAK 21 TAB) 10 MG (21) TBPK tablet Take 10 mg by mouth daily. Finished dose pack on Monday 05/15/2019.     RABEprazole (ACIPHEX) 20 MG tablet Take 20 mg by mouth daily as needed. 1 tablet by mouth everyday as needed for indigestion     valsartan (DIOVAN) 40 MG tablet Take 40 mg by mouth daily.     No current facility-administered medications on file prior to visit.    No Known Allergies Social History   Occupational History   Not on file  Tobacco Use   Smoking status: Never   Smokeless tobacco: Never  Vaping Use   Vaping Use: Never used  Substance and Sexual Activity   Alcohol use: Never   Drug use: Never   Sexual activity: Not Currently    Birth control/protection: None   Family History  Problem  Relation Age of Onset   Leukemia Mother    Heart disease Father    Immunization History  Administered Date(s) Administered   Influenza-Unspecified 07/05/2014     Review of Systems: Negative except as noted in the HPI.   Objective: Vitals:   11/25/22 1053  BP: (!) 154/70   Stephanie Middleton is a pleasant 89 y.o. female in NAD. AAO X 3.  Vascular Examination: Capillary refill time immediate b/l. Vascular status intact b/l with palpable pedal pulses. Pedal hair absent b/l. No edema. No pain with calf compression b/l. Skin temperature gradient WNL b/l.   Neurological Examination: Sensation grossly intact b/l with 10 gram monofilament. Vibratory sensation intact b/l.   Dermatological Examination: Pedal skin with normal turgor, texture and tone b/l.  No open wounds. No interdigital macerations.   Toenails 1-5 b/l thick, discolored, elongated with subungual debris and pain on dorsal palpation.   Callus resolved left great toe.  Musculoskeletal Examination: Muscle strength 5/5 to all lower extremity muscle groups bilaterally. HAV with bunion bilaterally and hammertoes 2-5 b/l. Patient ambulates independent of any assistive aids.  Radiographs: None  Last A1c:      Latest Ref Rng & Units 07/23/2022    2:21 PM  Hemoglobin A1C  Hemoglobin-A1c 4.8 - 5.6 % 6.4    Footwear Assessment: Does the patient wear appropriate shoes? Yes. Does the patient need inserts/orthotics? No.  Lab Results  Component Value Date   HGBA1C 6.4 (H) 07/23/2022   ADA Risk Categorization: Low Risk :  Patient has all of the following: Intact protective sensation No prior foot ulcer  No severe deformity Pedal pulses present  Assessment: 1. Pain due to onychomycosis of toenails of both feet   2. Hallux valgus, acquired, bilateral   3. Hammertoes of both feet   4. Type 2 diabetes mellitus with diabetic nephropathy, with long-term current use of insulin ([place])   5. Encounter for diabetic foot exam  [facility])     Plan: -Patient's family member present. All questions/concerns addressed on today's visit. -Patient to continue soft, supportive shoe gear daily. -Mycotic toenails 1-5 bilaterally were debrided in length and girth with sterile nail nippers and dremel without incident. -Patient/POA to call should there be question/concern in the interim. Return in about 3 months (around 02/23/2023).  Marzetta Board, DPM

## 2022-12-03 ENCOUNTER — Encounter: Payer: Self-pay | Admitting: Radiology

## 2022-12-07 DIAGNOSIS — E1122 Type 2 diabetes mellitus with diabetic chronic kidney disease: Secondary | ICD-10-CM | POA: Diagnosis not present

## 2022-12-10 ENCOUNTER — Encounter (HOSPITAL_COMMUNITY)
Admission: RE | Admit: 2022-12-10 | Discharge: 2022-12-10 | Disposition: A | Discharge status: Home / Self Care | Payer: Medicare PPO | Source: Ambulatory Visit | Attending: Nephrology | Admitting: Nephrology

## 2022-12-10 VITALS — BP 149/65 | HR 64 | Temp 98.5°F | Resp 16

## 2022-12-10 DIAGNOSIS — N185 Chronic kidney disease, stage 5: Secondary | ICD-10-CM | POA: Insufficient documentation

## 2022-12-10 DIAGNOSIS — D631 Anemia in chronic kidney disease: Secondary | ICD-10-CM | POA: Diagnosis not present

## 2022-12-10 LAB — POCT HEMOGLOBIN-HEMACUE: Hemoglobin: 8.6 g/dL — ABNORMAL LOW (ref 12.0–15.0)

## 2022-12-10 MED ORDER — EPOETIN ALFA-EPBX 10000 UNIT/ML IJ SOLN
8000.0000 [IU] | Freq: Once | INTRAMUSCULAR | Status: AC
  Administered 2022-12-10: 8000 [IU] via SUBCUTANEOUS

## 2022-12-10 NOTE — Progress Notes (Signed)
Diagnosis: Anemia in Chronic Kidney Disease  Provider:  Manpreet Bhutani MD  Procedure: Injection  Retacrit (epoetin alfa-epbx), Dose: 8000 Units, Site: subcutaneous, Number of injections: 1  Hgb 8.6  Post Care: Patient declined observation  Discharge: Condition: Good, Destination: Home . AVS Provided  Performed by:  Baxter Hire, RN

## 2022-12-10 NOTE — Addendum Note (Signed)
Encounter addended by: Baxter Hire, RN on: 12/10/2022 3:09 PM  Actions taken: Therapy plan modified

## 2022-12-24 ENCOUNTER — Encounter (HOSPITAL_COMMUNITY)
Admission: RE | Admit: 2022-12-24 | Discharge: 2022-12-24 | Disposition: A | Discharge status: Home / Self Care | Payer: Medicare PPO | Source: Ambulatory Visit | Attending: Nephrology | Admitting: Nephrology

## 2022-12-24 VITALS — BP 146/63 | HR 60 | Temp 98.0°F | Resp 16

## 2022-12-24 DIAGNOSIS — D631 Anemia in chronic kidney disease: Secondary | ICD-10-CM | POA: Diagnosis not present

## 2022-12-24 DIAGNOSIS — N185 Chronic kidney disease, stage 5: Secondary | ICD-10-CM | POA: Diagnosis not present

## 2022-12-24 LAB — POCT HEMOGLOBIN-HEMACUE: Hemoglobin: 9.4 g/dL — ABNORMAL LOW (ref 12.0–15.0)

## 2022-12-24 MED ORDER — EPOETIN ALFA-EPBX 10000 UNIT/ML IJ SOLN
8000.0000 [IU] | Freq: Once | INTRAMUSCULAR | Status: AC
  Administered 2022-12-24: 8000 [IU] via SUBCUTANEOUS
  Filled 2022-12-24: qty 1

## 2022-12-24 NOTE — Progress Notes (Signed)
Diagnosis: Anemia in Chronic Kidney Disease  Provider:  Manpreet Bhutani MD  Procedure: Injection  Retacrit (epoetin alfa-epbx), Dose: 8000 Units, Site: subcutaneous, Number of injections: 1  HGB 9.4  Post Care: Patient declined observation  Discharge: Condition: Good, Destination: Home . AVS Provided  Performed by:  Grayland Ormond, RN

## 2022-12-24 NOTE — Addendum Note (Signed)
Encounter addended by: Baxter Hire, RN on: 12/24/2022 12:00 PM  Actions taken: Therapy plan modified

## 2023-01-07 ENCOUNTER — Encounter (HOSPITAL_COMMUNITY)
Admission: RE | Admit: 2023-01-07 | Discharge: 2023-01-07 | Disposition: A | Discharge status: Home / Self Care | Payer: Medicare PPO | Source: Ambulatory Visit | Attending: Nephrology | Admitting: Nephrology

## 2023-01-07 ENCOUNTER — Telehealth (HOSPITAL_COMMUNITY): Payer: Self-pay

## 2023-01-07 VITALS — BP 165/59 | HR 69 | Temp 98.2°F | Resp 16

## 2023-01-07 DIAGNOSIS — N184 Chronic kidney disease, stage 4 (severe): Secondary | ICD-10-CM | POA: Insufficient documentation

## 2023-01-07 DIAGNOSIS — D631 Anemia in chronic kidney disease: Secondary | ICD-10-CM | POA: Diagnosis not present

## 2023-01-07 DIAGNOSIS — N189 Chronic kidney disease, unspecified: Secondary | ICD-10-CM | POA: Diagnosis not present

## 2023-01-07 LAB — RENAL FUNCTION PANEL
Albumin: 4.3 g/dL (ref 3.5–5.0)
Anion gap: 10 (ref 5–15)
BUN: 59 mg/dL — ABNORMAL HIGH (ref 8–23)
CO2: 23 mmol/L (ref 22–32)
Calcium: 9.3 mg/dL (ref 8.9–10.3)
Chloride: 107 mmol/L (ref 98–111)
Creatinine, Ser: 2.71 mg/dL — ABNORMAL HIGH (ref 0.44–1.00)
GFR, Estimated: 16 mL/min — ABNORMAL LOW (ref >60)
Glucose, Bld: 171 mg/dL — ABNORMAL HIGH (ref 70–99)
Phosphorus: 3.8 mg/dL (ref 2.5–4.6)
Potassium: 4.3 mmol/L (ref 3.5–5.1)
Sodium: 140 mmol/L (ref 135–145)

## 2023-01-07 LAB — IRON AND TIBC
Iron: 67 ug/dL (ref 28–170)
Saturation Ratios: 23 % (ref 10.4–31.8)
TIBC: 287 ug/dL (ref 250–450)
UIBC: 220 ug/dL

## 2023-01-07 LAB — CBC WITH DIFFERENTIAL/PLATELET
Abs Immature Granulocytes: 0.02 10*3/uL (ref 0.00–0.07)
Basophils Absolute: 0 10*3/uL (ref 0.0–0.1)
Basophils Relative: 1 %
Eosinophils Absolute: 0.2 10*3/uL (ref 0.0–0.5)
Eosinophils Relative: 5 %
HCT: 30.3 % — ABNORMAL LOW (ref 36.0–46.0)
Hemoglobin: 9.3 g/dL — ABNORMAL LOW (ref 12.0–15.0)
Immature Granulocytes: 0 %
Lymphocytes Relative: 23 %
Lymphs Abs: 1 10*3/uL (ref 0.7–4.0)
MCH: 30.8 pg (ref 26.0–34.0)
MCHC: 30.7 g/dL (ref 30.0–36.0)
MCV: 100.3 fL — ABNORMAL HIGH (ref 80.0–100.0)
Monocytes Absolute: 0.3 10*3/uL (ref 0.1–1.0)
Monocytes Relative: 6 %
Neutro Abs: 2.9 10*3/uL (ref 1.7–7.7)
Neutrophils Relative %: 65 %
Platelets: 216 10*3/uL (ref 150–400)
RBC: 3.02 MIL/uL — ABNORMAL LOW (ref 3.87–5.11)
RDW: 15.5 % (ref 11.5–15.5)
WBC: 4.5 10*3/uL (ref 4.0–10.5)
nRBC: 0 % (ref 0.0–0.2)

## 2023-01-07 LAB — PROTEIN / CREATININE RATIO, URINE
Creatinine, Urine: 76 mg/dL
Protein Creatinine Ratio: 0.57 mg/mg{Cre} — ABNORMAL HIGH (ref 0.00–0.15)
Total Protein, Urine: 43 mg/dL

## 2023-01-07 LAB — VITAMIN D 25 HYDROXY (VIT D DEFICIENCY, FRACTURES): Vit D, 25-Hydroxy: 72.9 ng/mL (ref 30–100)

## 2023-01-07 LAB — POCT HEMOGLOBIN-HEMACUE: Hemoglobin: 9.6 g/dL — ABNORMAL LOW (ref 12.0–15.0)

## 2023-01-07 MED ORDER — EPOETIN ALFA-EPBX 10000 UNIT/ML IJ SOLN
8000.0000 [IU] | Freq: Once | INTRAMUSCULAR | Status: AC
  Administered 2023-01-07: 8000 [IU] via SUBCUTANEOUS
  Filled 2023-01-07: qty 1

## 2023-01-07 NOTE — Addendum Note (Signed)
Encounter addended by: Binnie Kand, RN on: 01/07/2023 10:46 AM  Actions taken: Therapy plan modified

## 2023-01-07 NOTE — Telephone Encounter (Signed)
Spoke with Thayer Headings at Dr. Tinnie Gens office. Patient brought urine specimen to today's appointment; confirmed verbal order to add urine specimen for lab work today.   Binnie Kand, RN

## 2023-01-07 NOTE — Addendum Note (Signed)
Encounter addended by: Binnie Kand, RN on: 01/07/2023 10:35 AM  Actions taken: Flowsheet accepted

## 2023-01-07 NOTE — Progress Notes (Signed)
Diagnosis: Anemia in Chronic Kidney Disease  Provider:  Manpreet Bhutani MD  Procedure: Injection  Retacrit (epoetin alfa-epbx), Dose: 8000 Units, Site: subcutaneous, Number of injections: 1    HGB 9.6   Post Care: Patient declined observation  Discharge: Condition: Good, Destination: Home . AVS Provided  Performed by:  Grayland Ormond, RN

## 2023-01-08 DIAGNOSIS — D631 Anemia in chronic kidney disease: Secondary | ICD-10-CM | POA: Diagnosis not present

## 2023-01-08 DIAGNOSIS — R809 Proteinuria, unspecified: Secondary | ICD-10-CM | POA: Diagnosis not present

## 2023-01-08 DIAGNOSIS — E1129 Type 2 diabetes mellitus with other diabetic kidney complication: Secondary | ICD-10-CM | POA: Diagnosis not present

## 2023-01-08 DIAGNOSIS — E211 Secondary hyperparathyroidism, not elsewhere classified: Secondary | ICD-10-CM | POA: Diagnosis not present

## 2023-01-08 DIAGNOSIS — E1122 Type 2 diabetes mellitus with diabetic chronic kidney disease: Secondary | ICD-10-CM | POA: Diagnosis not present

## 2023-01-08 DIAGNOSIS — N189 Chronic kidney disease, unspecified: Secondary | ICD-10-CM | POA: Diagnosis not present

## 2023-01-08 DIAGNOSIS — I129 Hypertensive chronic kidney disease with stage 1 through stage 4 chronic kidney disease, or unspecified chronic kidney disease: Secondary | ICD-10-CM | POA: Diagnosis not present

## 2023-01-08 LAB — PTH, INTACT AND CALCIUM
Calcium, Total (PTH): 9.7 mg/dL (ref 8.7–10.3)
PTH: 115 pg/mL — ABNORMAL HIGH (ref 15–65)

## 2023-01-21 ENCOUNTER — Encounter (HOSPITAL_COMMUNITY)
Admission: RE | Admit: 2023-01-21 | Discharge: 2023-01-21 | Disposition: A | Discharge status: Home / Self Care | Payer: Medicare PPO | Source: Ambulatory Visit | Attending: Nephrology | Admitting: Nephrology

## 2023-01-21 VITALS — BP 140/64 | HR 63 | Temp 98.4°F | Resp 16

## 2023-01-21 DIAGNOSIS — D631 Anemia in chronic kidney disease: Secondary | ICD-10-CM

## 2023-01-21 DIAGNOSIS — N184 Chronic kidney disease, stage 4 (severe): Secondary | ICD-10-CM | POA: Diagnosis not present

## 2023-01-21 DIAGNOSIS — N189 Chronic kidney disease, unspecified: Secondary | ICD-10-CM | POA: Diagnosis not present

## 2023-01-21 LAB — POCT HEMOGLOBIN-HEMACUE: Hemoglobin: 9.3 g/dL — ABNORMAL LOW (ref 12.0–15.0)

## 2023-01-21 MED ORDER — EPOETIN ALFA-EPBX 10000 UNIT/ML IJ SOLN
8000.0000 [IU] | Freq: Once | INTRAMUSCULAR | Status: AC
  Administered 2023-01-21: 8 [IU] via SUBCUTANEOUS

## 2023-01-21 NOTE — Progress Notes (Signed)
Diagnosis: Anemia in Chronic Kidney Disease  Provider:  Manpreet Bhutani MD  Procedure: Injection  Retacrit (epoetin alfa-epbx), Dose: 8000 Units, Site: subcutaneous, Number of injections: 1  Hgb 9.3  Post Care: Patient declined observation  Discharge: Condition: Good, Destination: Home . AVS Provided  Performed by:  Wyvonne Lenz, RN

## 2023-01-26 DIAGNOSIS — R351 Nocturia: Secondary | ICD-10-CM | POA: Diagnosis not present

## 2023-01-26 DIAGNOSIS — M150 Primary generalized (osteo)arthritis: Secondary | ICD-10-CM | POA: Diagnosis not present

## 2023-01-26 DIAGNOSIS — I10 Essential (primary) hypertension: Secondary | ICD-10-CM | POA: Diagnosis not present

## 2023-01-26 DIAGNOSIS — N184 Chronic kidney disease, stage 4 (severe): Secondary | ICD-10-CM | POA: Diagnosis not present

## 2023-01-26 DIAGNOSIS — E1122 Type 2 diabetes mellitus with diabetic chronic kidney disease: Secondary | ICD-10-CM | POA: Diagnosis not present

## 2023-02-04 ENCOUNTER — Encounter (HOSPITAL_COMMUNITY)
Admission: RE | Admit: 2023-02-04 | Discharge: 2023-02-04 | Disposition: A | Discharge status: Home / Self Care | Payer: Medicare PPO | Source: Ambulatory Visit | Attending: Nephrology | Admitting: Nephrology

## 2023-02-04 VITALS — BP 162/65 | HR 64 | Temp 98.2°F | Resp 16

## 2023-02-04 DIAGNOSIS — D631 Anemia in chronic kidney disease: Secondary | ICD-10-CM | POA: Insufficient documentation

## 2023-02-04 DIAGNOSIS — N184 Chronic kidney disease, stage 4 (severe): Secondary | ICD-10-CM | POA: Diagnosis not present

## 2023-02-04 LAB — POCT HEMOGLOBIN-HEMACUE: Hemoglobin: 8.7 g/dL — ABNORMAL LOW (ref 12.0–15.0)

## 2023-02-04 MED ORDER — EPOETIN ALFA-EPBX 10000 UNIT/ML IJ SOLN
8000.0000 [IU] | Freq: Once | INTRAMUSCULAR | Status: AC
  Administered 2023-02-04: 8000 [IU] via SUBCUTANEOUS

## 2023-02-04 NOTE — Progress Notes (Signed)
Diagnosis: Anemia in Chronic Kidney Disease  Provider:  Manpreet Bhutani MD  Procedure: Injection  Retacrit (epoetin alfa-epbx), Dose: 8000 Units, Site: subcutaneous, Number of injections: 1  Hgb 8.7. Administered in right arm.  Post Care: Patient declined observation  Discharge: Condition: Good, Destination: Home . AVS declined.  Performed by:  Wyvonne Lenz, RN

## 2023-02-04 NOTE — Addendum Note (Signed)
Encounter addended by: Clair Bardwell E, RN on: 02/04/2023 1:28 PM  Actions taken: Therapy plan modified

## 2023-02-18 ENCOUNTER — Encounter (HOSPITAL_COMMUNITY)
Admission: RE | Admit: 2023-02-18 | Discharge: 2023-02-18 | Disposition: A | Discharge status: Home / Self Care | Payer: Medicare PPO | Source: Ambulatory Visit | Attending: Nephrology | Admitting: Nephrology

## 2023-02-18 VITALS — BP 160/76 | HR 70 | Temp 98.0°F | Resp 16

## 2023-02-18 DIAGNOSIS — D631 Anemia in chronic kidney disease: Secondary | ICD-10-CM | POA: Diagnosis not present

## 2023-02-18 DIAGNOSIS — N184 Chronic kidney disease, stage 4 (severe): Secondary | ICD-10-CM | POA: Diagnosis not present

## 2023-02-18 LAB — POCT HEMOGLOBIN-HEMACUE: Hemoglobin: 9.5 g/dL — ABNORMAL LOW (ref 12.0–15.0)

## 2023-02-18 MED ORDER — EPOETIN ALFA-EPBX 10000 UNIT/ML IJ SOLN
8000.0000 [IU] | Freq: Once | INTRAMUSCULAR | Status: AC
  Administered 2023-02-18: 8000 [IU] via SUBCUTANEOUS

## 2023-02-18 NOTE — Addendum Note (Signed)
Encounter addended by: Wyvonne Lenz, RN on: 02/18/2023 10:54 AM  Actions taken: Therapy plan modified

## 2023-02-18 NOTE — Progress Notes (Signed)
Diagnosis: Anemia in Chronic Kidney Disease  Provider:  Celso Amy MD  Procedure: Injection  Retacrit (epoetin alfa-epbx), Dose: 8000 Units, Site: subcutaneous, Number of injections: 1  Hgb  9.5  Post Care: Patient declined observation  Discharge: Condition: Good, Destination: Home . AVS Declined  Performed by:  Marlow Baars Pilkington-Burchett, RN

## 2023-02-19 ENCOUNTER — Ambulatory Visit: Payer: Medicare PPO | Admitting: Orthopedic Surgery

## 2023-02-19 ENCOUNTER — Encounter (HOSPITAL_COMMUNITY): Payer: Self-pay | Admitting: Nephrology

## 2023-02-19 DIAGNOSIS — M48062 Spinal stenosis, lumbar region with neurogenic claudication: Secondary | ICD-10-CM | POA: Diagnosis not present

## 2023-02-19 DIAGNOSIS — M5410 Radiculopathy, site unspecified: Secondary | ICD-10-CM

## 2023-02-19 NOTE — Patient Instructions (Addendum)
We are referring you to [facility] from [facility] Office address is [address] [place] The phone number is [phone]  The office will call you with an appointment Dr. Newton  

## 2023-02-19 NOTE — Progress Notes (Signed)
Chief Complaint  Patient presents with   Back Pain   89 year old female with spinal stenosis.  Initially was recommended that she get epidurals but she declined she tried other nonoperative measures which failed to improve and now her right leg pain and back pain have worsened  She has intact dorsiflexion plantarflexion of the foot with intact extension of the knee;  She is noted to have normal hip range of motion without pain  And positive straight leg raise right side  According to the report: Findings at that time included grade 2 anterolisthesis L4 on 5 and then at L4-5 there was severe bilateral facet arthritis with ligamentum flavum infolding resulting in severe spinal stenosis with bilateral lateral recess stenosis and severe right foraminal stenosis    Recommend epidural injection at L4-5

## 2023-03-04 ENCOUNTER — Ambulatory Visit: Payer: Medicare PPO | Admitting: Physical Medicine and Rehabilitation

## 2023-03-04 ENCOUNTER — Encounter (HOSPITAL_COMMUNITY)
Admission: RE | Admit: 2023-03-04 | Discharge: 2023-03-04 | Disposition: A | Discharge status: Home / Self Care | Payer: Medicare PPO | Source: Ambulatory Visit | Attending: Nephrology | Admitting: Nephrology

## 2023-03-04 ENCOUNTER — Encounter: Payer: Self-pay | Admitting: Physical Medicine and Rehabilitation

## 2023-03-04 VITALS — BP 158/56 | HR 65 | Temp 98.1°F | Resp 16

## 2023-03-04 DIAGNOSIS — G8929 Other chronic pain: Secondary | ICD-10-CM

## 2023-03-04 DIAGNOSIS — M48062 Spinal stenosis, lumbar region with neurogenic claudication: Secondary | ICD-10-CM

## 2023-03-04 DIAGNOSIS — D631 Anemia in chronic kidney disease: Secondary | ICD-10-CM | POA: Diagnosis not present

## 2023-03-04 DIAGNOSIS — M5416 Radiculopathy, lumbar region: Secondary | ICD-10-CM

## 2023-03-04 DIAGNOSIS — M5441 Lumbago with sciatica, right side: Secondary | ICD-10-CM | POA: Diagnosis not present

## 2023-03-04 DIAGNOSIS — N184 Chronic kidney disease, stage 4 (severe): Secondary | ICD-10-CM | POA: Diagnosis not present

## 2023-03-04 DIAGNOSIS — R269 Unspecified abnormalities of gait and mobility: Secondary | ICD-10-CM

## 2023-03-04 LAB — POCT HEMOGLOBIN-HEMACUE: Hemoglobin: 9.8 g/dL — ABNORMAL LOW (ref 12.0–15.0)

## 2023-03-04 MED ORDER — EPOETIN ALFA-EPBX 10000 UNIT/ML IJ SOLN
8000.0000 [IU] | Freq: Once | INTRAMUSCULAR | Status: AC
  Administered 2023-03-04: 8000 [IU] via SUBCUTANEOUS
  Filled 2023-03-04: qty 1

## 2023-03-04 NOTE — Progress Notes (Signed)
Stephanie Middleton - 89 y.o. female MRN [id number]  Date of birth: 06/26/1931  Office Visit Note: Visit Date: 03/04/2023 PCP: [name], MD Referred by: [name], MD  Subjective: Chief Complaint  Patient presents with   Lower Back - Pain   HPI: Stephanie Middleton is a 89 y.o. female who comes in today per the request of Dr. Fuller Canada for evaluation of chronic, worsening and severe right sided lower back pain radiating to buttock and down lateral leg to foot. Daughter accompanying patient during our visit today. Pain ongoing for several years, worsened with prolonged standing and walking. She describes pain as sore, aching and cramping sensation, currently rates as 8 out of 10. Some relief of pain with home exercise regimen, rest and use of medications. She has attended multiple sessions of formal physical therapy over the years, minimal relief of pain with these treatments. Lumbar MRI imaging from 2023 exhibits prior right laminectomy at L5-S1, 11 mm anterolisthesis of L4 on L5. There is also severe multifactorial spinal canal stenosis at this level. Prior right sided laminectomy at L5-S1 in 2005. No history of lumbar injections. Patient using cane to assist with ambulation. Patient denies focal weakness, numbness and tingling. No recent trauma or falls.     Oswestry Disability Index Score 50% 20 to 30 (60%) severe disability: Pain remains the main problem in this group but activities of daily living are affected. These patients require a detailed investigation.  Review of Systems  Musculoskeletal:  Positive for back pain.  Neurological:  Negative for tingling, sensory change, focal weakness and weakness.  All other systems reviewed and are negative.  Otherwise per HPI.  Assessment & Plan: Visit Diagnoses:    ICD-10-CM   1. Chronic right-sided low back pain with right-sided sciatica  M54.41    G89.29     2. Lumbar radiculopathy  M54.16     3. Spinal stenosis of lumbar  region with neurogenic claudication  M48.062     4. Gait abnormality  R26.9        Plan: Findings:  Chronic, worsening and severe right sided lower back pain radiating to buttock and down lateral leg to foot. Patient continues to have severe pain despite good conservative therapies such as formal physical therapy, home exercise regimen, rest and use of medications.  Patient's pain pattern is consistent with L5 nerve distribution.  Her clinical presentation and exam are consistent with neurogenic claudication as a result of spinal canal stenosis.  There is severe multifactorial spinal canal stenosis at the level of L4-L5.  She does experience severe pain with prolonged standing and walking.  Next step is to perform right L5 transforaminal epidural steroid injection under fluoroscopic guidance.  If good relief of pain with this injection we can repeat infrequently as needed.  Dr. Alvester Morin and myself discussed injection procedure in detail with patient and daughter today, patient has no questions at this time.  She is also concerned about right knee pain, however we will proceed with injection at this time.  If this turns out to be a intrinsic right knee issue we will refer back to orthopedics for further evaluation.  No red flag symptoms noted upon exam today.    Meds & Orders: No orders of the defined types were placed in this encounter.  No orders of the defined types were placed in this encounter.   Follow-up: Return for Right L5 transforaminal epidural steroid injection.   Procedures: No procedures performed  Clinical History: EXAM: MRI LUMBAR SPINE WITHOUT CONTRAST   TECHNIQUE: Multiplanar, multisequence MR imaging of the lumbar spine was performed. No intravenous contrast was administered.   COMPARISON:  Lumbar spine MRI 05/26/2016   FINDINGS: The axial T2 sequence is moderately to severely motion degraded.   Segmentation: Standard.   Alignment: Unchanged 11 mm anterolisthesis  of L4 on L5. Mild lower lumbar levoscoliosis.   Vertebrae: Mild chronic T11 compression fracture. Unchanged L3 inferior endplate Schmorl's node. No new fracture, suspicious marrow lesion, or significant marrow edema.   Conus medullaris and cauda equina: Conus extends to the L1 level. Conus and cauda equina appear normal.   Paraspinal and other soft tissues: Unremarkable.   Disc levels:   T12-L1: Negative.   L1-2: Disc desiccation. Minimal disc bulging without stenosis, unchanged.   L2-3: Minimal leftward disc bulging and mild facet and ligamentum flavum hypertrophy without stenosis, unchanged.   L3-4: Disc desiccation. Disc bulging, a left foraminal disc protrusion, and moderate facet and ligamentum flavum hypertrophy result in mild-to-moderate left neural foraminal stenosis without spinal stenosis, unchanged.   L4-5: Disc desiccation and severe disc space narrowing. Possible prior right laminotomy. Anterolisthesis with bulging uncovered disc, left ligamentum flavum hypertrophy, and severe facet arthrosis result in severe spinal stenosis and severe right and mild left neural foraminal stenosis, overall mildly progressed.   L5-S1: Prior right laminotomy. Minimal disc bulging and mild facet and ligamentum flavum hypertrophy result in borderline to mild left neural foraminal stenosis without spinal stenosis, unchanged.   IMPRESSION: 1. Severe L4-5 disc and facet degeneration with grade 2 anterolisthesis, severe spinal stenosis, and severe right neural foraminal stenosis, mildly progressed from 2017. 2. Unchanged disc and facet degeneration elsewhere as above.     Electronically Signed   By: Sebastian Ache M.D.   On: 06/02/2022 15:24   She reports that she has never smoked. She has never used smokeless tobacco.  Recent Labs    07/23/22 1421  HGBA1C 6.4*    Objective:  VS:  HT:    WT:   BMI:     BP:   HR: bpm  TEMP: ( )  RESP:  Physical Exam Vitals and nursing  note reviewed.  HENT:     Head: Normocephalic and atraumatic.     Right Ear: External ear normal.     Left Ear: External ear normal.     Nose: Nose normal.     Mouth/Throat:     Mouth: Mucous membranes are moist.  Eyes:     Extraocular Movements: Extraocular movements intact.  Cardiovascular:     Rate and Rhythm: Normal rate.     Pulses: Normal pulses.  Pulmonary:     Effort: Pulmonary effort is normal.  Abdominal:     General: Abdomen is flat. There is no distension.  Musculoskeletal:        General: Tenderness present.     Cervical back: Normal range of motion.     Comments: Patient is slow to rise from seated position to standing. Good lumbar range of motion. No pain noted with facet loading. 5/5 strength noted with bilateral hip flexion, knee flexion/extension, ankle dorsiflexion/plantarflexion and EHL. No clonus noted bilaterally. No pain upon palpation of greater trochanters. No pain with internal/external rotation of bilateral hips. Sensation intact bilaterally. Dysesthesias to right L5 dermatome. Negative slump test bilaterally. Ambulates with cane, gait slow and unsteady.     Skin:    General: Skin is warm and dry.     Capillary Refill: Capillary refill  takes less than 2 seconds.  Neurological:     Mental Status: She is alert and oriented to person, place, and time.     Gait: Gait abnormal.  Psychiatric:        Mood and Affect: Mood normal.        Behavior: Behavior normal.     Ortho Exam  Imaging: No results found.  Past Medical/Family/Surgical/Social History: Medications & Allergies reviewed per EMR, new medications updated. Patient Active Problem List   Diagnosis Date Noted   Anemia in chronic kidney disease 07/25/2010   Chronic kidney disease 07/25/2010   Essential (primary) hypertension 07/25/2010   Low back pain 07/25/2010   Pure hypercholesterolemia 07/25/2010   Type 2 diabetes mellitus with diabetic nephropathy ([facility]) 07/25/2010   Type 2 diabetes  mellitus with hyperglycemia ([facility]) 07/25/2010   Past Medical History:  Diagnosis Date   Anemia    Chronic kidney disease    Chronic pain    Back, right hip and right knee   Diabetes mellitus without complication ([facility])    Diplopia    Hypercholesteremia    Hypertension    Family History  Problem Relation Age of Onset   Leukemia Mother    Heart disease Father    Past Surgical History:  Procedure Laterality Date   CHOLECYSTECTOMY     CYST EXCISION     Social History   Occupational History   Not on file  Tobacco Use   Smoking status: Never   Smokeless tobacco: Never  Vaping Use   Vaping Use: Never used  Substance and Sexual Activity   Alcohol use: Never   Drug use: Never   Sexual activity: Not Currently    Birth control/protection: None

## 2023-03-04 NOTE — Progress Notes (Signed)
Diagnosis: Anemia in Chronic Kidney Disease  Provider:  Celso Amy MD  Procedure: Injection  Retacrit (epoetin alfa-epbx), Dose: 8000 Units, Site: subcutaneous, Number of injections: 1  Hgb  9.8  Post Care: Patient declined observation  Discharge: Condition: Good, Destination: Home . AVS Provided  Performed by:  Marin Shutter, RN

## 2023-03-04 NOTE — Progress Notes (Signed)
Functional Pain Scale - descriptive words and definitions  Moderate (4)   Constantly aware of pain, can complete ADLs with modification/sleep marginally affected at times/passive distraction is of no use, but active distraction gives some relief. Moderate range order  Average Pain 5 or more  Lower back pain on right with radiation down the right leg

## 2023-03-16 ENCOUNTER — Ambulatory Visit: Payer: Medicare PPO | Admitting: Podiatry

## 2023-03-16 VITALS — BP 157/63 | HR 62 | Temp 98.0°F

## 2023-03-16 DIAGNOSIS — M79675 Pain in left toe(s): Secondary | ICD-10-CM | POA: Diagnosis not present

## 2023-03-16 DIAGNOSIS — B351 Tinea unguium: Secondary | ICD-10-CM | POA: Diagnosis not present

## 2023-03-16 DIAGNOSIS — M79674 Pain in right toe(s): Secondary | ICD-10-CM | POA: Diagnosis not present

## 2023-03-16 DIAGNOSIS — Z794 Long term (current) use of insulin: Secondary | ICD-10-CM

## 2023-03-16 DIAGNOSIS — E1121 Type 2 diabetes mellitus with diabetic nephropathy: Secondary | ICD-10-CM

## 2023-03-18 ENCOUNTER — Encounter (HOSPITAL_COMMUNITY)
Admission: RE | Admit: 2023-03-18 | Discharge: 2023-03-18 | Disposition: A | Discharge status: Home / Self Care | Payer: Medicare PPO | Source: Ambulatory Visit | Attending: Nephrology | Admitting: Nephrology

## 2023-03-18 VITALS — BP 137/60 | HR 60 | Temp 98.3°F | Resp 16

## 2023-03-18 DIAGNOSIS — D631 Anemia in chronic kidney disease: Secondary | ICD-10-CM | POA: Diagnosis not present

## 2023-03-18 DIAGNOSIS — N184 Chronic kidney disease, stage 4 (severe): Secondary | ICD-10-CM | POA: Diagnosis not present

## 2023-03-18 LAB — POCT HEMOGLOBIN-HEMACUE: Hemoglobin: 9.4 g/dL — ABNORMAL LOW (ref 12.0–15.0)

## 2023-03-18 MED ORDER — EPOETIN ALFA-EPBX 10000 UNIT/ML IJ SOLN
8000.0000 [IU] | Freq: Once | INTRAMUSCULAR | Status: AC
  Administered 2023-03-18: 8000 [IU] via SUBCUTANEOUS

## 2023-03-18 NOTE — Addendum Note (Signed)
Encounter addended by: Wyvonne Lenz, RN on: 03/18/2023 11:37 AM  Actions taken: Therapy plan modified

## 2023-03-18 NOTE — Progress Notes (Signed)
Diagnosis: Anemia in Chronic Kidney Disease  Provider:  Celso Amy MD  Procedure: Subcutaneous Infusion  Retacrit (epoetin alfa-epbx), Dose: 8000 Units, Site: subcutaneous, Number of injections: 1  Post Care: Patient declined observation  HGB 9.4  Discharge: Condition: Good, Destination: Home . AVS Provided  Performed by:  Jasper Riling, RN

## 2023-03-19 ENCOUNTER — Telehealth: Payer: Self-pay

## 2023-03-19 NOTE — Telephone Encounter (Signed)
Patient called in and stated she needs an injection. She was seen on 5/30 and it doesn't look like the injection order was placed. Is the plan still to do an injection?

## 2023-03-20 NOTE — Progress Notes (Signed)
  Subjective:  Patient ID: Stephanie Middleton, female    DOB: Dec 26, 1930,  MRN: [id number]  Stephanie Middleton presents to clinic today for: at risk foot care. Pt has h/o NIDDM with chronic kidney disease and painful elongated mycotic toenails 1-5 bilaterally which are tender when wearing enclosed shoe gear. Pain is relieved with periodic professional debridement.  Chief Complaint  Patient presents with   RFC     3 month RFC. No concerns from the patient.     PCP is [name], MD.  No Known Allergies  Review of Systems: Negative except as noted in the HPI.  Objective: No changes noted in today's physical examination. Vitals:   03/16/23 1024 03/16/23 1048  BP: (!) 167/68 (!) 157/63  Pulse: 64 62  Temp: (!) 97.3 F (36.3 C) 98 F (36.7 C)  SpO2: 99% 98%    Stephanie Middleton is a pleasant 89 y.o. female in NAD. AAO x 3.  Vascular Examination: Capillary refill time <3 seconds b/l LE. Palpable pedal pulses b/l LE. Digital hair absent b/l. No pedal edema b/l. Skin temperature gradient WNL b/l. No varicosities b/l. No pain with calf compression b/l.Marland Kitchen  Dermatological Examination: Pedal skin is warm and supple b/l LE. No open wounds b/l LE. No interdigital macerations noted b/l LE. Toenails 1-5 b/l elongated, discolored, dystrophic, thickened, crumbly with subungual debris and tenderness to dorsal palpation..  Neurological Examination: Protective sensation intact with 10 gram monofilament b/l LE. Vibratory sensation intact b/l LE.   Musculoskeletal Examination: Normal muscle strength 5/5 to all lower extremity muscle groups bilaterally. HAV with bunion deformity noted b/l LE. Hammertoe deformity noted 2-5 b/l.Marland Kitchen No pain, crepitus or joint limitation noted with ROM b/l LE.  Patient ambulates independently without assistive aids.     Latest Ref Rng & Units 07/23/2022    2:21 PM  Hemoglobin A1C  Hemoglobin-A1c 4.8 - 5.6 % 6.4    Assessment/Plan: 1. Pain due to onychomycosis of  toenails of both feet   2. Type 2 diabetes mellitus with diabetic nephropathy, with long-term current use of insulin (HCC)   -Consent given for treatment as described below: -Examined patient. -Continue foot and shoe inspections daily. Monitor blood glucose per PCP/Endocrinologist's recommendations. -Toenails 1-5 b/l were debrided in length and girth with sterile nail nippers and dremel without iatrogenic bleeding.  -Dispensed tube foam for left bunion. Apply to left foot every morning. Remove every evening. -Patient/POA to call should there be question/concern in the interim.   Return in about 9 weeks (around 05/18/2023).  Freddie Breech, DPM

## 2023-03-21 ENCOUNTER — Other Ambulatory Visit: Payer: Self-pay | Admitting: Physical Medicine and Rehabilitation

## 2023-03-21 DIAGNOSIS — M5416 Radiculopathy, lumbar region: Secondary | ICD-10-CM

## 2023-03-25 DIAGNOSIS — E1122 Type 2 diabetes mellitus with diabetic chronic kidney disease: Secondary | ICD-10-CM | POA: Diagnosis not present

## 2023-04-01 ENCOUNTER — Encounter (HOSPITAL_COMMUNITY)
Admission: RE | Admit: 2023-04-01 | Discharge: 2023-04-01 | Disposition: A | Discharge status: Home / Self Care | Payer: Medicare PPO | Source: Ambulatory Visit | Attending: Nephrology | Admitting: Nephrology

## 2023-04-01 VITALS — BP 113/55 | HR 65 | Temp 98.6°F | Resp 16

## 2023-04-01 DIAGNOSIS — N184 Chronic kidney disease, stage 4 (severe): Secondary | ICD-10-CM

## 2023-04-01 DIAGNOSIS — D631 Anemia in chronic kidney disease: Secondary | ICD-10-CM | POA: Diagnosis not present

## 2023-04-01 LAB — CBC WITH DIFFERENTIAL/PLATELET
Abs Immature Granulocytes: 0.02 10*3/uL (ref 0.00–0.07)
Basophils Absolute: 0 10*3/uL (ref 0.0–0.1)
Basophils Relative: 1 %
Eosinophils Absolute: 0.2 10*3/uL (ref 0.0–0.5)
Eosinophils Relative: 3 %
HCT: 30.1 % — ABNORMAL LOW (ref 36.0–46.0)
Hemoglobin: 9.2 g/dL — ABNORMAL LOW (ref 12.0–15.0)
Immature Granulocytes: 0 %
Lymphocytes Relative: 21 %
Lymphs Abs: 1 10*3/uL (ref 0.7–4.0)
MCH: 30.4 pg (ref 26.0–34.0)
MCHC: 30.6 g/dL (ref 30.0–36.0)
MCV: 99.3 fL (ref 80.0–100.0)
Monocytes Absolute: 0.4 10*3/uL (ref 0.1–1.0)
Monocytes Relative: 8 %
Neutro Abs: 3.4 10*3/uL (ref 1.7–7.7)
Neutrophils Relative %: 67 %
Platelets: 197 10*3/uL (ref 150–400)
RBC: 3.03 MIL/uL — ABNORMAL LOW (ref 3.87–5.11)
RDW: 15.2 % (ref 11.5–15.5)
WBC: 5.1 10*3/uL (ref 4.0–10.5)
nRBC: 0 % (ref 0.0–0.2)

## 2023-04-01 LAB — RENAL FUNCTION PANEL
Albumin: 4.3 g/dL (ref 3.5–5.0)
Anion gap: 14 (ref 5–15)
BUN: 76 mg/dL — ABNORMAL HIGH (ref 8–23)
CO2: 24 mmol/L (ref 22–32)
Calcium: 9.5 mg/dL (ref 8.9–10.3)
Chloride: 101 mmol/L (ref 98–111)
Creatinine, Ser: 3.82 mg/dL — ABNORMAL HIGH (ref 0.44–1.00)
GFR, Estimated: 11 mL/min — ABNORMAL LOW (ref >60)
Glucose, Bld: 108 mg/dL — ABNORMAL HIGH (ref 70–99)
Phosphorus: 4.6 mg/dL (ref 2.5–4.6)
Potassium: 4.1 mmol/L (ref 3.5–5.1)
Sodium: 139 mmol/L (ref 135–145)

## 2023-04-01 LAB — PROTEIN / CREATININE RATIO, URINE
Creatinine, Urine: 124 mg/dL
Protein Creatinine Ratio: 0.31 mg/mg{Cre} — ABNORMAL HIGH (ref 0.00–0.15)
Total Protein, Urine: 38 mg/dL

## 2023-04-01 LAB — POCT HEMOGLOBIN-HEMACUE: Hemoglobin: 9.2 g/dL — ABNORMAL LOW (ref 12.0–15.0)

## 2023-04-01 MED ORDER — EPOETIN ALFA-EPBX 10000 UNIT/ML IJ SOLN
8000.0000 [IU] | Freq: Once | INTRAMUSCULAR | Status: AC
  Administered 2023-04-01: 8000 [IU] via SUBCUTANEOUS
  Filled 2023-04-01: qty 1

## 2023-04-01 NOTE — Progress Notes (Signed)
Diagnosis: Anemia in Chronic Kidney Disease  Provider:  Celso Amy MD  Procedure: Injection  Retacrit (epoetin alfa-epbx), Dose: 8000 Units, Site: subcutaneous, Number of injections: 1  Hgb  9.2  Post Care: Patient declined observation  Discharge: Condition: Good, Destination: Home . AVS Declined.  Performed by:  Wyvonne Lenz, RN

## 2023-04-01 NOTE — Addendum Note (Signed)
Encounter addended by: Arrie Senate, RN on: 04/01/2023 10:44 AM  Actions taken: Therapy plan modified

## 2023-04-01 NOTE — Addendum Note (Signed)
Encounter addended by: Wyvonne Lenz, RN on: 04/01/2023 2:31 PM  Actions taken: Order list changed, Diagnosis association updated

## 2023-04-15 ENCOUNTER — Encounter (HOSPITAL_COMMUNITY)
Admission: RE | Admit: 2023-04-15 | Discharge: 2023-04-15 | Disposition: A | Discharge status: Home / Self Care | Payer: Medicare PPO | Source: Ambulatory Visit | Attending: Nephrology | Admitting: Nephrology

## 2023-04-15 VITALS — BP 143/59 | HR 56 | Temp 98.5°F | Resp 18

## 2023-04-15 DIAGNOSIS — D631 Anemia in chronic kidney disease: Secondary | ICD-10-CM | POA: Diagnosis not present

## 2023-04-15 DIAGNOSIS — N184 Chronic kidney disease, stage 4 (severe): Secondary | ICD-10-CM | POA: Diagnosis not present

## 2023-04-15 LAB — POCT HEMOGLOBIN-HEMACUE: Hemoglobin: 8.9 g/dL — ABNORMAL LOW (ref 12.0–15.0)

## 2023-04-15 MED ORDER — EPOETIN ALFA-EPBX 10000 UNIT/ML IJ SOLN
8000.0000 [IU] | Freq: Once | INTRAMUSCULAR | Status: AC
  Administered 2023-04-15: 8000 [IU] via SUBCUTANEOUS

## 2023-04-15 NOTE — Progress Notes (Signed)
Diagnosis: Anemia in Chronic Kidney Disease  Provider:  Celso Amy MD  Procedure: Injection  Retacrit (epoetin alfa-epbx), Dose: 8000 Units, Site: subcutaneous, Number of injections: 1  Hgb  8.9  Post Care: Patient declined observation  Discharge: Condition: Good, Destination: Home . AVS Declined.  Performed by:  Arrie Senate, RN

## 2023-04-16 DIAGNOSIS — E1122 Type 2 diabetes mellitus with diabetic chronic kidney disease: Secondary | ICD-10-CM | POA: Diagnosis not present

## 2023-04-16 DIAGNOSIS — N179 Acute kidney failure, unspecified: Secondary | ICD-10-CM | POA: Diagnosis not present

## 2023-04-16 DIAGNOSIS — R809 Proteinuria, unspecified: Secondary | ICD-10-CM | POA: Diagnosis not present

## 2023-04-16 DIAGNOSIS — N184 Chronic kidney disease, stage 4 (severe): Secondary | ICD-10-CM | POA: Diagnosis not present

## 2023-04-21 ENCOUNTER — Other Ambulatory Visit: Payer: Self-pay

## 2023-04-21 ENCOUNTER — Ambulatory Visit: Payer: Medicare PPO | Admitting: Physical Medicine and Rehabilitation

## 2023-04-21 VITALS — BP 129/60 | HR 60

## 2023-04-21 DIAGNOSIS — M5416 Radiculopathy, lumbar region: Secondary | ICD-10-CM

## 2023-04-21 MED ORDER — METHYLPREDNISOLONE ACETATE 80 MG/ML IJ SUSP
80.0000 mg | Freq: Once | INTRAMUSCULAR | Status: AC
Start: 2023-04-21 — End: 2023-04-21
  Administered 2023-04-21: 80 mg

## 2023-04-21 NOTE — Procedures (Signed)
Lumbosacral Transforaminal Epidural Steroid Injection - Sub-Pedicular Approach with Fluoroscopic Guidance  Patient: Stephanie Middleton      Date of Birth: 06/16/1931 MRN: [id number] PCP: [name], MD      Visit Date: 04/21/2023   Universal Protocol:    Date/Time: 04/21/2023  Consent Given By: the patient  Position: PRONE  Additional Comments: Vital signs were monitored before and after the procedure. Patient was prepped and draped in the usual sterile fashion. The correct patient, procedure, and site was verified.   Injection Procedure Details:   Procedure diagnoses: Lumbar radiculopathy [M54.16]    Meds Administered:  Meds ordered this encounter  Medications   methylPREDNISolone acetate (DEPO-MEDROL) injection 80 mg    Laterality: Right  Location/Site: L5  Needle:5.0 in., 22 ga.  Short bevel or Quincke spinal needle  Needle Placement: Transforaminal  Findings:    -Comments: Excellent flow of contrast along the nerve, nerve root and into the epidural space.  Procedure Details: After squaring off the end-plates to get a true AP view, the C-arm was positioned so that an oblique view of the foramen as noted above was visualized. The target area is just inferior to the "nose of the scotty dog" or sub pedicular. The soft tissues overlying this structure were infiltrated with 2-3 ml. of 1% Lidocaine without Epinephrine.  The spinal needle was inserted toward the target using a "trajectory" view along the fluoroscope beam.  Under AP and lateral visualization, the needle was advanced so it did not puncture dura and was located close the 6 O'Clock position of the pedical in AP tracterory. Biplanar projections were used to confirm position. Aspiration was confirmed to be negative for CSF and/or blood. A 1-2 ml. volume of Isovue-250 was injected and flow of contrast was noted at each level. Radiographs were obtained for documentation purposes.   After attaining the desired flow  of contrast documented above, a 0.5 to 1.0 ml test dose of 0.25% Marcaine was injected into each respective transforaminal space.  The patient was observed for 90 seconds post injection.  After no sensory deficits were reported, and normal lower extremity motor function was noted,   the above injectate was administered so that equal amounts of the injectate were placed at each foramen (level) into the transforaminal epidural space.   Additional Comments:  The patient tolerated the procedure well Dressing: 2 x 2 sterile gauze and Band-Aid    Post-procedure details: Patient was observed during the procedure. Post-procedure instructions were reviewed.  Patient left the clinic in stable condition.

## 2023-04-21 NOTE — Progress Notes (Signed)
Right  L5 transformanal esi  Functional Pain Scale - descriptive words and definitions  Unmanageable (7)  Pain interferes with normal ADL's/nothing seems to help/sleep is very difficult/active distractions are very difficult to concentrate on. Severe range order  Average Pain 7   +Driver, -BT, -Dye Allergies.

## 2023-04-21 NOTE — Progress Notes (Signed)
Stephanie Middleton - 89 y.o. female MRN [id number]  Date of birth: Mar 05, 1931  Office Visit Note: Visit Date: 04/21/2023 PCP: [name], MD Referred by: [name], MD  Subjective: Chief Complaint  Patient presents with   Lower Back - Pain, Numbness   HPI:  Stephanie Middleton is a 89 y.o. female who comes in today at the request of Ellin Goodie, FNP for planned Right L5-S1 Lumbar Transforaminal epidural steroid injection with fluoroscopic guidance.  The patient has failed conservative care including home exercise, medications, time and activity modification.  This injection will be diagnostic and hopefully therapeutic.  Please see requesting physician notes for further details and justification.   ROS Otherwise per HPI.  Assessment & Plan: Visit Diagnoses:    ICD-10-CM   1. Lumbar radiculopathy  M54.16 XR C-ARM NO REPORT    Epidural Steroid injection    methylPREDNISolone acetate (DEPO-MEDROL) injection 80 mg      Plan: No additional findings.   Meds & Orders:  Meds ordered this encounter  Medications   methylPREDNISolone acetate (DEPO-MEDROL) injection 80 mg    Orders Placed This Encounter  Procedures   XR C-ARM NO REPORT   Epidural Steroid injection    Follow-up: Return if symptoms worsen or fail to improve.   Procedures: No procedures performed  Lumbosacral Transforaminal Epidural Steroid Injection - Sub-Pedicular Approach with Fluoroscopic Guidance  Patient: Stephanie Middleton      Date of Birth: Aug 12, 1931 MRN: [id number] PCP: [name], MD      Visit Date: 04/21/2023   Universal Protocol:    Date/Time: 04/21/2023  Consent Given By: the patient  Position: PRONE  Additional Comments: Vital signs were monitored before and after the procedure. Patient was prepped and draped in the usual sterile fashion. The correct patient, procedure, and site was verified.   Injection Procedure Details:   Procedure diagnoses: Lumbar radiculopathy [M54.16]     Meds Administered:  Meds ordered this encounter  Medications   methylPREDNISolone acetate (DEPO-MEDROL) injection 80 mg    Laterality: Right  Location/Site: L5  Needle:5.0 in., 22 ga.  Short bevel or Quincke spinal needle  Needle Placement: Transforaminal  Findings:    -Comments: Excellent flow of contrast along the nerve, nerve root and into the epidural space.  Procedure Details: After squaring off the end-plates to get a true AP view, the C-arm was positioned so that an oblique view of the foramen as noted above was visualized. The target area is just inferior to the "nose of the scotty dog" or sub pedicular. The soft tissues overlying this structure were infiltrated with 2-3 ml. of 1% Lidocaine without Epinephrine.  The spinal needle was inserted toward the target using a "trajectory" view along the fluoroscope beam.  Under AP and lateral visualization, the needle was advanced so it did not puncture dura and was located close the 6 O'Clock position of the pedical in AP tracterory. Biplanar projections were used to confirm position. Aspiration was confirmed to be negative for CSF and/or blood. A 1-2 ml. volume of Isovue-250 was injected and flow of contrast was noted at each level. Radiographs were obtained for documentation purposes.   After attaining the desired flow of contrast documented above, a 0.5 to 1.0 ml test dose of 0.25% Marcaine was injected into each respective transforaminal space.  The patient was observed for 90 seconds post injection.  After no sensory deficits were reported, and normal lower extremity motor function was noted,   the above injectate was administered  so that equal amounts of the injectate were placed at each foramen (level) into the transforaminal epidural space.   Additional Comments:  The patient tolerated the procedure well Dressing: 2 x 2 sterile gauze and Band-Aid    Post-procedure details: Patient was observed during the  procedure. Post-procedure instructions were reviewed.  Patient left the clinic in stable condition.    Clinical History: EXAM: MRI LUMBAR SPINE WITHOUT CONTRAST   TECHNIQUE: Multiplanar, multisequence MR imaging of the lumbar spine was performed. No intravenous contrast was administered.   COMPARISON:  Lumbar spine MRI 05/26/2016   FINDINGS: The axial T2 sequence is moderately to severely motion degraded.   Segmentation: Standard.   Alignment: Unchanged 11 mm anterolisthesis of L4 on L5. Mild lower lumbar levoscoliosis.   Vertebrae: Mild chronic T11 compression fracture. Unchanged L3 inferior endplate Schmorl's node. No new fracture, suspicious marrow lesion, or significant marrow edema.   Conus medullaris and cauda equina: Conus extends to the L1 level. Conus and cauda equina appear normal.   Paraspinal and other soft tissues: Unremarkable.   Disc levels:   T12-L1: Negative.   L1-2: Disc desiccation. Minimal disc bulging without stenosis, unchanged.   L2-3: Minimal leftward disc bulging and mild facet and ligamentum flavum hypertrophy without stenosis, unchanged.   L3-4: Disc desiccation. Disc bulging, a left foraminal disc protrusion, and moderate facet and ligamentum flavum hypertrophy result in mild-to-moderate left neural foraminal stenosis without spinal stenosis, unchanged.   L4-5: Disc desiccation and severe disc space narrowing. Possible prior right laminotomy. Anterolisthesis with bulging uncovered disc, left ligamentum flavum hypertrophy, and severe facet arthrosis result in severe spinal stenosis and severe right and mild left neural foraminal stenosis, overall mildly progressed.   L5-S1: Prior right laminotomy. Minimal disc bulging and mild facet and ligamentum flavum hypertrophy result in borderline to mild left neural foraminal stenosis without spinal stenosis, unchanged.   IMPRESSION: 1. Severe L4-5 disc and facet degeneration with grade  2 anterolisthesis, severe spinal stenosis, and severe right neural foraminal stenosis, mildly progressed from 2017. 2. Unchanged disc and facet degeneration elsewhere as above.     Electronically Signed   By: Sebastian Ache M.D.   On: 06/02/2022 15:24     Objective:  VS:  HT:    WT:   BMI:     BP:129/60  HR:60bpm  TEMP: ( )  RESP:  Physical Exam Vitals and nursing note reviewed.  Constitutional:      General: She is not in acute distress.    Appearance: Normal appearance. She is not ill-appearing.  HENT:     Head: Normocephalic and atraumatic.     Right Ear: External ear normal.     Left Ear: External ear normal.  Eyes:     Extraocular Movements: Extraocular movements intact.  Cardiovascular:     Rate and Rhythm: Normal rate.     Pulses: Normal pulses.  Pulmonary:     Effort: Pulmonary effort is normal. No respiratory distress.  Abdominal:     General: There is no distension.     Palpations: Abdomen is soft.  Musculoskeletal:        General: Tenderness present.     Cervical back: Neck supple.     Right lower leg: No edema.     Left lower leg: No edema.     Comments: Patient has good distal strength with no pain over the greater trochanters.  No clonus or focal weakness.  Skin:    Findings: No erythema, lesion or rash.  Neurological:  General: No focal deficit present.     Mental Status: She is alert and oriented to person, place, and time.     Sensory: No sensory deficit.     Motor: No weakness or abnormal muscle tone.     Coordination: Coordination normal.  Psychiatric:        Mood and Affect: Mood normal.        Behavior: Behavior normal.      Imaging: No results found.

## 2023-04-21 NOTE — Patient Instructions (Signed)

## 2023-04-27 DIAGNOSIS — E1122 Type 2 diabetes mellitus with diabetic chronic kidney disease: Secondary | ICD-10-CM | POA: Diagnosis not present

## 2023-04-27 DIAGNOSIS — N3281 Overactive bladder: Secondary | ICD-10-CM | POA: Diagnosis not present

## 2023-04-27 DIAGNOSIS — R001 Bradycardia, unspecified: Secondary | ICD-10-CM | POA: Diagnosis not present

## 2023-04-27 DIAGNOSIS — N184 Chronic kidney disease, stage 4 (severe): Secondary | ICD-10-CM | POA: Diagnosis not present

## 2023-04-27 DIAGNOSIS — M150 Primary generalized (osteo)arthritis: Secondary | ICD-10-CM | POA: Diagnosis not present

## 2023-04-27 DIAGNOSIS — I10 Essential (primary) hypertension: Secondary | ICD-10-CM | POA: Diagnosis not present

## 2023-04-29 ENCOUNTER — Encounter (HOSPITAL_COMMUNITY)
Admission: RE | Admit: 2023-04-29 | Discharge: 2023-04-29 | Disposition: A | Discharge status: Home / Self Care | Payer: Medicare PPO | Source: Ambulatory Visit | Attending: Nephrology | Admitting: Nephrology

## 2023-04-29 VITALS — BP 138/58 | HR 58 | Temp 98.4°F | Resp 18

## 2023-04-29 DIAGNOSIS — D631 Anemia in chronic kidney disease: Secondary | ICD-10-CM

## 2023-04-29 DIAGNOSIS — N184 Chronic kidney disease, stage 4 (severe): Secondary | ICD-10-CM | POA: Diagnosis not present

## 2023-04-29 LAB — RENAL FUNCTION PANEL
Albumin: 4.4 g/dL (ref 3.5–5.0)
Anion gap: 11 (ref 5–15)
BUN: 71 mg/dL — ABNORMAL HIGH (ref 8–23)
CO2: 23 mmol/L (ref 22–32)
Calcium: 9.4 mg/dL (ref 8.9–10.3)
Chloride: 103 mmol/L (ref 98–111)
Creatinine, Ser: 3.59 mg/dL — ABNORMAL HIGH (ref 0.44–1.00)
GFR, Estimated: 12 mL/min — ABNORMAL LOW (ref >60)
Glucose, Bld: 102 mg/dL — ABNORMAL HIGH (ref 70–99)
Phosphorus: 4.7 mg/dL — ABNORMAL HIGH (ref 2.5–4.6)
Potassium: 4.5 mmol/L (ref 3.5–5.1)
Sodium: 137 mmol/L (ref 135–145)

## 2023-04-29 LAB — CBC WITH DIFFERENTIAL/PLATELET
Abs Immature Granulocytes: 0.02 10*3/uL (ref 0.00–0.07)
Basophils Absolute: 0 10*3/uL (ref 0.0–0.1)
Basophils Relative: 1 %
Eosinophils Absolute: 0.1 10*3/uL (ref 0.0–0.5)
Eosinophils Relative: 3 %
HCT: 29.8 % — ABNORMAL LOW (ref 36.0–46.0)
Hemoglobin: 9.2 g/dL — ABNORMAL LOW (ref 12.0–15.0)
Immature Granulocytes: 0 %
Lymphocytes Relative: 22 %
Lymphs Abs: 1.1 10*3/uL (ref 0.7–4.0)
MCH: 30.8 pg (ref 26.0–34.0)
MCHC: 30.9 g/dL (ref 30.0–36.0)
MCV: 99.7 fL (ref 80.0–100.0)
Monocytes Absolute: 0.4 10*3/uL (ref 0.1–1.0)
Monocytes Relative: 8 %
Neutro Abs: 3.3 10*3/uL (ref 1.7–7.7)
Neutrophils Relative %: 66 %
Platelets: 205 10*3/uL (ref 150–400)
RBC: 2.99 MIL/uL — ABNORMAL LOW (ref 3.87–5.11)
RDW: 15.7 % — ABNORMAL HIGH (ref 11.5–15.5)
WBC: 5 10*3/uL (ref 4.0–10.5)
nRBC: 0 % (ref 0.0–0.2)

## 2023-04-29 LAB — PROTEIN / CREATININE RATIO, URINE
Creatinine, Urine: 58 mg/dL
Protein Creatinine Ratio: 0.55 mg/mg{Cre} — ABNORMAL HIGH (ref 0.00–0.15)
Total Protein, Urine: 32 mg/dL

## 2023-04-29 LAB — POCT HEMOGLOBIN-HEMACUE: Hemoglobin: 9.7 g/dL — ABNORMAL LOW (ref 12.0–15.0)

## 2023-04-29 MED ORDER — EPOETIN ALFA-EPBX 10000 UNIT/ML IJ SOLN
8000.0000 [IU] | Freq: Once | INTRAMUSCULAR | Status: AC
  Administered 2023-04-29: 8000 [IU] via SUBCUTANEOUS

## 2023-04-29 NOTE — Progress Notes (Signed)
Diagnosis: Anemia in Chronic Kidney Disease  Provider:  Celso Amy MD  Procedure: Injection  Retacrit (epoetin alfa-epbx), Dose: 8000 Units, Site: subcutaneous, Number of injections: 1  Hgb. 9.7  Post Care: Observation period completed  Discharge: Condition: Good, Destination: Home . AVS Provided  Performed by:  Daleen Squibb, RN

## 2023-04-30 DIAGNOSIS — E669 Obesity, unspecified: Secondary | ICD-10-CM | POA: Diagnosis not present

## 2023-04-30 DIAGNOSIS — M1990 Unspecified osteoarthritis, unspecified site: Secondary | ICD-10-CM | POA: Diagnosis not present

## 2023-04-30 DIAGNOSIS — E785 Hyperlipidemia, unspecified: Secondary | ICD-10-CM | POA: Diagnosis not present

## 2023-04-30 DIAGNOSIS — E1142 Type 2 diabetes mellitus with diabetic polyneuropathy: Secondary | ICD-10-CM | POA: Diagnosis not present

## 2023-04-30 DIAGNOSIS — I509 Heart failure, unspecified: Secondary | ICD-10-CM | POA: Diagnosis not present

## 2023-04-30 DIAGNOSIS — M810 Age-related osteoporosis without current pathological fracture: Secondary | ICD-10-CM | POA: Diagnosis not present

## 2023-04-30 DIAGNOSIS — M5440 Lumbago with sciatica, unspecified side: Secondary | ICD-10-CM | POA: Diagnosis not present

## 2023-04-30 DIAGNOSIS — R32 Unspecified urinary incontinence: Secondary | ICD-10-CM | POA: Diagnosis not present

## 2023-04-30 DIAGNOSIS — K219 Gastro-esophageal reflux disease without esophagitis: Secondary | ICD-10-CM | POA: Diagnosis not present

## 2023-05-10 ENCOUNTER — Other Ambulatory Visit: Payer: Self-pay

## 2023-05-11 DIAGNOSIS — N179 Acute kidney failure, unspecified: Secondary | ICD-10-CM | POA: Diagnosis not present

## 2023-05-11 DIAGNOSIS — N2581 Secondary hyperparathyroidism of renal origin: Secondary | ICD-10-CM | POA: Diagnosis not present

## 2023-05-11 DIAGNOSIS — D631 Anemia in chronic kidney disease: Secondary | ICD-10-CM | POA: Diagnosis not present

## 2023-05-11 DIAGNOSIS — N185 Chronic kidney disease, stage 5: Secondary | ICD-10-CM | POA: Diagnosis not present

## 2023-05-12 ENCOUNTER — Encounter: Payer: Medicare PPO | Attending: Nephrology | Admitting: *Deleted

## 2023-05-12 ENCOUNTER — Telehealth: Payer: Self-pay | Admitting: Pharmacy Technician

## 2023-05-12 VITALS — BP 151/66 | HR 69 | Temp 98.5°F | Resp 18

## 2023-05-12 DIAGNOSIS — N189 Chronic kidney disease, unspecified: Secondary | ICD-10-CM | POA: Insufficient documentation

## 2023-05-12 DIAGNOSIS — N184 Chronic kidney disease, stage 4 (severe): Secondary | ICD-10-CM | POA: Diagnosis not present

## 2023-05-12 DIAGNOSIS — D631 Anemia in chronic kidney disease: Secondary | ICD-10-CM | POA: Diagnosis not present

## 2023-05-12 MED ORDER — EPOETIN ALFA-EPBX 10000 UNIT/ML IJ SOLN
8000.0000 [IU] | Freq: Once | INTRAMUSCULAR | Status: AC
  Administered 2023-05-12: 8000 [IU] via SUBCUTANEOUS
  Filled 2023-05-12: qty 1

## 2023-05-12 NOTE — Progress Notes (Signed)
Diagnosis: Anemia in Chronic Kidney Disease  Provider:  Celso Amy MD  Procedure: Injection  Retacrit (epoetin alfa-epbx), Dose: 8000 Units, Site: subcutaneous, Number of injections: 1  Hemoglobin 8.9  Post Care: Patient declined observation  Discharge: Condition: Good, Destination: Home . AVS Provided  Performed by:  Daleen Squibb, RN

## 2023-05-12 NOTE — Telephone Encounter (Signed)
Auth Submission: APPROVED Site of care: Site of care: [facility] Payer: [employer] MEDICARE Medication & CPT/J Code(s) submitted:  RETACRIT [id number] Route of submission (phone, fax, portal): PORTAL Phone # Fax # Auth type: Buy/Bill PB Units/visits requested: 8000 UNITS Q2WKS Reference number: [id number] Approval from: 05/10/23 to 10/05/23

## 2023-05-13 ENCOUNTER — Ambulatory Visit: Payer: Medicare PPO

## 2023-05-13 ENCOUNTER — Inpatient Hospital Stay (HOSPITAL_COMMUNITY): Admission: RE | Admit: 2023-05-13 | Payer: Medicare PPO | Source: Ambulatory Visit

## 2023-05-21 ENCOUNTER — Ambulatory Visit: Payer: Medicare PPO | Admitting: Podiatry

## 2023-05-24 ENCOUNTER — Ambulatory Visit: Payer: Medicare PPO | Admitting: Podiatry

## 2023-05-24 ENCOUNTER — Encounter: Payer: Self-pay | Admitting: Podiatry

## 2023-05-24 DIAGNOSIS — M79675 Pain in left toe(s): Secondary | ICD-10-CM

## 2023-05-24 DIAGNOSIS — B351 Tinea unguium: Secondary | ICD-10-CM | POA: Diagnosis not present

## 2023-05-24 DIAGNOSIS — M79674 Pain in right toe(s): Secondary | ICD-10-CM

## 2023-05-24 DIAGNOSIS — E1121 Type 2 diabetes mellitus with diabetic nephropathy: Secondary | ICD-10-CM | POA: Diagnosis not present

## 2023-05-24 DIAGNOSIS — Z794 Long term (current) use of insulin: Secondary | ICD-10-CM | POA: Diagnosis not present

## 2023-05-27 ENCOUNTER — Ambulatory Visit (HOSPITAL_COMMUNITY): Payer: Medicare PPO

## 2023-05-27 ENCOUNTER — Encounter (INDEPENDENT_AMBULATORY_CARE_PROVIDER_SITE_OTHER): Payer: Medicare PPO | Admitting: Internal Medicine

## 2023-05-27 VITALS — BP 172/65 | HR 64 | Temp 98.7°F | Resp 18

## 2023-05-27 DIAGNOSIS — N184 Chronic kidney disease, stage 4 (severe): Secondary | ICD-10-CM | POA: Diagnosis not present

## 2023-05-27 DIAGNOSIS — D631 Anemia in chronic kidney disease: Secondary | ICD-10-CM

## 2023-05-27 MED ORDER — EPOETIN ALFA-EPBX 10000 UNIT/ML IJ SOLN
8000.0000 [IU] | Freq: Once | INTRAMUSCULAR | Status: AC
  Administered 2023-05-27: 8000 [IU] via SUBCUTANEOUS
  Filled 2023-05-27: qty 1

## 2023-05-27 NOTE — Progress Notes (Signed)
Diagnosis: Anemia in Chronic Kidney Disease  Provider:  Alla German, MD  Procedure: Injection  Retacrit (epoetin alfa-epbx), Dose: 8000 Units, Site: subcutaneous, Number of injections: 1  Hemoglobin 8.4   Post Care: Observation period completed  Discharge: Condition: Good, Destination: Home . AVS Provided  Performed by:  Feliberto Harts, LPN

## 2023-05-27 NOTE — Progress Notes (Signed)
  Subjective:  Patient ID: Stephanie Middleton, female    DOB: 1931/04/28,  MRN: [id number]  Stephanie Middleton presents to clinic today for at risk foot care. Pt has h/o NIDDM with chronic kidney disease and painful thick toenails that are difficult to trim. Pain interferes with ambulation. Aggravating factors include wearing enclosed shoe gear. Pain is relieved with periodic professional debridement. She is accompanied by her daughter on today's visit. Chief Complaint  Patient presents with   Nail Problem    DFC,A1C:?- 3 weeks ago,Referring Provider [name], MD,lov:a few weeks ago, BS:      New problem(s): None.   PCP is [name], MD.  No Known Allergies  Review of Systems: Negative except as noted in the HPI.  Objective: No changes noted in today's physical examination. There were no vitals filed for this visit. Stephanie Middleton is a pleasant 89 y.o. female WD, WN in NAD. AAO x 3.  Vascular Examination: Capillary refill time <3 seconds b/l LE. Palpable pedal pulses b/l LE. Digital hair absent b/l. No pedal edema b/l. Skin temperature gradient WNL b/l. No varicosities b/l. No pain with calf compression b/l.Marland Kitchen  Dermatological Examination: Pedal skin is warm and supple b/l LE. No open wounds b/l LE. No interdigital macerations noted b/l LE. Toenails 1-5 b/l elongated, discolored, dystrophic, thickened, crumbly with subungual debris and tenderness to dorsal palpation..  Neurological Examination: Protective sensation intact with 10 gram monofilament b/l LE. Vibratory sensation intact b/l LE.   Musculoskeletal Examination: Normal muscle strength 5/5 to all lower extremity muscle groups bilaterally. HAV with bunion deformity noted b/l LE. Hammertoe deformity noted 2-5 b/l. No pain, crepitus or joint limitation noted with ROM b/l LE.  Patient ambulates independently without assistive aids.  Assessment/Plan: 1. Pain due to onychomycosis of toenails of both feet   2. Type 2 diabetes  mellitus with diabetic nephropathy, with long-term current use of insulin (HCC)     -Patient was evaluated and treated. All patient's and/or POA's questions/concerns answered on today's visit. -Mycotic toenails 1-5 bilaterally were debrided in length and girth with sterile nail nippers and dremel without incident. -Dispensed tube foam. Apply to left bunion every morning. Remove every evening. -Patient/POA to call should there be question/concern in the interim.   Return in about 9 weeks (around 07/26/2023).  Freddie Breech, DPM

## 2023-06-09 ENCOUNTER — Encounter: Payer: Self-pay | Admitting: Nephrology

## 2023-06-09 NOTE — Addendum Note (Signed)
Addended by: Marin Shutter E on: 06/09/2023 02:20 PM   Modules accepted: Orders

## 2023-06-10 ENCOUNTER — Ambulatory Visit (HOSPITAL_COMMUNITY): Payer: Medicare PPO

## 2023-06-10 ENCOUNTER — Encounter: Payer: Medicare PPO | Attending: Nephrology | Admitting: *Deleted

## 2023-06-10 VITALS — BP 160/66 | HR 64 | Temp 98.5°F | Resp 18

## 2023-06-10 DIAGNOSIS — N189 Chronic kidney disease, unspecified: Secondary | ICD-10-CM | POA: Diagnosis not present

## 2023-06-10 DIAGNOSIS — D631 Anemia in chronic kidney disease: Secondary | ICD-10-CM | POA: Insufficient documentation

## 2023-06-10 LAB — GLUCOSE HEMOCUE WAIVED: Hemoglobin: 9.3

## 2023-06-10 MED ORDER — EPOETIN ALFA-EPBX 10000 UNIT/ML IJ SOLN
8000.0000 [IU] | Freq: Once | INTRAMUSCULAR | Status: AC
  Administered 2023-06-10: 8000 [IU] via SUBCUTANEOUS

## 2023-06-10 NOTE — Progress Notes (Signed)
Diagnosis: Anemia in Chronic Kidney Disease  Provider:  Celso Amy MD  Procedure: Injection  Retacrit (epoetin alfa-epbx), Dose: 8000 Units, Site: subcutaneous, Number of injections: 1  Hgb. 9.3  Post Care: Observation period completed  Discharge: Condition: Good, Destination: Home . AVS Provided  Performed by:  Daleen Squibb, RN

## 2023-06-24 ENCOUNTER — Ambulatory Visit (HOSPITAL_COMMUNITY): Payer: Medicare PPO

## 2023-06-24 VITALS — BP 153/60 | HR 66 | Temp 98.7°F | Resp 18

## 2023-06-24 DIAGNOSIS — D631 Anemia in chronic kidney disease: Secondary | ICD-10-CM

## 2023-06-24 DIAGNOSIS — N184 Chronic kidney disease, stage 4 (severe): Secondary | ICD-10-CM

## 2023-06-24 LAB — GLUCOSE HEMOCUE WAIVED: Hemoglobin: 8.5

## 2023-06-24 MED ORDER — EPOETIN ALFA-EPBX 10000 UNIT/ML IJ SOLN
8000.0000 [IU] | Freq: Once | INTRAMUSCULAR | Status: AC
  Administered 2023-06-24: 8000 [IU] via SUBCUTANEOUS

## 2023-06-24 NOTE — Progress Notes (Addendum)
Diagnosis: Anemia in Chronic Kidney Disease  Provider:  Celso Amy MD   Procedure: Injection  Epogen (epoetin alfa), Dose: 8000 Units, Site: subcutaneous,    Abdomen                        Number of injections: 1          Hgb  8.5    Post Care: Patient declined observation  Discharge: Condition: Good, Destination: Home . AVS Provided  Performed by:  Marlow Baars [place], RN

## 2023-07-08 ENCOUNTER — Encounter: Payer: Medicare PPO | Attending: Nephrology | Admitting: *Deleted

## 2023-07-08 ENCOUNTER — Ambulatory Visit (HOSPITAL_COMMUNITY): Payer: Medicare PPO

## 2023-07-08 VITALS — BP 138/62 | HR 63 | Temp 98.5°F | Resp 18

## 2023-07-08 DIAGNOSIS — N189 Chronic kidney disease, unspecified: Secondary | ICD-10-CM

## 2023-07-08 DIAGNOSIS — D631 Anemia in chronic kidney disease: Secondary | ICD-10-CM

## 2023-07-08 LAB — GLUCOSE HEMOCUE WAIVED: Hemoglobin: 9.3

## 2023-07-08 MED ORDER — EPOETIN ALFA-EPBX 10000 UNIT/ML IJ SOLN
8000.0000 [IU] | Freq: Once | INTRAMUSCULAR | Status: AC
  Administered 2023-07-08: 8000 [IU] via SUBCUTANEOUS

## 2023-07-08 NOTE — Progress Notes (Signed)
Diagnosis: Anemia in Chronic Kidney Disease  Provider:  Celso Amy MD  Procedure: Injection  Retacrit (epoetin alfa-epbx), Dose: 8000 Units, Site: subcutaneous, Number of injections: 1  Hgb. 9.3  Post Care: Observation period completed  Discharge: Condition: Good, Destination: Home . AVS Provided  Performed by:  Daleen Squibb, RN

## 2023-07-22 ENCOUNTER — Other Ambulatory Visit: Payer: Self-pay | Admitting: Emergency Medicine

## 2023-07-22 ENCOUNTER — Encounter: Payer: Medicare PPO | Admitting: Internal Medicine

## 2023-07-22 ENCOUNTER — Ambulatory Visit (HOSPITAL_COMMUNITY): Payer: Medicare PPO

## 2023-07-22 VITALS — BP 155/60 | HR 65 | Temp 98.3°F | Resp 18

## 2023-07-22 DIAGNOSIS — D631 Anemia in chronic kidney disease: Secondary | ICD-10-CM

## 2023-07-22 DIAGNOSIS — N189 Chronic kidney disease, unspecified: Secondary | ICD-10-CM

## 2023-07-22 LAB — GLUCOSE HEMOCUE WAIVED: Hemoglobin: 9.3

## 2023-07-22 MED ORDER — EPOETIN ALFA-EPBX 10000 UNIT/ML IJ SOLN
8000.0000 [IU] | Freq: Once | INTRAMUSCULAR | Status: AC
  Administered 2023-07-22: 8000 [IU] via SUBCUTANEOUS
  Filled 2023-07-22: qty 1

## 2023-07-22 NOTE — Progress Notes (Signed)
Diagnosis: Iron Deficiency Anemia  Provider:  Celso Amy MD  Procedure: Injection  Retacrit (epoetin alfa-epbx), Dose: 8000 Units, Site: subcutaneous, Number of injections: 1  Post Care: Observation period completed  Discharge: Condition: Good, Destination: Home . AVS Provided  Performed by:  Feliberto Harts, LPN

## 2023-07-23 LAB — RENAL FUNCTION PANEL
Albumin: 4.5 g/dL (ref 3.6–5.1)
BUN/Creatinine Ratio: 19 (calc) (ref 6–22)
BUN: 66 mg/dL — ABNORMAL HIGH (ref 7–25)
CO2: 23 mmol/L (ref 20–32)
Calcium: 9.5 mg/dL (ref 8.6–10.4)
Chloride: 105 mmol/L (ref 98–110)
Creat: 3.47 mg/dL — ABNORMAL HIGH (ref 0.60–0.95)
Glucose, Bld: 154 mg/dL — ABNORMAL HIGH (ref 65–99)
Phosphorus: 4.4 mg/dL — ABNORMAL HIGH (ref 2.1–4.3)
Potassium: 4.5 mmol/L (ref 3.5–5.3)
Sodium: 143 mmol/L (ref 135–146)

## 2023-07-23 LAB — CBC WITH DIFFERENTIAL/PLATELET
Absolute Lymphocytes: 980 {cells}/uL (ref 850–3900)
Absolute Monocytes: 375 {cells}/uL (ref 200–950)
Basophils Absolute: 50 {cells}/uL (ref 0–200)
Basophils Relative: 1 %
Eosinophils Absolute: 150 {cells}/uL (ref 15–500)
Eosinophils Relative: 3 %
HCT: 28.5 % — ABNORMAL LOW (ref 35.0–45.0)
Hemoglobin: 8.9 g/dL — ABNORMAL LOW (ref 11.7–15.5)
MCH: 30.3 pg (ref 27.0–33.0)
MCHC: 31.2 g/dL — ABNORMAL LOW (ref 32.0–36.0)
MCV: 96.9 fL (ref 80.0–100.0)
MPV: 10 fL (ref 7.5–12.5)
Monocytes Relative: 7.5 %
Neutro Abs: 3445 {cells}/uL (ref 1500–7800)
Neutrophils Relative %: 68.9 %
Platelets: 216 10*3/uL (ref 140–400)
RBC: 2.94 10*6/uL — ABNORMAL LOW (ref 3.80–5.10)
RDW: 14.5 % (ref 11.0–15.0)
Total Lymphocyte: 19.6 %
WBC: 5 10*3/uL (ref 3.8–10.8)

## 2023-07-23 LAB — PROTEIN / CREATININE RATIO, URINE
Creatinine, Urine: 63 mg/dL (ref 20–275)
Protein/Creat Ratio: 556 mg/g{creat} — ABNORMAL HIGH (ref 24–184)
Protein/Creatinine Ratio: 0.556 mg/mg{creat} — ABNORMAL HIGH (ref 0.024–0.184)
Total Protein, Urine: 35 mg/dL — ABNORMAL HIGH (ref 5–24)

## 2023-07-26 ENCOUNTER — Telehealth: Payer: Self-pay | Admitting: Emergency Medicine

## 2023-07-26 ENCOUNTER — Encounter: Payer: Self-pay | Admitting: Podiatry

## 2023-07-26 ENCOUNTER — Ambulatory Visit: Payer: Medicare PPO | Admitting: Podiatry

## 2023-07-26 DIAGNOSIS — M79674 Pain in right toe(s): Secondary | ICD-10-CM | POA: Diagnosis not present

## 2023-07-26 DIAGNOSIS — B351 Tinea unguium: Secondary | ICD-10-CM

## 2023-07-26 DIAGNOSIS — E1121 Type 2 diabetes mellitus with diabetic nephropathy: Secondary | ICD-10-CM

## 2023-07-26 DIAGNOSIS — M79675 Pain in left toe(s): Secondary | ICD-10-CM

## 2023-07-26 DIAGNOSIS — Z794 Long term (current) use of insulin: Secondary | ICD-10-CM

## 2023-07-26 NOTE — Telephone Encounter (Cosign Needed)
Recent labs faxed to Dr [facility] office.

## 2023-07-27 DIAGNOSIS — M150 Primary generalized (osteo)arthritis: Secondary | ICD-10-CM | POA: Diagnosis not present

## 2023-07-27 DIAGNOSIS — N184 Chronic kidney disease, stage 4 (severe): Secondary | ICD-10-CM | POA: Diagnosis not present

## 2023-07-27 DIAGNOSIS — E1122 Type 2 diabetes mellitus with diabetic chronic kidney disease: Secondary | ICD-10-CM | POA: Diagnosis not present

## 2023-07-27 DIAGNOSIS — Z23 Encounter for immunization: Secondary | ICD-10-CM | POA: Diagnosis not present

## 2023-07-27 DIAGNOSIS — D631 Anemia in chronic kidney disease: Secondary | ICD-10-CM | POA: Diagnosis not present

## 2023-07-27 DIAGNOSIS — I129 Hypertensive chronic kidney disease with stage 1 through stage 4 chronic kidney disease, or unspecified chronic kidney disease: Secondary | ICD-10-CM | POA: Diagnosis not present

## 2023-08-01 NOTE — Progress Notes (Signed)
Subjective:  Patient ID: Stephanie Middleton, female    DOB: 05/28/1931,  MRN: [id number]  89 y.o. female presents at risk foot care. Pt has h/o NIDDM with chronic kidney disease and painful elongated mycotic toenails 1-5 bilaterally which are tender when wearing enclosed shoe gear. Pain is relieved with periodic professional debridement.  New problem(s): None   PCP is Stephanie Mires, MD , and she has scheduled visit on July 27, 2023.  No Known Allergies  Review of Systems: Negative except as noted in the HPI.   Objective:  Stephanie Middleton is a pleasant 89 y.o. female WD, WN in NAD. AAO x 3.  Vascular Examination: Vascular status intact b/l with palpable pedal pulses. CFT immediate b/l. Pedal hair present. Edema b/l LE. No pain with calf compression b/l. Skin temperature gradient WNL b/l. No varicosities noted. No cyanosis or clubbing noted.  Neurological Examination: Sensation grossly intact b/l with 10 gram monofilament. Vibratory sensation intact b/l.  Dermatological Examination: Pedal skin with normal turgor, texture and tone b/l. No open wounds nor interdigital macerations noted.   Toenails 2-5 b/l thick, discolored, elongated with subungual debris and pain on dorsal palpation. No hyperkeratotic lesions noted b/l.   Incurvated nailplate both borders of left hallux and both borders of right hallux with tenderness to palpation. No erythema, no edema, no drainage noted. No fluctuance.   Musculoskeletal Examination: Muscle strength 5/5 to b/l LE.  No pain, crepitus noted b/l. HAV with bunion bilaterally and hammertoes 2-5 b/l.  Radiographs: None  Assessment:   1. Pain due to onychomycosis of toenails of both feet   2. Type 2 diabetes mellitus with diabetic nephropathy, with long-term current use of insulin (HCC)    Plan:  -Patient's family member present. All questions/concerns addressed on today's visit. -Discussed edema and compression garments. Recommend 15-20 mmHg. Also  discussed appropriate shoe gear. Avoid leather dress shoes. -Patient to continue soft, supportive shoe gear daily. -Toenails 1-5 b/l were debrided in length and girth with sterile nail nippers and dremel without iatrogenic bleeding.  -Patient/POA to call should there be question/concern in the interim.  Return in about 9 weeks (around 09/27/2023).  Stephanie Middleton, DPM

## 2023-08-02 DIAGNOSIS — D631 Anemia in chronic kidney disease: Secondary | ICD-10-CM | POA: Diagnosis not present

## 2023-08-02 DIAGNOSIS — N2581 Secondary hyperparathyroidism of renal origin: Secondary | ICD-10-CM | POA: Diagnosis not present

## 2023-08-02 DIAGNOSIS — E1129 Type 2 diabetes mellitus with other diabetic kidney complication: Secondary | ICD-10-CM | POA: Diagnosis not present

## 2023-08-02 DIAGNOSIS — N185 Chronic kidney disease, stage 5: Secondary | ICD-10-CM | POA: Diagnosis not present

## 2023-08-05 ENCOUNTER — Encounter: Payer: Medicare PPO | Admitting: *Deleted

## 2023-08-05 VITALS — BP 150/59 | HR 59 | Temp 98.0°F | Resp 16

## 2023-08-05 DIAGNOSIS — D631 Anemia in chronic kidney disease: Secondary | ICD-10-CM | POA: Diagnosis not present

## 2023-08-05 DIAGNOSIS — N189 Chronic kidney disease, unspecified: Secondary | ICD-10-CM | POA: Diagnosis not present

## 2023-08-05 LAB — GLUCOSE HEMOCUE WAIVED: Hemoglobin: 8.8

## 2023-08-05 MED ORDER — EPOETIN ALFA-EPBX 10000 UNIT/ML IJ SOLN
8000.0000 [IU] | Freq: Once | INTRAMUSCULAR | Status: AC
Start: 2023-08-05 — End: 2023-08-05
  Administered 2023-08-05: 8000 [IU] via SUBCUTANEOUS

## 2023-08-05 NOTE — Progress Notes (Signed)
Diagnosis: Anemia in Chronic Kidney Disease  Provider:  Celso Amy MD  Procedure: Injection  Retacrit (epoetin alfa-epbx), Dose: 8000 Units, Site: subcutaneous, Number of injections: 1  Hgb. 8.8  Post Care: Observation period completed  Discharge: Condition: Good, Destination: Home . AVS Provided  Performed by:  Daleen Squibb, RN

## 2023-08-19 ENCOUNTER — Encounter: Payer: Medicare PPO | Attending: Nephrology | Admitting: *Deleted

## 2023-08-19 VITALS — BP 147/71 | HR 63 | Temp 98.6°F | Resp 16

## 2023-08-19 DIAGNOSIS — N189 Chronic kidney disease, unspecified: Secondary | ICD-10-CM

## 2023-08-19 DIAGNOSIS — D631 Anemia in chronic kidney disease: Secondary | ICD-10-CM

## 2023-08-19 LAB — GLUCOSE HEMOCUE WAIVED: Hemoglobin: 8.7

## 2023-08-19 MED ORDER — CLONIDINE HCL 0.1 MG PO TABS: 0.1000 mg | ORAL_TABLET | ORAL | Status: DC | PRN

## 2023-08-19 MED ORDER — EPOETIN ALFA-EPBX 10000 UNIT/ML IJ SOLN
8000.0000 [IU] | Freq: Once | INTRAMUSCULAR | Status: AC
  Administered 2023-08-19: 8000 [IU] via SUBCUTANEOUS

## 2023-08-19 NOTE — Progress Notes (Signed)
Diagnosis: Anemia in Chronic Kidney Disease  Provider:  Celso Amy MD  Procedure: Injection  Retacrit SQ Injection, Dose: 10000 Units, Site: subcutaneous, Number of injections: 1  Hgb. 8.7   Post Care: Observation period completed  Discharge: Condition: Good, Destination: Home . AVS Provided  Performed by:  Daleen Squibb, RN

## 2023-09-01 ENCOUNTER — Encounter: Payer: Medicare PPO | Admitting: *Deleted

## 2023-09-01 VITALS — BP 169/57 | HR 64 | Temp 98.0°F | Resp 16

## 2023-09-01 DIAGNOSIS — D631 Anemia in chronic kidney disease: Secondary | ICD-10-CM | POA: Diagnosis not present

## 2023-09-01 DIAGNOSIS — N184 Chronic kidney disease, stage 4 (severe): Secondary | ICD-10-CM

## 2023-09-01 LAB — GLUCOSE HEMOCUE WAIVED: Hemoglobin: 8.9

## 2023-09-01 MED ORDER — EPOETIN ALFA-EPBX 10000 UNIT/ML IJ SOLN
8000.0000 [IU] | Freq: Once | INTRAMUSCULAR | Status: AC
Start: 2023-09-01 — End: 2023-09-01
  Administered 2023-09-01: 8000 [IU] via SUBCUTANEOUS

## 2023-09-01 MED ORDER — CLONIDINE HCL 0.1 MG PO TABS: 0.1000 mg | ORAL_TABLET | ORAL | Status: DC | PRN

## 2023-09-01 NOTE — Progress Notes (Signed)
Diagnosis: Anemia in Chronic Kidney Disease  Provider:  Celso Amy MD  Procedure: Injection  Retacrit (epoetin alfa-epbx), Dose: 8000 Units, Site: subcutaneous, Number of injections: 1  Hgb. 8.9  Post Care: Observation period completed  Discharge: Condition: Good, Destination: Home . AVS Provided  Performed by:  Daleen Squibb, RN

## 2023-09-16 ENCOUNTER — Encounter: Payer: Medicare PPO | Attending: Nephrology | Admitting: *Deleted

## 2023-09-16 VITALS — BP 182/71 | HR 67 | Temp 98.4°F

## 2023-09-16 DIAGNOSIS — N184 Chronic kidney disease, stage 4 (severe): Secondary | ICD-10-CM | POA: Diagnosis not present

## 2023-09-16 DIAGNOSIS — N189 Chronic kidney disease, unspecified: Secondary | ICD-10-CM | POA: Insufficient documentation

## 2023-09-16 DIAGNOSIS — D631 Anemia in chronic kidney disease: Secondary | ICD-10-CM | POA: Insufficient documentation

## 2023-09-16 LAB — GLUCOSE HEMOCUE WAIVED: Hemoglobin: 8.8

## 2023-09-16 MED ORDER — CLONIDINE HCL 0.1 MG PO TABS: 0.1000 mg | ORAL_TABLET | ORAL | Status: DC | PRN

## 2023-09-16 MED ORDER — EPOETIN ALFA-EPBX 10000 UNIT/ML IJ SOLN
8000.0000 [IU] | Freq: Once | INTRAMUSCULAR | Status: AC
  Administered 2023-09-16: 8000 [IU] via SUBCUTANEOUS

## 2023-09-16 NOTE — Progress Notes (Signed)
Diagnosis: Anemia in Chronic Kidney Disease  Provider:  Celso Amy MD  Procedure: Injection  Retacrit (epoetin alfa-epbx), Dose: 8000 Units, Site: subcutaneous, Number of injections: 1  Injection Site(s): Left upper quad. abdomen  Post Care: Observation period completed  Discharge: Condition: Good, Destination: Home . AVS Provided  Performed by:  Daleen Squibb, RN

## 2023-09-20 DIAGNOSIS — E1122 Type 2 diabetes mellitus with diabetic chronic kidney disease: Secondary | ICD-10-CM | POA: Diagnosis not present

## 2023-09-22 ENCOUNTER — Inpatient Hospital Stay: Payer: Medicare PPO

## 2023-09-22 ENCOUNTER — Inpatient Hospital Stay: Payer: Medicare PPO | Attending: Hematology | Admitting: Hematology

## 2023-09-22 ENCOUNTER — Encounter: Payer: Self-pay | Admitting: Hematology

## 2023-09-22 VITALS — BP 143/60 | HR 64 | Temp 98.4°F | Resp 18 | Ht 62.0 in | Wt 173.2 lb

## 2023-09-22 DIAGNOSIS — E1122 Type 2 diabetes mellitus with diabetic chronic kidney disease: Secondary | ICD-10-CM | POA: Insufficient documentation

## 2023-09-22 DIAGNOSIS — D631 Anemia in chronic kidney disease: Secondary | ICD-10-CM | POA: Insufficient documentation

## 2023-09-22 DIAGNOSIS — Z794 Long term (current) use of insulin: Secondary | ICD-10-CM | POA: Insufficient documentation

## 2023-09-22 DIAGNOSIS — G8929 Other chronic pain: Secondary | ICD-10-CM | POA: Insufficient documentation

## 2023-09-22 DIAGNOSIS — I120 Hypertensive chronic kidney disease with stage 5 chronic kidney disease or end stage renal disease: Secondary | ICD-10-CM | POA: Insufficient documentation

## 2023-09-22 DIAGNOSIS — Z7984 Long term (current) use of oral hypoglycemic drugs: Secondary | ICD-10-CM | POA: Diagnosis not present

## 2023-09-22 DIAGNOSIS — Z79899 Other long term (current) drug therapy: Secondary | ICD-10-CM | POA: Insufficient documentation

## 2023-09-22 DIAGNOSIS — Z806 Family history of leukemia: Secondary | ICD-10-CM | POA: Insufficient documentation

## 2023-09-22 DIAGNOSIS — N185 Chronic kidney disease, stage 5: Secondary | ICD-10-CM | POA: Insufficient documentation

## 2023-09-22 DIAGNOSIS — E7800 Pure hypercholesterolemia, unspecified: Secondary | ICD-10-CM | POA: Diagnosis not present

## 2023-09-22 DIAGNOSIS — Z7982 Long term (current) use of aspirin: Secondary | ICD-10-CM | POA: Diagnosis not present

## 2023-09-22 DIAGNOSIS — N189 Chronic kidney disease, unspecified: Secondary | ICD-10-CM

## 2023-09-22 LAB — RETICULOCYTES
Immature Retic Fract: 20.7 % — ABNORMAL HIGH (ref 2.3–15.9)
RBC.: 2.9 MIL/uL — ABNORMAL LOW (ref 3.87–5.11)
Retic Count, Absolute: 46.7 10*3/uL (ref 19.0–186.0)
Retic Ct Pct: 1.6 % (ref 0.4–3.1)

## 2023-09-22 LAB — CBC WITH DIFFERENTIAL/PLATELET
Abs Immature Granulocytes: 0.03 10*3/uL (ref 0.00–0.07)
Basophils Absolute: 0 10*3/uL (ref 0.0–0.1)
Basophils Relative: 1 %
Eosinophils Absolute: 0.1 10*3/uL (ref 0.0–0.5)
Eosinophils Relative: 3 %
HCT: 28.9 % — ABNORMAL LOW (ref 36.0–46.0)
Hemoglobin: 8.8 g/dL — ABNORMAL LOW (ref 12.0–15.0)
Immature Granulocytes: 1 %
Lymphocytes Relative: 25 %
Lymphs Abs: 1.2 10*3/uL (ref 0.7–4.0)
MCH: 30.7 pg (ref 26.0–34.0)
MCHC: 30.4 g/dL (ref 30.0–36.0)
MCV: 100.7 fL — ABNORMAL HIGH (ref 80.0–100.0)
Monocytes Absolute: 0.4 10*3/uL (ref 0.1–1.0)
Monocytes Relative: 9 %
Neutro Abs: 2.9 10*3/uL (ref 1.7–7.7)
Neutrophils Relative %: 61 %
Platelets: 223 10*3/uL (ref 150–400)
RBC: 2.87 MIL/uL — ABNORMAL LOW (ref 3.87–5.11)
RDW: 15.6 % — ABNORMAL HIGH (ref 11.5–15.5)
WBC: 4.8 10*3/uL (ref 4.0–10.5)
nRBC: 0 % (ref 0.0–0.2)

## 2023-09-22 LAB — FERRITIN: Ferritin: 598 ng/mL — ABNORMAL HIGH (ref 11–307)

## 2023-09-22 LAB — FOLATE: Folate: 21.9 ng/mL (ref >5.9)

## 2023-09-22 LAB — LACTATE DEHYDROGENASE: LDH: 134 U/L (ref 98–192)

## 2023-09-22 LAB — IRON AND TIBC
Iron: 43 ug/dL (ref 28–170)
Saturation Ratios: 15 % (ref 10.4–31.8)
TIBC: 287 ug/dL (ref 250–450)
UIBC: 244 ug/dL

## 2023-09-22 LAB — VITAMIN B12: Vitamin B-12: 237 pg/mL (ref 180–914)

## 2023-09-22 NOTE — Progress Notes (Signed)
[facility] [address] [place] [address]   Clinic Day:  09/22/2023  Referring physician: Randa Lynn, MD  Patient Care Team: [name], MD as PCP - General (Family Medicine)   ASSESSMENT & PLAN:   Assessment:  1.  Normocytic anemia from CKD: - Patient seen at the request of Dr. Wolfgang Phoenix - She is receiving Retacrit 8000 units every 2 weeks since 06/05/2020, prior to that 6000 units every 2 weeks from 12/06/2019, prior to that 4000 units every 2 weeks since October 2017. - She has CKD stage V, CKD since 2009 secondary to diabetes.  She has nonnephrotic range proteinuria. - She has been on iron tablet daily for many years.  Never had colonoscopy.  Denies any bleeding per rectum or melena.  2.  Social/family history: - Her daughter Misty Stanley lives with the patient.  Patient is independent of ADLs.  She was never smoker. - No family history of anemia.  Her mother had leukemia.  Plan:  1.  Normocytic anemia from CKD: - We will check her CBC today along with ferritin and iron panel.  Will also check for other nutritional deficiencies, hemolysis, bone marrow infiltrative process. - She will get her next Retacrit injection 8000 units on 09/30/2023 at outpatient center. - She will return to our clinic in 3 weeks.  From that point onwards, she will start Retacrit at our clinic at a dose of 10,000 units every 2 weeks.  We will also replenish if there is any nutritional deficiencies.   Orders Placed This Encounter  Procedures   CBC with Differential    Standing Status:   Future    Number of Occurrences:   1    Expected Date:   09/22/2023    Expiration Date:   09/21/2024   Iron and TIBC ([facility] ONLY)    Standing Status:   Future    Number of Occurrences:   1    Expected Date:   09/22/2023    Expiration Date:   09/21/2024   Ferritin    Standing Status:   Future    Number of Occurrences:   1    Expected Date:   09/22/2023    Expiration  Date:   09/21/2024   Vitamin B12    Standing Status:   Future    Number of Occurrences:   1    Expected Date:   09/22/2023    Expiration Date:   09/21/2024   Folate    Standing Status:   Future    Number of Occurrences:   1    Expected Date:   09/22/2023    Expiration Date:   09/21/2024   Methylmalonic acid, serum    Standing Status:   Future    Number of Occurrences:   1    Expected Date:   09/22/2023    Expiration Date:   09/21/2024   Copper, serum    Standing Status:   Future    Number of Occurrences:   1    Expected Date:   09/22/2023    Expiration Date:   09/21/2024   Kappa/lambda light chains    Standing Status:   Future    Number of Occurrences:   1    Expected Date:   09/22/2023    Expiration Date:   09/21/2024   Immunofixation electrophoresis    Standing Status:   Future    Number of Occurrences:   1    Expected Date:  09/22/2023    Expiration Date:   09/21/2024   Protein electrophoresis, serum    Standing Status:   Future    Number of Occurrences:   1    Expected Date:   09/22/2023    Expiration Date:   09/21/2024   Reticulocytes    Standing Status:   Future    Number of Occurrences:   1    Expected Date:   09/22/2023    Expiration Date:   09/21/2024   Lactate dehydrogenase    Standing Status:   Future    Number of Occurrences:   1    Expected Date:   09/22/2023    Expiration Date:   09/21/2024      Mikeal Hawthorne R Teague,acting as a scribe for Doreatha Massed, MD.,have documented all relevant documentation on the behalf of Doreatha Massed, MD,as directed by  Doreatha Massed, MD while in the presence of Doreatha Massed, MD.   I, Doreatha Massed MD, have reviewed the above documentation for accuracy and completeness, and I agree with the above.   Doreatha Massed, MD   12/18/20242:24 PM  CHIEF COMPLAINT/PURPOSE OF CONSULT:   Diagnosis: Anemia from CKD.  Current Therapy: Retacrit every 2 weeks  HISTORY OF PRESENT ILLNESS:    Stephanie Middleton is a 89 y.o. female presenting to clinic today for evaluation of anemia in CKD at the request of Bhutani, Alger Memos, MD.  Patient has a history of HTN, CKD stage V, Type 2 DM, proteinuria, secondary hyperparathyroidism of renal origin, and anemia in CKD. She was previously seen by Dr. Wolfgang Phoenix for management of anemia and received Epogen injections at [facility]. Her most recent CBC from 07/22/23 found low RBC at 2.94, low HGB at 8.9, low HCT at 28.5, and low MCHC at 31.2.   Today, she states that she is doing well overall. Her appetite level is at 100%. Her energy level is at 100%.    PAST MEDICAL HISTORY:   Past Medical History: Past Medical History:  Diagnosis Date   Anemia    Chronic kidney disease    Chronic pain    Back, right hip and right knee   Diabetes mellitus without complication (HCC)    Diplopia    Hypercholesteremia    Hypertension     Surgical History: Past Surgical History:  Procedure Laterality Date   CHOLECYSTECTOMY     CYST EXCISION      Social History: Social History   Socioeconomic History   Marital status: Widowed    Spouse name: Not on file   Number of children: Not on file   Years of education: Not on file   Highest education level: Not on file  Occupational History   Not on file  Tobacco Use   Smoking status: Never   Smokeless tobacco: Never  Vaping Use   Vaping status: Never Used  Substance and Sexual Activity   Alcohol use: Never   Drug use: Never   Sexual activity: Not Currently    Birth control/protection: None  Other Topics Concern   Not on file  Social History Narrative   Lives with daughter   Social Drivers of Health   Financial Resource Strain: Not on file  Food Insecurity: No Food Insecurity (09/22/2023)   Hunger Vital Sign    Worried About Running Out of Food in the Last Year: Never true    Ran Out of Food in the Last Year: Never true  Transportation Needs: No Transportation Needs (09/22/2023)   PRAPARE -  Administrator, Civil Service (Medical): No    Lack of Transportation (Non-Medical): No  Physical Activity: Not on file  Stress: Not on file  Social Connections: Not on file  Intimate Partner Violence: Not At Risk (09/22/2023)   Humiliation, Afraid, Rape, and Kick questionnaire    Fear of Current or Ex-Partner: No    Emotionally Abused: No    Physically Abused: No    Sexually Abused: No    Family History: Family History  Problem Relation Age of Onset   Leukemia Mother    Heart disease Father     Current Medications:  Current Outpatient Medications:    ACCU-CHEK GUIDE test strip, , Disp: , Rfl:    Accu-Chek Softclix Lancets lancets, , Disp: , Rfl:    acetaminophen (TYLENOL) 500 MG tablet, Take 500 mg by mouth 2 (two) times a day., Disp: , Rfl:    allopurinol (ZYLOPRIM) 100 MG tablet, Take 100 mg by mouth daily., Disp: , Rfl:    amLODipine (NORVASC) 5 MG tablet, Take 5 mg by mouth daily., Disp: , Rfl:    aspirin EC 81 MG tablet, Take 81 mg by mouth daily., Disp: , Rfl:    B-D ULTRAFINE III SHORT PEN 31G X 8 MM MISC, Inject into the skin as directed., Disp: , Rfl:    calcitRIOL (ROCALTROL) 0.25 MCG capsule, Take 0.25 mcg by mouth daily., Disp: , Rfl:    Cholecalciferol 125 MCG (5000 UT) capsule, Take 5,000 Units by mouth daily., Disp: , Rfl:    epoetin alfa (EPOGEN) 4000 UNIT/ML injection, Inject 8,000 Units into the skin every 14 (fourteen) days. , Disp: , Rfl:    furosemide (LASIX) 20 MG tablet, Take 20 mg by mouth., Disp: , Rfl:    glipiZIDE (GLUCOTROL) 5 MG tablet, Take by mouth 2 (two) times daily before a meal. Take 2 tablets twice daily, Disp: , Rfl:    iron polysaccharides (NIFEREX) 150 MG capsule, Take 150 mg by mouth 2 (two) times daily., Disp: , Rfl:    labetalol (NORMODYNE) 200 MG tablet, Take 200 mg by mouth 2 (two) times daily. Take 2 tablets twice daily, Disp: , Rfl:    linagliptin (TRADJENTA) 5 MG TABS tablet, Take 5 mg by mouth daily., Disp: , Rfl:     pravastatin (PRAVACHOL) 40 MG tablet, Take 40 mg by mouth at bedtime., Disp: , Rfl:    RABEprazole (ACIPHEX) 20 MG tablet, Take 20 mg by mouth daily as needed. 1 tablet by mouth everyday as needed for indigestion, Disp: , Rfl:    TRESIBA FLEXTOUCH 100 UNIT/ML FlexTouch Pen, SMARTSIG:26 Unit(s) SUB-Q Daily, Disp: , Rfl:    Allergies: No Known Allergies  REVIEW OF SYSTEMS:   Review of Systems  Constitutional:  Negative for chills, fatigue and fever.  HENT:   Negative for lump/mass, mouth sores, nosebleeds, sore throat and trouble swallowing.   Eyes:  Negative for eye problems.  Respiratory:  Negative for cough and shortness of breath.   Cardiovascular:  Negative for chest pain, leg swelling and palpitations.  Gastrointestinal:  Positive for constipation. Negative for abdominal pain, diarrhea, nausea and vomiting.  Genitourinary:  Negative for bladder incontinence, difficulty urinating, dysuria, frequency, hematuria and nocturia.   Musculoskeletal:  Positive for arthralgias. Negative for back pain, flank pain, myalgias and neck pain.  Skin:  Negative for itching and rash.  Neurological:  Negative for dizziness, headaches and numbness.  Hematological:  Does not bruise/bleed easily.  Psychiatric/Behavioral:  Negative for depression, sleep disturbance  and suicidal ideas. The patient is not nervous/anxious.   All other systems reviewed and are negative.    VITALS:   Blood pressure (!) 143/60, pulse 64, temperature 98.4 F (36.9 C), temperature source Tympanic, resp. rate 18, height 5\' 2"  (1.575 m), weight 173 lb 3.2 oz (78.6 kg), SpO2 100%.  Wt Readings from Last 3 Encounters:  09/22/23 173 lb 3.2 oz (78.6 kg)  08/20/22 172 lb 2.9 oz (78.1 kg)  07/09/22 172 lb 2.9 oz (78.1 kg)    Body mass index is 31.68 kg/m.   PHYSICAL EXAM:   Physical Exam Vitals and nursing note reviewed. Exam conducted with a chaperone present.  Constitutional:      Appearance: Normal appearance.   Cardiovascular:     Rate and Rhythm: Normal rate and regular rhythm.     Pulses: Normal pulses.     Heart sounds: Normal heart sounds.  Pulmonary:     Effort: Pulmonary effort is normal.     Breath sounds: Normal breath sounds.  Abdominal:     Palpations: Abdomen is soft. There is no hepatomegaly, splenomegaly or mass.     Tenderness: There is no abdominal tenderness.  Musculoskeletal:     Right lower leg: No edema.     Left lower leg: No edema.  Lymphadenopathy:     Cervical: No cervical adenopathy.     Right cervical: No superficial, deep or posterior cervical adenopathy.    Left cervical: No superficial, deep or posterior cervical adenopathy.     Upper Body:     Right upper body: No supraclavicular or axillary adenopathy.     Left upper body: No supraclavicular or axillary adenopathy.  Neurological:     General: No focal deficit present.     Mental Status: She is alert and oriented to person, place, and time.  Psychiatric:        Mood and Affect: Mood normal.        Behavior: Behavior normal.     LABS:      Latest Ref Rng & Units 07/22/2023   10:17 AM 04/29/2023   10:12 AM 04/29/2023   10:03 AM  CBC  WBC 3.8 - 10.8 Thousand/uL 5.0   5.0   Hemoglobin 11.7 - 15.5 g/dL 8.9  9.7  9.2   Hematocrit 35.0 - 45.0 % 28.5   29.8   Platelets 140 - 400 Thousand/uL 216   205       Latest Ref Rng & Units 07/22/2023   10:17 AM 04/29/2023   10:03 AM 04/01/2023   10:11 AM  CMP  Glucose 65 - 99 mg/dL 161  096  045   BUN 7 - 25 mg/dL 66  71  76   Creatinine 0.60 - 0.95 mg/dL 4.09  8.11  9.14   Sodium 135 - 146 mmol/L 143  137  139   Potassium 3.5 - 5.3 mmol/L 4.5  4.5  4.1   Chloride 98 - 110 mmol/L 105  103  101   CO2 20 - 32 mmol/L 23  23  24    Calcium 8.6 - 10.4 mg/dL 9.5  9.4  9.5      No results found for: "CEA1", "CEA" / No results found for: "CEA1", "CEA" No results found for: "PSA1" No results found for: "NWG956" No results found for: "CAN125"  No results found  for: "TOTALPROTELP", "ALBUMINELP", "A1GS", "A2GS", "BETS", "BETA2SER", "GAMS", "MSPIKE", "SPEI" Lab Results  Component Value Date   TIBC 287 01/07/2023   TIBC 331 08/06/2022  TIBC 285 10/15/2021   FERRITIN 659 (H) 08/06/2022   FERRITIN 673 (H) 10/15/2021   FERRITIN 657 (H) 01/31/2020   IRONPCTSAT 23 01/07/2023   IRONPCTSAT 20 08/06/2022   IRONPCTSAT 26 10/15/2021   No results found for: "LDH"   STUDIES:   No results found.

## 2023-09-22 NOTE — Patient Instructions (Signed)
You were seen and examined today by Dr. Ellin Saba. Dr. Ellin Saba is a hematologist, meaning that he specializes in blood abnormalities. Dr. Ellin Saba discussed your past medical history, family history of cancers/blood conditions and the events that led to you being here today.  You were referred to Dr. Ellin Saba due to anemia (low hemoglobin).  Dr. Ellin Saba has recommended additional labs today for further evaluation.  Follow-up as scheduled.

## 2023-09-23 LAB — KAPPA/LAMBDA LIGHT CHAINS
Kappa free light chain: 51.9 mg/L — ABNORMAL HIGH (ref 3.3–19.4)
Kappa, lambda light chain ratio: 0.59 (ref 0.26–1.65)
Lambda free light chains: 87.7 mg/L — ABNORMAL HIGH (ref 5.7–26.3)

## 2023-09-23 LAB — COPPER, SERUM: Copper: 92 ug/dL (ref 80–158)

## 2023-09-24 LAB — METHYLMALONIC ACID, SERUM: Methylmalonic Acid, Quantitative: 720 nmol/L — ABNORMAL HIGH (ref 0–378)

## 2023-09-30 ENCOUNTER — Other Ambulatory Visit: Payer: Self-pay | Admitting: Emergency Medicine

## 2023-09-30 VITALS — BP 159/53 | HR 61 | Temp 98.4°F | Resp 16

## 2023-09-30 DIAGNOSIS — E1122 Type 2 diabetes mellitus with diabetic chronic kidney disease: Secondary | ICD-10-CM | POA: Diagnosis not present

## 2023-09-30 DIAGNOSIS — N189 Chronic kidney disease, unspecified: Secondary | ICD-10-CM | POA: Diagnosis not present

## 2023-09-30 DIAGNOSIS — D631 Anemia in chronic kidney disease: Secondary | ICD-10-CM | POA: Diagnosis not present

## 2023-09-30 DIAGNOSIS — N184 Chronic kidney disease, stage 4 (severe): Secondary | ICD-10-CM | POA: Diagnosis not present

## 2023-09-30 LAB — GLUCOSE HEMOCUE WAIVED: Hemoglobin: 8.8

## 2023-09-30 MED ORDER — EPOETIN ALFA-EPBX 4000 UNIT/ML IJ SOLN
8000.0000 [IU] | Freq: Once | INTRAMUSCULAR | Status: AC
  Administered 2023-09-30: 8000 [IU] via SUBCUTANEOUS

## 2023-09-30 MED ORDER — CLONIDINE HCL 0.1 MG PO TABS
0.1000 mg | ORAL_TABLET | ORAL | Status: DC | PRN
Start: 2023-09-30 — End: 2023-09-30

## 2023-09-30 NOTE — Progress Notes (Signed)
Diagnosis: Iron Deficiency Anemia  Provider:  Celso Amy MD  Procedure: Injection  Retacrit (epoetin alfa-epbx), Dose: 8000 Units, Site: subcutaneous, Number of injections: 1  Injection Site(s): Right upper quad. abdomen  Post Care: Observation period completed  Discharge: Condition: Good, Destination: Home . AVS Provided  Performed by:  Cleotilde Neer, LPN

## 2023-10-01 LAB — RENAL FUNCTION PANEL
Albumin: 4.4 g/dL (ref 3.6–5.1)
BUN/Creatinine Ratio: 21 (calc) (ref 6–22)
BUN: 68 mg/dL — ABNORMAL HIGH (ref 7–25)
CO2: 25 mmol/L (ref 20–32)
Calcium: 9.7 mg/dL (ref 8.6–10.4)
Chloride: 107 mmol/L (ref 98–110)
Creat: 3.19 mg/dL — ABNORMAL HIGH (ref 0.60–0.95)
Glucose, Bld: 102 mg/dL — ABNORMAL HIGH (ref 65–99)
Phosphorus: 4.3 mg/dL (ref 2.1–4.3)
Potassium: 4.3 mmol/L (ref 3.5–5.3)
Sodium: 143 mmol/L (ref 135–146)

## 2023-10-01 LAB — CBC WITH DIFFERENTIAL/PLATELET
Absolute Lymphocytes: 945 {cells}/uL (ref 850–3900)
Absolute Monocytes: 340 {cells}/uL (ref 200–950)
Basophils Absolute: 29 {cells}/uL (ref 0–200)
Basophils Relative: 0.7 %
Eosinophils Absolute: 139 {cells}/uL (ref 15–500)
Eosinophils Relative: 3.3 %
HCT: 26.4 % — ABNORMAL LOW (ref 35.0–45.0)
Hemoglobin: 8.5 g/dL — ABNORMAL LOW (ref 11.7–15.5)
MCH: 30.7 pg (ref 27.0–33.0)
MCHC: 32.2 g/dL (ref 32.0–36.0)
MCV: 95.3 fL (ref 80.0–100.0)
MPV: 10.4 fL (ref 7.5–12.5)
Monocytes Relative: 8.1 %
Neutro Abs: 2747 {cells}/uL (ref 1500–7800)
Neutrophils Relative %: 65.4 %
Platelets: 210 10*3/uL (ref 140–400)
RBC: 2.77 10*6/uL — ABNORMAL LOW (ref 3.80–5.10)
RDW: 14.3 % (ref 11.0–15.0)
Total Lymphocyte: 22.5 %
WBC: 4.2 10*3/uL (ref 3.8–10.8)

## 2023-10-01 LAB — HEMOGLOBIN A1C
Hgb A1c MFr Bld: 5.9 %{Hb} — ABNORMAL HIGH (ref <5.7)
Mean Plasma Glucose: 123 mg/dL
eAG (mmol/L): 6.8 mmol/L

## 2023-10-03 LAB — PROTEIN ELECTROPHORESIS, SERUM
A/G Ratio: 1.3 (ref 0.7–1.7)
Albumin ELP: 4.1 g/dL (ref 2.9–4.4)
Alpha-1-Globulin: 0.3 g/dL (ref 0.0–0.4)
Alpha-2-Globulin: 0.9 g/dL (ref 0.4–1.0)
Beta Globulin: 1.4 g/dL — ABNORMAL HIGH (ref 0.7–1.3)
Gamma Globulin: 0.5 g/dL (ref 0.4–1.8)
Globulin, Total: 3.1 g/dL (ref 2.2–3.9)
Total Protein ELP: 7.2 g/dL (ref 6.0–8.5)

## 2023-10-04 LAB — IMMUNOFIXATION ELECTROPHORESIS
IgA: 726 mg/dL — ABNORMAL HIGH (ref 64–422)
IgG (Immunoglobin G), Serum: 542 mg/dL — ABNORMAL LOW (ref 586–1602)
IgM (Immunoglobulin M), Srm: 24 mg/dL — ABNORMAL LOW (ref 26–217)
Total Protein ELP: 7.2 g/dL (ref 6.0–8.5)

## 2023-10-05 ENCOUNTER — Other Ambulatory Visit: Payer: Medicare PPO

## 2023-10-05 ENCOUNTER — Ambulatory Visit: Payer: Medicare PPO

## 2023-10-11 ENCOUNTER — Ambulatory Visit: Payer: Medicare PPO | Admitting: Podiatry

## 2023-10-11 ENCOUNTER — Encounter: Payer: Self-pay | Admitting: Podiatry

## 2023-10-11 DIAGNOSIS — Z794 Long term (current) use of insulin: Secondary | ICD-10-CM

## 2023-10-11 DIAGNOSIS — E1121 Type 2 diabetes mellitus with diabetic nephropathy: Secondary | ICD-10-CM | POA: Diagnosis not present

## 2023-10-11 DIAGNOSIS — M79675 Pain in left toe(s): Secondary | ICD-10-CM

## 2023-10-11 DIAGNOSIS — B351 Tinea unguium: Secondary | ICD-10-CM

## 2023-10-11 DIAGNOSIS — M79674 Pain in right toe(s): Secondary | ICD-10-CM | POA: Diagnosis not present

## 2023-10-13 ENCOUNTER — Inpatient Hospital Stay: Payer: Medicare PPO

## 2023-10-13 ENCOUNTER — Inpatient Hospital Stay: Payer: Medicare PPO | Attending: Oncology | Admitting: Oncology

## 2023-10-13 ENCOUNTER — Inpatient Hospital Stay: Payer: Medicare PPO | Admitting: Hematology

## 2023-10-13 ENCOUNTER — Other Ambulatory Visit: Payer: Self-pay

## 2023-10-13 VITALS — BP 172/69 | HR 71 | Temp 98.0°F | Resp 16 | Wt 171.7 lb

## 2023-10-13 DIAGNOSIS — Z7982 Long term (current) use of aspirin: Secondary | ICD-10-CM | POA: Insufficient documentation

## 2023-10-13 DIAGNOSIS — K5909 Other constipation: Secondary | ICD-10-CM | POA: Diagnosis not present

## 2023-10-13 DIAGNOSIS — N185 Chronic kidney disease, stage 5: Secondary | ICD-10-CM | POA: Insufficient documentation

## 2023-10-13 DIAGNOSIS — D631 Anemia in chronic kidney disease: Secondary | ICD-10-CM

## 2023-10-13 DIAGNOSIS — Z794 Long term (current) use of insulin: Secondary | ICD-10-CM | POA: Diagnosis not present

## 2023-10-13 DIAGNOSIS — R350 Frequency of micturition: Secondary | ICD-10-CM | POA: Diagnosis not present

## 2023-10-13 DIAGNOSIS — R768 Other specified abnormal immunological findings in serum: Secondary | ICD-10-CM | POA: Diagnosis not present

## 2023-10-13 DIAGNOSIS — D509 Iron deficiency anemia, unspecified: Secondary | ICD-10-CM | POA: Insufficient documentation

## 2023-10-13 DIAGNOSIS — E7800 Pure hypercholesterolemia, unspecified: Secondary | ICD-10-CM | POA: Insufficient documentation

## 2023-10-13 DIAGNOSIS — M25551 Pain in right hip: Secondary | ICD-10-CM | POA: Insufficient documentation

## 2023-10-13 DIAGNOSIS — I120 Hypertensive chronic kidney disease with stage 5 chronic kidney disease or end stage renal disease: Secondary | ICD-10-CM | POA: Insufficient documentation

## 2023-10-13 DIAGNOSIS — Z79899 Other long term (current) drug therapy: Secondary | ICD-10-CM | POA: Diagnosis not present

## 2023-10-13 DIAGNOSIS — Z7984 Long term (current) use of oral hypoglycemic drugs: Secondary | ICD-10-CM | POA: Diagnosis not present

## 2023-10-13 DIAGNOSIS — N189 Chronic kidney disease, unspecified: Secondary | ICD-10-CM | POA: Diagnosis not present

## 2023-10-13 DIAGNOSIS — E1122 Type 2 diabetes mellitus with diabetic chronic kidney disease: Secondary | ICD-10-CM | POA: Diagnosis not present

## 2023-10-13 LAB — CBC
HCT: 29.9 % — ABNORMAL LOW (ref 36.0–46.0)
Hemoglobin: 8.9 g/dL — ABNORMAL LOW (ref 12.0–15.0)
MCH: 30.4 pg (ref 26.0–34.0)
MCHC: 29.8 g/dL — ABNORMAL LOW (ref 30.0–36.0)
MCV: 102 fL — ABNORMAL HIGH (ref 80.0–100.0)
Platelets: 208 10*3/uL (ref 150–400)
RBC: 2.93 MIL/uL — ABNORMAL LOW (ref 3.87–5.11)
RDW: 15.4 % (ref 11.5–15.5)
WBC: 4.6 10*3/uL (ref 4.0–10.5)
nRBC: 0 % (ref 0.0–0.2)

## 2023-10-13 LAB — PROTEIN / CREATININE RATIO, URINE
Creatinine, Urine: 77 mg/dL
Protein Creatinine Ratio: 0.38 mg/mg{creat} — ABNORMAL HIGH (ref 0.00–0.15)
Total Protein, Urine: 29 mg/dL

## 2023-10-13 MED ORDER — EPOETIN ALFA 10000 UNIT/ML IJ SOLN
10000.0000 [IU] | Freq: Once | INTRAMUSCULAR | Status: AC
  Administered 2023-10-13: 10000 [IU] via INTRAVENOUS
  Filled 2023-10-13: qty 1

## 2023-10-13 MED ORDER — EPOETIN ALFA-EPBX 10000 UNIT/ML IJ SOLN: 10000.0000 [IU] | Freq: Once | INTRAMUSCULAR | Status: DC

## 2023-10-13 NOTE — Progress Notes (Signed)
 Stephanie Middleton presents today for injection per the provider's orders.  Procrit  10,000 administration without incident; injection site WNL; see MAR for injection details.  Patient tolerated procedure well and without incident.  No questions or complaints noted at this time. Patient's hemoglobin noted to be 8.9 today.  Discharged from clinic via wheelchair in stable condition. Alert and oriented x 3. F/U with [facility] as scheduled.

## 2023-10-13 NOTE — Patient Instructions (Signed)
 [facility] [place] - A DEPT OF [facility]  Discharge Instructions: Thank you for choosing [facility] to provide your oncology and hematology care.  If you have a lab appointment with the Cancer Center - please note that after April 8th, 2024, all labs will be drawn in the cancer center.  You do not have to check in or register with the main entrance as you have in the past but will complete your check-in in the cancer center.  Wear comfortable clothing and clothing appropriate for easy access to any Portacath or PICC line.   We strive to give you quality time with your provider. You may need to reschedule your appointment if you arrive late (15 or more minutes).  Arriving late affects you and other patients whose appointments are after yours.  Also, if you miss three or more appointments without notifying the office, you may be dismissed from the clinic at the provider's discretion.      For prescription refill requests, have your pharmacy contact our office and allow 72 hours for refills to be completed.    Today you received Retacrit 10,000U injection.     BELOW ARE SYMPTOMS THAT SHOULD BE REPORTED IMMEDIATELY: *FEVER GREATER THAN 100.4 F (38 C) OR HIGHER *CHILLS OR SWEATING *NAUSEA AND VOMITING THAT IS NOT CONTROLLED WITH YOUR NAUSEA MEDICATION *UNUSUAL SHORTNESS OF BREATH *UNUSUAL BRUISING OR BLEEDING *URINARY PROBLEMS (pain or burning when urinating, or frequent urination) *BOWEL PROBLEMS (unusual diarrhea, constipation, pain near the anus) TENDERNESS IN MOUTH AND THROAT WITH OR WITHOUT PRESENCE OF ULCERS (sore throat, sores in mouth, or a toothache) UNUSUAL RASH, SWELLING OR PAIN  UNUSUAL VAGINAL DISCHARGE OR ITCHING   Items with * indicate a potential emergency and should be followed up as soon as possible or go to the Emergency Department if any problems should occur.  Please show the CHEMOTHERAPY ALERT CARD or IMMUNOTHERAPY ALERT CARD at  check-in to the Emergency Department and triage nurse.  Should you have questions after your visit or need to cancel or reschedule your appointment, please contact [facility] [place] - A DEPT OF [facility] [phone]  and follow the prompts.  Office hours are 8:00 a.m. to 4:30 p.m. Monday - Friday. Please note that voicemails left after 4:00 p.m. may not be returned until the following business day.  We are closed weekends and major holidays. You have access to a nurse at all times for urgent questions. Please call the main number to the clinic [phone] and follow the prompts.  For any non-urgent questions, you may also contact your provider using MyChart. We now offer e-Visits for anyone 75 and older to request care online for non-urgent symptoms. For details visit [email or web address].   Also download the MyChart app! Go to the app store, search "MyChart", open the app, select [facility], and log in with your MyChart username and password.

## 2023-10-13 NOTE — Progress Notes (Signed)
 Insurance requires Proc[employer]- orders updated.  Pryor Ochoa, PharmD

## 2023-10-13 NOTE — Progress Notes (Signed)
 [facility] [address] [place] [address]   Clinic Day:  10/20/2023  Referring physician: Leigh Lung, MD  Patient Care Team: Stephanie Lung, MD as PCP - General (Family Medicine)   ASSESSMENT & PLAN:   Assessment:  1.  Normocytic anemia from CKD: - Patient seen at the request of Dr. Rachele - She is receiving Retacrit  8000 units every 2 weeks since 06/05/2020, prior to that 6000 units every 2 weeks from 12/06/2019, prior to that 4000 units every 2 weeks since October 2017. - She has CKD stage V, CKD since 2009 secondary to diabetes.  She has nonnephrotic range proteinuria. - She has been on iron  tablet daily for many years.  Never had colonoscopy.  Denies any bleeding per rectum or melena.  2.  Social/family history: - Her daughter Stephanie Middleton lives with the patient.  Patient is independent of ADLs.  She was never smoker. - No family history of anemia.  Her mother had leukemia.  Plan:  1.  Normocytic anemia from CKD: - Lab work from 10/13/2023 shows hemoglobin of 8.9, MCV 102.0 with normal WBC and platelet counts.  Differential is normal.  Ferritin elevated at 598, folate normal at 21.9 with iron  saturations 15%.  TIBC 287.  Copper  normal and MMA significantly elevated at 720.  B12 237.  SPEP does not reveal M spike but shows elevated IgG, IgM and IgA levels.  Kappa lambda free light chains are elevated with normal kappa lambda light chain ratio.  Immature reticulocyte fraction elevated at 20.7 -Proceed with Retacrit  10,000 units today and every 2 weeks and return to clinic in 8 weeks for follow-up.  2.  Iron  deficiency anemia: -Labs from 10/13/2023 show ferritin of 598, hemoglobin 8.9, MCV 102.0 with iron  saturations 15%.  In the setting of CKD recommend iron  sats closer to 30%.  -Recommend 2 doses of IV Venofer . -Will recheck labs in 6 to 8 weeks.  3.  B12 deficiency: -B12 237. MMA elevated at 720.  -We discussed replacing B12 weekly x 4 followed by monthly x  6.  PLAN SUMMARY: >> Retacrit  today. RTC every 2 weeks with lab and retacrit .  >> Recommend 2 doses of IV venofer .  >> Recommend B12 X 4 and monthly injections X 6.  >> RTC in 4 months with labs same day and office visit.       No orders of the defined types were placed in this encounter.   Stephanie FORBES Hope, NP   1/15/20253:30 PM  CHIEF COMPLAINT/PURPOSE OF CONSULT:   Diagnosis: Anemia from CKD.  Current Therapy: Retacrit  every 2 weeks  HISTORY OF PRESENT ILLNESS:   Stephanie Middleton is a 89 y.o. female presenting to clinic today for follow-up for anemia.  He was referred to us  by Dr. Rachele and recently saw Dr. Rogers.  She has been receiving Retacrit  every 2 weeks with Dr. Rachele and is switching over to our clinic.  She last received Retacrit  8000 units on 09/30/2023 at a different facility.  She is tolerating Retacrit  well.  She is here to review results from her initial visit with Dr. Rogers.  Overall, she feels well.  Appetite and energy levels are 100%.  Has pain to her right side and hip that is chronic.  Has chronic constipation and increased urinary frequency. Overall feels well.   No bleeding. No recent hospitalizations or surgeries.   PAST MEDICAL HISTORY:   Past Medical History: Past Medical History:  Diagnosis Date   Anemia  Chronic kidney disease    Chronic pain    Back, right hip and right knee   Diabetes mellitus without complication (HCC)    Diplopia    Hypercholesteremia    Hypertension     Surgical History: Past Surgical History:  Procedure Laterality Date   CHOLECYSTECTOMY     CYST EXCISION      Social History: Social History   Socioeconomic History   Marital status: Widowed    Spouse name: Not on file   Number of children: Not on file   Years of education: Not on file   Highest education level: Not on file  Occupational History   Not on file  Tobacco Use   Smoking status: Never   Smokeless tobacco: Never  Vaping Use    Vaping status: Never Used  Substance and Sexual Activity   Alcohol use: Never   Drug use: Never   Sexual activity: Not Currently    Birth control/protection: None  Other Topics Concern   Not on file  Social History Narrative   Lives with daughter   Social Drivers of Health   Financial Resource Strain: Not on file  Food Insecurity: No Food Insecurity (09/22/2023)   Hunger Vital Sign    Worried About Running Out of Food in the Last Year: Never true    Ran Out of Food in the Last Year: Never true  Transportation Needs: No Transportation Needs (09/22/2023)   PRAPARE - Administrator, Civil Service (Medical): No    Lack of Transportation (Non-Medical): No  Physical Activity: Not on file  Stress: Not on file  Social Connections: Not on file  Intimate Partner Violence: Not At Risk (09/22/2023)   Humiliation, Afraid, Rape, and Kick questionnaire    Fear of Current or Ex-Partner: No    Emotionally Abused: No    Physically Abused: No    Sexually Abused: No    Family History: Family History  Problem Relation Age of Onset   Leukemia Mother    Heart disease Father     Current Medications:  Current Outpatient Medications:    ACCU-CHEK GUIDE test strip, , Disp: , Rfl:    Accu-Chek Softclix Lancets lancets, , Disp: , Rfl:    acetaminophen (TYLENOL) 500 MG tablet, Take 500 mg by mouth 2 (two) times a day., Disp: , Rfl:    allopurinol (ZYLOPRIM) 100 MG tablet, Take 100 mg by mouth daily., Disp: , Rfl:    amLODipine (NORVASC) 5 MG tablet, Take 5 mg by mouth daily., Disp: , Rfl:    aspirin EC 81 MG tablet, Take 81 mg by mouth daily., Disp: , Rfl:    B-D ULTRAFINE III SHORT PEN 31G X 8 MM MISC, Inject into the skin as directed., Disp: , Rfl:    calcitRIOL (ROCALTROL) 0.25 MCG capsule, Take 0.25 mcg by mouth daily., Disp: , Rfl:    Cholecalciferol 125 MCG (5000 UT) capsule, Take 5,000 Units by mouth daily., Disp: , Rfl:    epoetin  alfa (EPOGEN ) 4000 UNIT/ML injection, Inject  8,000 Units into the skin every 14 (fourteen) days. , Disp: , Rfl:    furosemide (LASIX) 20 MG tablet, Take 20 mg by mouth., Disp: , Rfl:    glipiZIDE (GLUCOTROL) 5 MG tablet, Take by mouth 2 (two) times daily before a meal. Take 2 tablets twice daily, Disp: , Rfl:    iron  polysaccharides (NIFEREX) 150 MG capsule, Take 150 mg by mouth 2 (two) times daily., Disp: , Rfl:  labetalol (NORMODYNE) 200 MG tablet, Take 200 mg by mouth 2 (two) times daily. Take 2 tablets twice daily, Disp: , Rfl:    linagliptin (TRADJENTA) 5 MG TABS tablet, Take 5 mg by mouth daily., Disp: , Rfl:    pravastatin (PRAVACHOL) 40 MG tablet, Take 40 mg by mouth at bedtime., Disp: , Rfl:    RABEprazole (ACIPHEX) 20 MG tablet, Take 20 mg by mouth daily as needed. 1 tablet by mouth everyday as needed for indigestion, Disp: , Rfl:    TRESIBA FLEXTOUCH 100 UNIT/ML FlexTouch Pen, SMARTSIG:26 Unit(s) SUB-Q Daily, Disp: , Rfl:    Allergies: No Known Allergies  REVIEW OF SYSTEMS:   Review of Systems  Gastrointestinal:  Positive for constipation.  Genitourinary:  Positive for difficulty urinating (chronic) and frequency.   Musculoskeletal:  Positive for arthralgias.     VITALS:   Blood pressure (!) 172/69, pulse 71, temperature 98 F (36.7 C), temperature source Oral, resp. rate 16, weight 171 lb 11.8 oz (77.9 kg), SpO2 100%.  Wt Readings from Last 3 Encounters:  10/13/23 171 lb 11.8 oz (77.9 kg)  09/22/23 173 lb 3.2 oz (78.6 kg)  08/20/22 172 lb 2.9 oz (78.1 kg)    Body mass index is 31.41 kg/m.   PHYSICAL EXAM:   Physical Exam Constitutional:      Appearance: Normal appearance.  Cardiovascular:     Rate and Rhythm: Normal rate and regular rhythm.  Pulmonary:     Effort: Pulmonary effort is normal.     Breath sounds: Normal breath sounds.  Abdominal:     General: Bowel sounds are normal.     Palpations: Abdomen is soft.  Musculoskeletal:        General: No swelling. Normal range of motion.  Neurological:      Mental Status: She is alert and oriented to person, place, and time. Mental status is at baseline.     LABS:      Latest Ref Rng & Units 10/13/2023   10:09 AM 09/30/2023   10:44 AM 09/22/2023    1:58 PM  CBC  WBC 4.0 - 10.5 K/uL 4.6  4.2  4.8   Hemoglobin 12.0 - 15.0 g/dL 8.9  8.5  8.8   Hematocrit 36.0 - 46.0 % 29.9  26.4  28.9   Platelets 150 - 400 K/uL 208  210  223       Latest Ref Rng & Units 09/30/2023   10:44 AM 07/22/2023   10:17 AM 04/29/2023   10:03 AM  CMP  Glucose 65 - 99 mg/dL 897  845  897   BUN 7 - 25 mg/dL 68  66  71   Creatinine 0.60 - 0.95 mg/dL 6.80  6.52  6.40   Sodium 135 - 146 mmol/L 143  143  137   Potassium 3.5 - 5.3 mmol/L 4.3  4.5  4.5   Chloride 98 - 110 mmol/L 107  105  103   CO2 20 - 32 mmol/L 25  23  23    Calcium 8.6 - 10.4 mg/dL 9.7  9.5  9.4      No results found for: CEA1, CEA / No results found for: CEA1, CEA No results found for: PSA1 No results found for: CAN199 No results found for: RJW874  Lab Results  Component Value Date   TOTALPROTELP 7.2 09/22/2023   TOTALPROTELP 7.2 09/22/2023   ALBUMINELP 4.1 09/22/2023   A1GS 0.3 09/22/2023   A2GS 0.9 09/22/2023   BETS 1.4 (H) 09/22/2023  GAMS 0.5 09/22/2023   MSPIKE Not Observed 09/22/2023   SPEI Comment 09/22/2023   Lab Results  Component Value Date   TIBC 287 09/22/2023   TIBC 287 01/07/2023   TIBC 331 08/06/2022   FERRITIN 598 (H) 09/22/2023   FERRITIN 659 (H) 08/06/2022   FERRITIN 673 (H) 10/15/2021   IRONPCTSAT 15 09/22/2023   IRONPCTSAT 23 01/07/2023   IRONPCTSAT 20 08/06/2022   Lab Results  Component Value Date   LDH 134 09/22/2023     STUDIES:   No results found.

## 2023-10-14 ENCOUNTER — Encounter: Payer: Self-pay | Admitting: Hematology

## 2023-10-14 NOTE — Progress Notes (Signed)
 Opened in error

## 2023-10-15 DIAGNOSIS — E1122 Type 2 diabetes mellitus with diabetic chronic kidney disease: Secondary | ICD-10-CM | POA: Diagnosis not present

## 2023-10-15 DIAGNOSIS — E1129 Type 2 diabetes mellitus with other diabetic kidney complication: Secondary | ICD-10-CM | POA: Diagnosis not present

## 2023-10-15 DIAGNOSIS — R809 Proteinuria, unspecified: Secondary | ICD-10-CM | POA: Diagnosis not present

## 2023-10-15 DIAGNOSIS — N184 Chronic kidney disease, stage 4 (severe): Secondary | ICD-10-CM | POA: Diagnosis not present

## 2023-10-15 NOTE — Progress Notes (Signed)
  Subjective:  Patient ID: Stephanie Middleton, female    DOB: 06-13-1931,  MRN: [id number]  89 y.o. female presents at risk foot care. Pt has h/o NIDDM with chronic kidney disease and painful thick toenails that are difficult to trim. Pain interferes with ambulation. Aggravating factors include wearing enclosed shoe gear. Pain is relieved with periodic professional debridement.  She is accompanied by her daughter on today's visit.  PCP is Leigh Lung, MD , and last visit was April 27, 2023.  No Known Allergies  Review of Systems: Negative except as noted in the HPI.   Objective:  Stephanie Middleton is a pleasant 89 y.o. female WD, WN in NAD. AAO x 3.  Vascular Examination: Vascular status intact b/l with palpable pedal pulses. CFT immediate b/l. Pedal hair present. Trace edema noted b/l. No pain with calf compression b/l. Skin temperature gradient WNL b/l. No varicosities noted. No cyanosis or clubbing noted.  Neurological Examination: Sensation grossly intact b/l with 10 gram monofilament. Vibratory sensation intact b/l.  Dermatological Examination: Pedal skin with normal turgor, texture and tone b/l. No open wounds nor interdigital macerations noted. Toenails 1-5 b/l thick, discolored, elongated with subungual debris and pain on dorsal palpation. No hyperkeratotic lesions noted b/l.   Musculoskeletal Examination: Muscle strength 5/5 to b/l LE.  No pain, crepitus noted b/l. HAV with bunion bilaterally and hammertoes 2-5 b/l.  Radiographs: None  Last A1c:      Latest Ref Rng & Units 09/30/2023   10:44 AM  Hemoglobin A1C  Hemoglobin-A1c <5.7 % of total Hgb 5.9      Assessment:   1. Pain due to onychomycosis of toenails of both feet   2. Type 2 diabetes mellitus with diabetic nephropathy, with long-term current use of insulin (HCC)    Plan:  -Consent given for treatment as described below: -Examined patient. -Continue foot and shoe inspections daily. Monitor blood glucose per  PCP/Endocrinologist's recommendations. -Mycotic toenails 1-5 bilaterally were debrided in length and girth with sterile nail nippers and dremel without incident. -Patient/POA to call should there be question/concern in the interim.  Return in about 9 weeks (around 12/13/2023).  Stephanie Middleton, DPM      [place] LOCATION: [address] [place] [address]                   Office [phone]   [facility] LOCATION: [address] [place], [place] [address] Office [phone]

## 2023-10-21 ENCOUNTER — Inpatient Hospital Stay: Payer: Medicare PPO

## 2023-10-21 VITALS — BP 154/60 | HR 59 | Temp 97.8°F | Resp 16

## 2023-10-21 DIAGNOSIS — M25551 Pain in right hip: Secondary | ICD-10-CM | POA: Diagnosis not present

## 2023-10-21 DIAGNOSIS — D631 Anemia in chronic kidney disease: Secondary | ICD-10-CM

## 2023-10-21 DIAGNOSIS — N185 Chronic kidney disease, stage 5: Secondary | ICD-10-CM | POA: Diagnosis not present

## 2023-10-21 DIAGNOSIS — D509 Iron deficiency anemia, unspecified: Secondary | ICD-10-CM | POA: Diagnosis not present

## 2023-10-21 DIAGNOSIS — R768 Other specified abnormal immunological findings in serum: Secondary | ICD-10-CM | POA: Diagnosis not present

## 2023-10-21 DIAGNOSIS — Z79899 Other long term (current) drug therapy: Secondary | ICD-10-CM | POA: Diagnosis not present

## 2023-10-21 DIAGNOSIS — R350 Frequency of micturition: Secondary | ICD-10-CM | POA: Diagnosis not present

## 2023-10-21 DIAGNOSIS — K5909 Other constipation: Secondary | ICD-10-CM | POA: Diagnosis not present

## 2023-10-21 DIAGNOSIS — I120 Hypertensive chronic kidney disease with stage 5 chronic kidney disease or end stage renal disease: Secondary | ICD-10-CM | POA: Diagnosis not present

## 2023-10-21 MED ORDER — CETIRIZINE HCL 10 MG/ML IV SOLN
5.0000 mg | Freq: Once | INTRAVENOUS | Status: AC
  Administered 2023-10-21: 5 mg via INTRAVENOUS
  Filled 2023-10-21: qty 1

## 2023-10-21 MED ORDER — CYANOCOBALAMIN 1000 MCG/ML IJ SOLN
1000.0000 ug | INTRAMUSCULAR | Status: DC
  Administered 2023-10-21: 1000 ug via INTRAMUSCULAR
  Filled 2023-10-21: qty 1

## 2023-10-21 MED ORDER — SODIUM CHLORIDE 0.9 % IV SOLN: INTRAVENOUS | Status: DC

## 2023-10-21 MED ORDER — SODIUM CHLORIDE 0.9 % IV SOLN
300.0000 mg | Freq: Once | INTRAVENOUS | Status: AC
  Administered 2023-10-21: 300 mg via INTRAVENOUS
  Filled 2023-10-21: qty 300

## 2023-10-21 NOTE — Patient Instructions (Signed)
[facility] [place] - A DEPT OF [facility]  Discharge Instructions: Thank you for choosing [facility] to provide your oncology and hematology care.  If you have a lab appointment with the Cancer Center - please note that after April 8th, 2024, all labs will be drawn in the cancer center.  You do not have to check in or register with the main entrance as you have in the past but will complete your check-in in the cancer center.  Wear comfortable clothing and clothing appropriate for easy access to any Portacath or PICC line.   We strive to give you quality time with your provider. You may need to reschedule your appointment if you arrive late (15 or more minutes).  Arriving late affects you and other patients whose appointments are after yours.  Also, if you miss three or more appointments without notifying the office, you may be dismissed from the clinic at the provider's discretion.      For prescription refill requests, have your pharmacy contact our office and allow 72 hours for refills to be completed.    Today you received the following Venofer, and B12 injection return as scheduled.   To help prevent nausea and vomiting after your treatment, we encourage you to take your nausea medication as directed.  BELOW ARE SYMPTOMS THAT SHOULD BE REPORTED IMMEDIATELY: *FEVER GREATER THAN 100.4 F (38 C) OR HIGHER *CHILLS OR SWEATING *NAUSEA AND VOMITING THAT IS NOT CONTROLLED WITH YOUR NAUSEA MEDICATION *UNUSUAL SHORTNESS OF BREATH *UNUSUAL BRUISING OR BLEEDING *URINARY PROBLEMS (pain or burning when urinating, or frequent urination) *BOWEL PROBLEMS (unusual diarrhea, constipation, pain near the anus) TENDERNESS IN MOUTH AND THROAT WITH OR WITHOUT PRESENCE OF ULCERS (sore throat, sores in mouth, or a toothache) UNUSUAL RASH, SWELLING OR PAIN  UNUSUAL VAGINAL DISCHARGE OR ITCHING   Items with * indicate a potential emergency and should be followed up as  soon as possible or go to the Emergency Department if any problems should occur.  Please show the CHEMOTHERAPY ALERT CARD or IMMUNOTHERAPY ALERT CARD at check-in to the Emergency Department and triage nurse.  Should you have questions after your visit or need to cancel or reschedule your appointment, please contact [facility] [place] - A DEPT OF Eligha Bridegroom [facility] [phone]  and follow the prompts.  Office hours are 8:00 a.m. to 4:30 p.m. Monday - Friday. Please note that voicemails left after 4:00 p.m. may not be returned until the following business day.  We are closed weekends and major holidays. You have access to a nurse at all times for urgent questions. Please call the main number to the clinic [phone] and follow the prompts.  For any non-urgent questions, you may also contact your provider using MyChart. We now offer e-Visits for anyone 25 and older to request care online for non-urgent symptoms. For details visit [email or web address].   Also download the MyChart app! Go to the app store, search "MyChart", open the app, select [place], and log in with your MyChart username and password.

## 2023-10-21 NOTE — Progress Notes (Signed)
Patient tolerated iron infusion with no complaints voiced. Peripheral IV site clean and dry with good blood return noted before and after infusion. Band aid applied. Patient tolerated injection with no complaints voiced. Site clean and dry with no bruising or swelling noted at site. See MAR for details. Band aid applied.  Patient stable during and after injection. VSS with discharge and left in satisfactory condition with no s/s of distress noted.

## 2023-10-26 DIAGNOSIS — N184 Chronic kidney disease, stage 4 (severe): Secondary | ICD-10-CM | POA: Diagnosis not present

## 2023-10-26 DIAGNOSIS — M150 Primary generalized (osteo)arthritis: Secondary | ICD-10-CM | POA: Diagnosis not present

## 2023-10-26 DIAGNOSIS — I129 Hypertensive chronic kidney disease with stage 1 through stage 4 chronic kidney disease, or unspecified chronic kidney disease: Secondary | ICD-10-CM | POA: Diagnosis not present

## 2023-10-26 DIAGNOSIS — E1122 Type 2 diabetes mellitus with diabetic chronic kidney disease: Secondary | ICD-10-CM | POA: Diagnosis not present

## 2023-10-26 DIAGNOSIS — D508 Other iron deficiency anemias: Secondary | ICD-10-CM | POA: Diagnosis not present

## 2023-10-26 DIAGNOSIS — D631 Anemia in chronic kidney disease: Secondary | ICD-10-CM | POA: Diagnosis not present

## 2023-10-26 DIAGNOSIS — D510 Vitamin B12 deficiency anemia due to intrinsic factor deficiency: Secondary | ICD-10-CM | POA: Diagnosis not present

## 2023-10-27 ENCOUNTER — Inpatient Hospital Stay: Payer: Medicare PPO

## 2023-10-27 VITALS — BP 151/65 | HR 66 | Temp 96.7°F | Resp 18

## 2023-10-27 DIAGNOSIS — N185 Chronic kidney disease, stage 5: Secondary | ICD-10-CM | POA: Diagnosis not present

## 2023-10-27 DIAGNOSIS — D631 Anemia in chronic kidney disease: Secondary | ICD-10-CM

## 2023-10-27 DIAGNOSIS — Z79899 Other long term (current) drug therapy: Secondary | ICD-10-CM | POA: Diagnosis not present

## 2023-10-27 DIAGNOSIS — I120 Hypertensive chronic kidney disease with stage 5 chronic kidney disease or end stage renal disease: Secondary | ICD-10-CM | POA: Diagnosis not present

## 2023-10-27 DIAGNOSIS — M25551 Pain in right hip: Secondary | ICD-10-CM | POA: Diagnosis not present

## 2023-10-27 DIAGNOSIS — R768 Other specified abnormal immunological findings in serum: Secondary | ICD-10-CM | POA: Diagnosis not present

## 2023-10-27 DIAGNOSIS — R350 Frequency of micturition: Secondary | ICD-10-CM | POA: Diagnosis not present

## 2023-10-27 DIAGNOSIS — K5909 Other constipation: Secondary | ICD-10-CM | POA: Diagnosis not present

## 2023-10-27 DIAGNOSIS — D509 Iron deficiency anemia, unspecified: Secondary | ICD-10-CM | POA: Diagnosis not present

## 2023-10-27 LAB — CBC
HCT: 28.6 % — ABNORMAL LOW (ref 36.0–46.0)
Hemoglobin: 8.7 g/dL — ABNORMAL LOW (ref 12.0–15.0)
MCH: 31.2 pg (ref 26.0–34.0)
MCHC: 30.4 g/dL (ref 30.0–36.0)
MCV: 102.5 fL — ABNORMAL HIGH (ref 80.0–100.0)
Platelets: 189 10*3/uL (ref 150–400)
RBC: 2.79 MIL/uL — ABNORMAL LOW (ref 3.87–5.11)
RDW: 15.5 % (ref 11.5–15.5)
WBC: 4.7 10*3/uL (ref 4.0–10.5)
nRBC: 0 % (ref 0.0–0.2)

## 2023-10-27 MED ORDER — EPOETIN ALFA 10000 UNIT/ML IJ SOLN
10000.0000 [IU] | Freq: Once | INTRAMUSCULAR | Status: AC
Start: 2023-10-27 — End: 2023-10-27
  Administered 2023-10-27: 10000 [IU] via INTRAVENOUS
  Filled 2023-10-27: qty 1

## 2023-10-27 NOTE — Patient Instructions (Signed)
 [facility] [place] - A DEPT OF  [facility]  Discharge Instructions: Thank you for choosing [facility] to provide your oncology and hematology care.  If you have a lab appointment with the Cancer Center - please note that after April 8th, 2024, all labs will be drawn in the cancer center.  You do not have to check in or register with the main entrance as you have in the past but will complete your check-in in the cancer center.  Wear comfortable clothing and clothing appropriate for easy access to any Portacath or PICC line.   We strive to give you quality time with your provider. You may need to reschedule your appointment if you arrive late (15 or more minutes).  Arriving late affects you and other patients whose appointments are after yours.  Also, if you miss three or more appointments without notifying the office, you may be dismissed from the clinic at the provider's discretion.      For prescription refill requests, have your pharmacy contact our office and allow 72 hours for refills to be completed.    Today you received the following chemotherapy and/or immunotherapy agents Retacrit injection. Epoetin Alfa Injection What is this medication? EPOETIN ALFA (e POE e tin AL fa) treats low levels of red blood cells (anemia) caused by kidney disease, chemotherapy, or HIV medications. It can also be used in people who are at risk for blood loss during surgery. It works by Systems analyst make more red blood cells, which reduces the need for blood transfusions. This medicine may be used for other purposes; ask your health care provider or pharmacist if you have questions. COMMON BRAND NAME(S): Epogen, Procrit, Retacrit What should I tell my care team before I take this medication? They need to know if you have any of these conditions: Blood clots Cancer Heart disease High blood pressure On dialysis Seizures Stroke An unusual or allergic reaction to  epoetin alfa, albumin, benzyl alcohol, other medications, foods, dyes, or preservatives Pregnant or trying to get pregnant Breast-feeding How should I use this medication? This medication is injected into a vein or under the skin. It is usually given by your care team in a hospital or clinic setting. It may also be given at home. If you get this medication at home, you will be taught how to prepare and give it. Use exactly as directed. Take it as directed on the prescription label at the same time every day. Keep taking it unless your care team tells you to stop. It is important that you put your used needles and syringes in a special sharps container. Do not put them in a trash can. If you do not have a sharps container, call your pharmacist or care team to get one. A special MedGuide will be given to you by the pharmacist with each prescription and refill. Be sure to read this information carefully each time. Talk to your care team about the use of this medication in children. While this medication may be used in children as young as 1 month of age for selected conditions, precautions do apply. Overdosage: If you think you have taken too much of this medicine contact a poison control center or emergency room at once. NOTE: This medicine is only for you. Do not share this medicine with others. What if I miss a dose? If you miss a dose, take it as soon as you can. If it is almost time  for your next dose, take only that dose. Do not take double or extra doses. What may interact with this medication? Darbepoetin alfa Methoxy polyethylene glycol-epoetin beta This list may not describe all possible interactions. Give your health care provider a list of all the medicines, herbs, non-prescription drugs, or dietary supplements you use. Also tell them if you smoke, drink alcohol, or use illegal drugs. Some items may interact with your medicine. What should I watch for while using this medication? Visit  your care team for regular checks on your progress. Check your blood pressure as directed. Know what your blood pressure should be and when to contact your care team. Your condition will be monitored carefully while you are receiving this medication. You may need blood work while taking this medication. What side effects may I notice from receiving this medication? Side effects that you should report to your care team as soon as possible: Allergic reactions--skin rash, itching, hives, swelling of the face, lips, tongue, or throat Blood clot--pain, swelling, or warmth in the leg, shortness of breath, chest pain Heart attack--pain or tightness in the chest, shoulders, arms, or jaw, nausea, shortness of breath, cold or clammy skin, feeling faint or lightheaded Increase in blood pressure Rash, fever, and swollen lymph nodes Redness, blistering, peeling, or loosening of the skin, including inside the mouth Seizures Stroke--sudden numbness or weakness of the face, arm, or leg, trouble speaking, confusion, trouble walking, loss of balance or coordination, dizziness, severe headache, change in vision Side effects that usually do not require medical attention (report to your care team if they continue or are bothersome): Bone, joint, or muscle pain Cough Headache Nausea Pain, redness, or irritation at injection site This list may not describe all possible side effects. Call your doctor for medical advice about side effects. You may report side effects to FDA at 1-800-FDA-1088. Where should I keep my medication? Keep out of the reach of children and pets. Store in a refrigerator. Do not freeze. Do not shake. Protect from light. Keep this medication in the original container until you are ready to take it. See product for storage information. Get rid of any unused medication after the expiration date. To get rid of medications that are no longer needed or have expired: Take the medication to a medication  take-back program. Check with your pharmacy or law enforcement to find a location. If you cannot return the medication, ask your pharmacist or care team how to get rid of the medication safely. NOTE: This sheet is a summary. It may not cover all possible information. If you have questions about this medicine, talk to your doctor, pharmacist, or health care provider.  2024 Elsevier/Gold Standard (2022-01-23 00:00:00)       To help prevent nausea and vomiting after your treatment, we encourage you to take your nausea medication as directed.  BELOW ARE SYMPTOMS THAT SHOULD BE REPORTED IMMEDIATELY: *FEVER GREATER THAN 100.4 F (38 C) OR HIGHER *CHILLS OR SWEATING *NAUSEA AND VOMITING THAT IS NOT CONTROLLED WITH YOUR NAUSEA MEDICATION *UNUSUAL SHORTNESS OF BREATH *UNUSUAL BRUISING OR BLEEDING *URINARY PROBLEMS (pain or burning when urinating, or frequent urination) *BOWEL PROBLEMS (unusual diarrhea, constipation, pain near the anus) TENDERNESS IN MOUTH AND THROAT WITH OR WITHOUT PRESENCE OF ULCERS (sore throat, sores in mouth, or a toothache) UNUSUAL RASH, SWELLING OR PAIN  UNUSUAL VAGINAL DISCHARGE OR ITCHING   Items with * indicate a potential emergency and should be followed up as soon as possible or go to the Emergency  Department if any problems should occur.  Please show the CHEMOTHERAPY ALERT CARD or IMMUNOTHERAPY ALERT CARD at check-in to the Emergency Department and triage nurse.  Should you have questions after your visit or need to cancel or reschedule your appointment, please contact [facility] [place] - A DEPT OF Eligha Bridegroom [facility] [phone]  and follow the prompts.  Office hours are 8:00 a.m. to 4:30 p.m. Monday - Friday. Please note that voicemails left after 4:00 p.m. may not be returned until the following business day.  We are closed weekends and major holidays. You have access to a nurse at all times for urgent questions. Please call the main number to the  clinic [phone] and follow the prompts.  For any non-urgent questions, you may also contact your provider using MyChart. We now offer e-Visits for anyone 86 and older to request care online for non-urgent symptoms. For details visit [email or web address].   Also download the MyChart app! Go to the app store, search "MyChart", open the app, select [place], and log in with your MyChart username and password.

## 2023-10-27 NOTE — Progress Notes (Signed)
Patient presents today for Retacrit injection. HGB is 8.7. Blood pressure within parameters for treatment.   Retacrit injection given today per MD orders. Tolerated without adverse affects. Vital signs stable. No complaints at this time. Discharged from clinic by wheel chair in stable condition. Alert and oriented x 3. F/U with [facility] as scheduled.

## 2023-10-29 ENCOUNTER — Inpatient Hospital Stay: Payer: Medicare PPO

## 2023-10-29 VITALS — BP 145/78 | HR 65 | Temp 96.5°F | Resp 19

## 2023-10-29 DIAGNOSIS — D631 Anemia in chronic kidney disease: Secondary | ICD-10-CM | POA: Diagnosis not present

## 2023-10-29 DIAGNOSIS — R350 Frequency of micturition: Secondary | ICD-10-CM | POA: Diagnosis not present

## 2023-10-29 DIAGNOSIS — D509 Iron deficiency anemia, unspecified: Secondary | ICD-10-CM | POA: Diagnosis not present

## 2023-10-29 DIAGNOSIS — N185 Chronic kidney disease, stage 5: Secondary | ICD-10-CM | POA: Diagnosis not present

## 2023-10-29 DIAGNOSIS — I120 Hypertensive chronic kidney disease with stage 5 chronic kidney disease or end stage renal disease: Secondary | ICD-10-CM | POA: Diagnosis not present

## 2023-10-29 DIAGNOSIS — R768 Other specified abnormal immunological findings in serum: Secondary | ICD-10-CM | POA: Diagnosis not present

## 2023-10-29 DIAGNOSIS — M25551 Pain in right hip: Secondary | ICD-10-CM | POA: Diagnosis not present

## 2023-10-29 DIAGNOSIS — K5909 Other constipation: Secondary | ICD-10-CM | POA: Diagnosis not present

## 2023-10-29 DIAGNOSIS — Z79899 Other long term (current) drug therapy: Secondary | ICD-10-CM | POA: Diagnosis not present

## 2023-10-29 MED ORDER — CETIRIZINE HCL 10 MG/ML IV SOLN
5.0000 mg | Freq: Once | INTRAVENOUS | Status: AC
  Administered 2023-10-29: 5 mg via INTRAVENOUS
  Filled 2023-10-29: qty 1

## 2023-10-29 MED ORDER — CYANOCOBALAMIN 1000 MCG/ML IJ SOLN
1000.0000 ug | INTRAMUSCULAR | Status: DC
  Administered 2023-10-29: 1000 ug via INTRAMUSCULAR
  Filled 2023-10-29: qty 1

## 2023-10-29 MED ORDER — SODIUM CHLORIDE 0.9 % IV SOLN: INTRAVENOUS | Status: DC

## 2023-10-29 MED ORDER — IRON SUCROSE 20 MG/ML IV SOLN
300.0000 mg | Freq: Once | INTRAVENOUS | Status: AC
  Administered 2023-10-29: 300 mg via INTRAVENOUS
  Filled 2023-10-29: qty 300

## 2023-10-29 NOTE — Progress Notes (Signed)
Patient tolerated iron infusion with no complaints voiced.  Peripheral IV site clean and dry with good blood return noted before and after infusion.  Band aid applied. Pt observed for 30 minutes post iron infusion without any complications.  VSS with discharge and left in satisfactory condition with no s/s of distress noted. All follow ups as scheduled.   Stephanie Middleton [employer]

## 2023-10-29 NOTE — Patient Instructions (Signed)

## 2023-11-05 ENCOUNTER — Inpatient Hospital Stay: Payer: Medicare PPO

## 2023-11-05 VITALS — BP 149/69 | HR 66 | Temp 98.5°F | Resp 18

## 2023-11-05 DIAGNOSIS — R768 Other specified abnormal immunological findings in serum: Secondary | ICD-10-CM | POA: Diagnosis not present

## 2023-11-05 DIAGNOSIS — D509 Iron deficiency anemia, unspecified: Secondary | ICD-10-CM | POA: Diagnosis not present

## 2023-11-05 DIAGNOSIS — N185 Chronic kidney disease, stage 5: Secondary | ICD-10-CM | POA: Diagnosis not present

## 2023-11-05 DIAGNOSIS — I120 Hypertensive chronic kidney disease with stage 5 chronic kidney disease or end stage renal disease: Secondary | ICD-10-CM | POA: Diagnosis not present

## 2023-11-05 DIAGNOSIS — K5909 Other constipation: Secondary | ICD-10-CM | POA: Diagnosis not present

## 2023-11-05 DIAGNOSIS — D631 Anemia in chronic kidney disease: Secondary | ICD-10-CM | POA: Diagnosis not present

## 2023-11-05 DIAGNOSIS — R350 Frequency of micturition: Secondary | ICD-10-CM | POA: Diagnosis not present

## 2023-11-05 DIAGNOSIS — Z79899 Other long term (current) drug therapy: Secondary | ICD-10-CM | POA: Diagnosis not present

## 2023-11-05 DIAGNOSIS — M25551 Pain in right hip: Secondary | ICD-10-CM | POA: Diagnosis not present

## 2023-11-05 MED ORDER — CYANOCOBALAMIN 1000 MCG/ML IJ SOLN
1000.0000 ug | INTRAMUSCULAR | Status: DC
  Administered 2023-11-05: 1000 ug via INTRAMUSCULAR
  Filled 2023-11-05: qty 1

## 2023-11-05 NOTE — Patient Instructions (Signed)
 [facility] [place] - A DEPT [facility]  Discharge Instructions: Thank you for choosing [facility] to provide your oncology and hematology care.  If you have a lab appointment with the Cancer Center - please note that after April 8th, 2024, all labs will be drawn in the cancer center.  You do not have to check in or register with the main entrance as you have in the past but will complete your check-in in the cancer center.  Wear comfortable clothing and clothing appropriate for easy access to any Portacath or PICC line.   We strive to give you quality time with your provider. You may need to reschedule your appointment if you arrive late (15 or more minutes).  Arriving late affects you and other patients whose appointments are after yours.  Also, if you miss three or more appointments without notifying the office, you may be dismissed from the clinic at the provider's discretion.      For prescription refill requests, have your pharmacy contact our office and allow 72 hours for refills to be completed.    Today you received the following chemotherapy and/or immunotherapy agents B12      To help prevent nausea and vomiting after your treatment, we encourage you to take your nausea medication as directed.  BELOW ARE SYMPTOMS THAT SHOULD BE REPORTED IMMEDIATELY: *FEVER GREATER THAN 100.4 F (38 C) OR HIGHER *CHILLS OR SWEATING *NAUSEA AND VOMITING THAT IS NOT CONTROLLED WITH YOUR NAUSEA MEDICATION *UNUSUAL SHORTNESS OF BREATH *UNUSUAL BRUISING OR BLEEDING *URINARY PROBLEMS (pain or burning when urinating, or frequent urination) *BOWEL PROBLEMS (unusual diarrhea, constipation, pain near the anus) TENDERNESS IN MOUTH AND THROAT WITH OR WITHOUT PRESENCE OF ULCERS (sore throat, sores in mouth, or a toothache) UNUSUAL RASH, SWELLING OR PAIN  UNUSUAL VAGINAL DISCHARGE OR ITCHING   Items with * indicate a potential emergency and should be followed up as  soon as possible or go to the Emergency Department if any problems should occur.  Please show the CHEMOTHERAPY ALERT CARD or IMMUNOTHERAPY ALERT CARD at check-in to the Emergency Department and triage nurse.  Should you have questions after your visit or need to cancel or reschedule your appointment, please contact [facility] [place] - A DEPT OF Eligha Bridegroom [facility] [phone]  and follow the prompts.  Office hours are 8:00 a.m. to 4:30 p.m. Monday - Friday. Please note that voicemails left after 4:00 p.m. may not be returned until the following business day.  We are closed weekends and major holidays. You have access to a nurse at all times for urgent questions. Please call the main number to the clinic [phone] and follow the prompts.  For any non-urgent questions, you may also contact your provider using MyChart. We now offer e-Visits for anyone 91 and older to request care online for non-urgent symptoms. For details visit [email or web address].   Also download the MyChart app! Go to the app store, search "MyChart", open the app, select [place], and log in with your MyChart username and password.

## 2023-11-05 NOTE — Progress Notes (Signed)
Patient presents today for B12 injection per providers order. Vital signs WNL.  Patient has no new complaints at this time.  Stable duirng administration without incident; injection site WNL; see MAR for injection details.  Patient tolerated procedure well and without incident.  No questions or complaints noted at this time.

## 2023-11-10 ENCOUNTER — Inpatient Hospital Stay: Payer: Medicare PPO

## 2023-11-10 ENCOUNTER — Inpatient Hospital Stay: Payer: Medicare PPO | Attending: Hematology

## 2023-11-10 VITALS — BP 136/59 | HR 64 | Temp 98.8°F | Resp 17

## 2023-11-10 DIAGNOSIS — D631 Anemia in chronic kidney disease: Secondary | ICD-10-CM | POA: Diagnosis not present

## 2023-11-10 DIAGNOSIS — Z79899 Other long term (current) drug therapy: Secondary | ICD-10-CM | POA: Diagnosis not present

## 2023-11-10 DIAGNOSIS — N185 Chronic kidney disease, stage 5: Secondary | ICD-10-CM | POA: Insufficient documentation

## 2023-11-10 DIAGNOSIS — I120 Hypertensive chronic kidney disease with stage 5 chronic kidney disease or end stage renal disease: Secondary | ICD-10-CM | POA: Diagnosis not present

## 2023-11-10 LAB — CBC
HCT: 28.6 % — ABNORMAL LOW (ref 36.0–46.0)
Hemoglobin: 8.9 g/dL — ABNORMAL LOW (ref 12.0–15.0)
MCH: 31.6 pg (ref 26.0–34.0)
MCHC: 31.1 g/dL (ref 30.0–36.0)
MCV: 101.4 fL — ABNORMAL HIGH (ref 80.0–100.0)
Platelets: 183 10*3/uL (ref 150–400)
RBC: 2.82 MIL/uL — ABNORMAL LOW (ref 3.87–5.11)
RDW: 15.8 % — ABNORMAL HIGH (ref 11.5–15.5)
WBC: 4.6 10*3/uL (ref 4.0–10.5)
nRBC: 0 % (ref 0.0–0.2)

## 2023-11-10 MED ORDER — EPOETIN ALFA 10000 UNIT/ML IJ SOLN
10000.0000 [IU] | Freq: Once | INTRAMUSCULAR | Status: AC
Start: 2023-11-10 — End: 2023-11-10
  Administered 2023-11-10: 10000 [IU] via INTRAVENOUS
  Filled 2023-11-10: qty 1

## 2023-11-10 MED ORDER — CYANOCOBALAMIN 1000 MCG/ML IJ SOLN
1000.0000 ug | INTRAMUSCULAR | Status: DC
  Administered 2023-11-10: 1000 ug via INTRAMUSCULAR
  Filled 2023-11-10: qty 1

## 2023-11-10 NOTE — Progress Notes (Signed)
 Patient presents today for Epogen  and last weekly B12 injection. Blood pressure within parameters for treatment. HGB 8.9.    Stephanie Middleton presents today for injection per the provider's orders.  B12 and Epogen   administration without incident; injection site WNL; see MAR for injection details.  Patient tolerated procedure well and without incident.  No questions or complaints noted at this time.   Discharged from clinic by wheel chair. Scheduling notified to cancel patient's B12 injection appointment on Friday. Discharged in stable condition by wheel chair. Alert and oriented x 3. F/U with [facility] as scheduled.

## 2023-11-10 NOTE — Patient Instructions (Signed)
 [facility] [place] - A DEPT OF [place]  Discharge Instructions: Thank you for choosing [facility] to provide your oncology and hematology care.  If you have a lab appointment with the Cancer Center - please note that after April 8th, 2024, all labs will be drawn in the cancer center.  You do not have to check in or register with the main entrance as you have in the past but will complete your check-in in the cancer center.  Wear comfortable clothing and clothing appropriate for easy access to any Portacath or PICC line.   We strive to give you quality time with your provider. You may need to reschedule your appointment if you arrive late (15 or more minutes).  Arriving late affects you and other patients whose appointments are after yours.  Also, if you miss three or more appointments without notifying the office, you may be dismissed from the clinic at the provider's discretion.      For prescription refill requests, have your pharmacy contact our office and allow 72 hours for refills to be completed.    Today you received the following chemotherapy and/or immunotherapy agents Epogen  and Vitamin B12 injection. Vitamin B12 Injection What is this medication? Vitamin B12 (VAHY tuh min B12) prevents and treats low vitamin B12 levels in your body. It is used in people who do not get enough vitamin B12 from their diet or when their digestive tract does not absorb enough. Vitamin B12 plays an important role in maintaining the health of your nervous system and red blood cells. This medicine may be used for other purposes; ask your health care provider or pharmacist if you have questions. COMMON BRAND NAME(S): B-12 Compliance Kit, B-12 Injection Kit, Cyomin, Dodex , LA-12, Nutri-Twelve, Physicians EZ Use B-12, Primabalt, Vitamin Deficiency Injectable System - B12 What should I tell my care team before I take this medication? They need to know if you have any of  these conditions: Kidney disease Leber's disease Megaloblastic anemia An unusual or allergic reaction to cyanocobalamin , cobalt, other medications, foods, dyes, or preservatives Pregnant or trying to get pregnant Breast-feeding How should I use this medication? This medication is injected into a muscle or deeply under the skin. It is usually given in a clinic or care team's office. However, your care team may teach you how to inject yourself. Follow all instructions. Talk to your care team about the use of this medication in children. Special care may be needed. Overdosage: If you think you have taken too much of this medicine contact a poison control center or emergency room at once. NOTE: This medicine is only for you. Do not share this medicine with others. What if I miss a dose? If you are given your dose at a clinic or care team's office, call to reschedule your appointment. If you give your own injections, and you miss a dose, take it as soon as you can. If it is almost time for your next dose, take only that dose. Do not take double or extra doses. What may interact with this medication? Alcohol Colchicine This list may not describe all possible interactions. Give your health care provider a list of all the medicines, herbs, non-prescription drugs, or dietary supplements you use. Also tell them if you smoke, drink alcohol, or use illegal drugs. Some items may interact with your medicine. What should I watch for while using this medication? Visit your care team regularly. You may need blood  work done while you are taking this medication. You may need to follow a special diet. Talk to your care team. Limit your alcohol intake and avoid smoking to get the best benefit. What side effects may I notice from receiving this medication? Side effects that you should report to your care team as soon as possible: Allergic reactions--skin rash, itching, hives, swelling of the face, lips, tongue, or  throat Swelling of the ankles, hands, or feet Trouble breathing Side effects that usually do not require medical attention (report to your care team if they continue or are bothersome): Diarrhea This list may not describe all possible side effects. Call your doctor for medical advice about side effects. You may report side effects to FDA at 1-800-FDA-1088. Where should I keep my medication? Keep out of the reach of children. Store at room temperature between 15 and 30 degrees C (59 and 85 degrees F). Protect from light. Throw away any unused medication after the expiration date. NOTE: This sheet is a summary. It may not cover all possible information. If you have questions about this medicine, talk to your doctor, pharmacist, or health care provider.  2024 Elsevier/Gold Standard (2021-06-03 00:00:00)      To help prevent nausea and vomiting after your treatment, we encourage you to take your nausea medication as directed.  BELOW ARE SYMPTOMS THAT SHOULD BE REPORTED IMMEDIATELY: *FEVER GREATER THAN 100.4 F (38 C) OR HIGHER *CHILLS OR SWEATING *NAUSEA AND VOMITING THAT IS NOT CONTROLLED WITH YOUR NAUSEA MEDICATION *UNUSUAL SHORTNESS OF BREATH *UNUSUAL BRUISING OR BLEEDING *URINARY PROBLEMS (pain or burning when urinating, or frequent urination) *BOWEL PROBLEMS (unusual diarrhea, constipation, pain near the anus) TENDERNESS IN MOUTH AND THROAT WITH OR WITHOUT PRESENCE OF ULCERS (sore throat, sores in mouth, or a toothache) UNUSUAL RASH, SWELLING OR PAIN  UNUSUAL VAGINAL DISCHARGE OR ITCHING   Items with * indicate a potential emergency and should be followed up as soon as possible or go to the Emergency Department if any problems should occur.  Please show the CHEMOTHERAPY ALERT CARD or IMMUNOTHERAPY ALERT CARD at check-in to the Emergency Department and triage nurse.  Should you have questions after your visit or need to cancel or reschedule your appointment, please contact [facility] CANCER  CTR [place] - A DEPT OF [facility] [phone]  and follow the prompts.  Office hours are 8:00 a.m. to 4:30 p.m. Monday - Friday. Please note that voicemails left after 4:00 p.m. may not be returned until the following business day.  We are closed weekends and major holidays. You have access to a nurse at all times for urgent questions. Please call the main number to the clinic [phone] and follow the prompts.  For any non-urgent questions, you may also contact your provider using MyChart. We now offer e-Visits for anyone 68 and older to request care online for non-urgent symptoms. For details visit [email or web address].   Also download the MyChart app! Go to the app store, search MyChart, open the app, select [place], and log in with your MyChart username and password.

## 2023-11-12 ENCOUNTER — Inpatient Hospital Stay: Payer: Medicare PPO

## 2023-11-23 ENCOUNTER — Inpatient Hospital Stay: Payer: Medicare PPO

## 2023-11-23 VITALS — BP 158/63 | HR 65 | Temp 97.0°F | Resp 19

## 2023-11-23 DIAGNOSIS — N185 Chronic kidney disease, stage 5: Secondary | ICD-10-CM | POA: Diagnosis not present

## 2023-11-23 DIAGNOSIS — D631 Anemia in chronic kidney disease: Secondary | ICD-10-CM

## 2023-11-23 DIAGNOSIS — Z79899 Other long term (current) drug therapy: Secondary | ICD-10-CM | POA: Diagnosis not present

## 2023-11-23 DIAGNOSIS — I120 Hypertensive chronic kidney disease with stage 5 chronic kidney disease or end stage renal disease: Secondary | ICD-10-CM | POA: Diagnosis not present

## 2023-11-23 LAB — CBC
HCT: 29.2 % — ABNORMAL LOW (ref 36.0–46.0)
Hemoglobin: 9 g/dL — ABNORMAL LOW (ref 12.0–15.0)
MCH: 31.4 pg (ref 26.0–34.0)
MCHC: 30.8 g/dL (ref 30.0–36.0)
MCV: 101.7 fL — ABNORMAL HIGH (ref 80.0–100.0)
Platelets: 183 10*3/uL (ref 150–400)
RBC: 2.87 MIL/uL — ABNORMAL LOW (ref 3.87–5.11)
RDW: 15.5 % (ref 11.5–15.5)
WBC: 4.6 10*3/uL (ref 4.0–10.5)
nRBC: 0 % (ref 0.0–0.2)

## 2023-11-23 MED ORDER — EPOETIN ALFA 10000 UNIT/ML IJ SOLN
10000.0000 [IU] | Freq: Once | INTRAMUSCULAR | Status: DC
Start: 2023-11-23 — End: 2023-11-23
  Filled 2023-11-23: qty 1

## 2023-11-23 MED ORDER — EPOETIN ALFA 10000 UNIT/ML IJ SOLN
10000.0000 [IU] | Freq: Once | INTRAMUSCULAR | Status: AC
  Administered 2023-11-23: 10000 [IU] via SUBCUTANEOUS

## 2023-11-23 NOTE — Patient Instructions (Signed)
[facility] [place] - A DEPT OF [facility]  Discharge Instructions: Thank you for choosing [facility] to provide your oncology and hematology care.  If you have a lab appointment with the Cancer Center - please note that after April 8th, 2024, all labs will be drawn in the cancer center.  You do not have to check in or register with the main entrance as you have in the past but will complete your check-in in the cancer center.  Wear comfortable clothing and clothing appropriate for easy access to any Portacath or PICC line.   We strive to give you quality time with your provider. You may need to reschedule your appointment if you arrive late (15 or more minutes).  Arriving late affects you and other patients whose appointments are after yours.  Also, if you miss three or more appointments without notifying the office, you may be dismissed from the clinic at the provider's discretion.      For prescription refill requests, have your pharmacy contact our office and allow 72 hours for refills to be completed.    Today you received the following chemotherapy and/or immunotherapy agents Epogen. Epoetin Alfa Injection What is this medication? EPOETIN ALFA (e POE e tin AL fa) treats low levels of red blood cells (anemia) caused by kidney disease, chemotherapy, or HIV medications. It can also be used in people who are at risk for blood loss during surgery. It works by Systems analyst make more red blood cells, which reduces the need for blood transfusions. This medicine may be used for other purposes; ask your health care provider or pharmacist if you have questions. COMMON BRAND NAME(S): Epogen, Procrit, Retacrit What should I tell my care team before I take this medication? They need to know if you have any of these conditions: Blood clots Cancer Heart disease High blood pressure On dialysis Seizures Stroke An unusual or allergic reaction to epoetin alfa,  albumin, benzyl alcohol, other medications, foods, dyes, or preservatives Pregnant or trying to get pregnant Breast-feeding How should I use this medication? This medication is injected into a vein or under the skin. It is usually given by your care team in a hospital or clinic setting. It may also be given at home. If you get this medication at home, you will be taught how to prepare and give it. Use exactly as directed. Take it as directed on the prescription label at the same time every day. Keep taking it unless your care team tells you to stop. It is important that you put your used needles and syringes in a special sharps container. Do not put them in a trash can. If you do not have a sharps container, call your pharmacist or care team to get one. A special MedGuide will be given to you by the pharmacist with each prescription and refill. Be sure to read this information carefully each time. Talk to your care team about the use of this medication in children. While this medication may be used in children as young as 1 month of age for selected conditions, precautions do apply. Overdosage: If you think you have taken too much of this medicine contact a poison control center or emergency room at once. NOTE: This medicine is only for you. Do not share this medicine with others. What if I miss a dose? If you miss a dose, take it as soon as you can. If it is almost time for  your next dose, take only that dose. Do not take double or extra doses. What may interact with this medication? Darbepoetin alfa Methoxy polyethylene glycol-epoetin beta This list may not describe all possible interactions. Give your health care provider a list of all the medicines, herbs, non-prescription drugs, or dietary supplements you use. Also tell them if you smoke, drink alcohol, or use illegal drugs. Some items may interact with your medicine. What should I watch for while using this medication? Visit your care team  for regular checks on your progress. Check your blood pressure as directed. Know what your blood pressure should be and when to contact your care team. Your condition will be monitored carefully while you are receiving this medication. You may need blood work while taking this medication. What side effects may I notice from receiving this medication? Side effects that you should report to your care team as soon as possible: Allergic reactions--skin rash, itching, hives, swelling of the face, lips, tongue, or throat Blood clot--pain, swelling, or warmth in the leg, shortness of breath, chest pain Heart attack--pain or tightness in the chest, shoulders, arms, or jaw, nausea, shortness of breath, cold or clammy skin, feeling faint or lightheaded Increase in blood pressure Rash, fever, and swollen lymph nodes Redness, blistering, peeling, or loosening of the skin, including inside the mouth Seizures Stroke--sudden numbness or weakness of the face, arm, or leg, trouble speaking, confusion, trouble walking, loss of balance or coordination, dizziness, severe headache, change in vision Side effects that usually do not require medical attention (report to your care team if they continue or are bothersome): Bone, joint, or muscle pain Cough Headache Nausea Pain, redness, or irritation at injection site This list may not describe all possible side effects. Call your doctor for medical advice about side effects. You may report side effects to FDA at 1-800-FDA-1088. Where should I keep my medication? Keep out of the reach of children and pets. Store in a refrigerator. Do not freeze. Do not shake. Protect from light. Keep this medication in the original container until you are ready to take it. See product for storage information. Get rid of any unused medication after the expiration date. To get rid of medications that are no longer needed or have expired: Take the medication to a medication take-back  program. Check with your pharmacy or law enforcement to find a location. If you cannot return the medication, ask your pharmacist or care team how to get rid of the medication safely. NOTE: This sheet is a summary. It may not cover all possible information. If you have questions about this medicine, talk to your doctor, pharmacist, or health care provider.  2024 Elsevier/Gold Standard (2022-01-23 00:00:00)      To help prevent nausea and vomiting after your treatment, we encourage you to take your nausea medication as directed.  BELOW ARE SYMPTOMS THAT SHOULD BE REPORTED IMMEDIATELY: *FEVER GREATER THAN 100.4 F (38 C) OR HIGHER *CHILLS OR SWEATING *NAUSEA AND VOMITING THAT IS NOT CONTROLLED WITH YOUR NAUSEA MEDICATION *UNUSUAL SHORTNESS OF BREATH *UNUSUAL BRUISING OR BLEEDING *URINARY PROBLEMS (pain or burning when urinating, or frequent urination) *BOWEL PROBLEMS (unusual diarrhea, constipation, pain near the anus) TENDERNESS IN MOUTH AND THROAT WITH OR WITHOUT PRESENCE OF ULCERS (sore throat, sores in mouth, or a toothache) UNUSUAL RASH, SWELLING OR PAIN  UNUSUAL VAGINAL DISCHARGE OR ITCHING   Items with * indicate a potential emergency and should be followed up as soon as possible or go to the Emergency Department if  any problems should occur.  Please show the CHEMOTHERAPY ALERT CARD or IMMUNOTHERAPY ALERT CARD at check-in to the Emergency Department and triage nurse.  Should you have questions after your visit or need to cancel or reschedule your appointment, please contact [facility]CER CTR  - A DEPT OF Eligha Bridegroom [facility] [phone]  and follow the prompts.  Office hours are 8:00 a.m. to 4:30 p.m. Monday - Friday. Please note that voicemails left after 4:00 p.m. may not be returned until the following business day.  We are closed weekends and major holidays. You have access to a nurse at all times for urgent questions. Please call the main number to the clinic  [phone] and follow the prompts.  For any non-urgent questions, you may also contact your provider using MyChart. We now offer e-Visits for anyone 5 and older to request care online for non-urgent symptoms. For details visit [email or web address].   Also download the MyChart app! Go to the app store, search "MyChart", open the app, select [place], and log in with your MyChart username and password.

## 2023-11-23 NOTE — Progress Notes (Signed)
Stephanie Middleton presents today for injection per the provider's orders.  Epogen administration without incident; injection site WNL; see MAR for injection details.  Patient tolerated procedure well and without incident.  No questions or complaints noted at this time.   Eogen given today per MD orders. Tolerated infusion without adverse affects. Vital signs stable. No complaints at this time. Discharged from clinic by wheel chair  in stable condition. Alert and oriented x 3. F/U with [facility] as scheduled.

## 2023-11-24 ENCOUNTER — Inpatient Hospital Stay: Payer: Medicare PPO

## 2023-12-01 DIAGNOSIS — H25813 Combined forms of age-related cataract, bilateral: Secondary | ICD-10-CM | POA: Diagnosis not present

## 2023-12-01 DIAGNOSIS — E119 Type 2 diabetes mellitus without complications: Secondary | ICD-10-CM | POA: Diagnosis not present

## 2023-12-08 ENCOUNTER — Inpatient Hospital Stay: Payer: Medicare PPO

## 2023-12-09 ENCOUNTER — Inpatient Hospital Stay: Attending: Hematology

## 2023-12-09 ENCOUNTER — Inpatient Hospital Stay

## 2023-12-09 VITALS — BP 153/63 | HR 64 | Temp 96.6°F | Resp 20

## 2023-12-09 DIAGNOSIS — D631 Anemia in chronic kidney disease: Secondary | ICD-10-CM | POA: Insufficient documentation

## 2023-12-09 DIAGNOSIS — N185 Chronic kidney disease, stage 5: Secondary | ICD-10-CM | POA: Diagnosis not present

## 2023-12-09 DIAGNOSIS — I120 Hypertensive chronic kidney disease with stage 5 chronic kidney disease or end stage renal disease: Secondary | ICD-10-CM | POA: Insufficient documentation

## 2023-12-09 DIAGNOSIS — Z79899 Other long term (current) drug therapy: Secondary | ICD-10-CM | POA: Insufficient documentation

## 2023-12-09 LAB — CBC
HCT: 30.1 % — ABNORMAL LOW (ref 36.0–46.0)
Hemoglobin: 9 g/dL — ABNORMAL LOW (ref 12.0–15.0)
MCH: 30.4 pg (ref 26.0–34.0)
MCHC: 29.9 g/dL — ABNORMAL LOW (ref 30.0–36.0)
MCV: 101.7 fL — ABNORMAL HIGH (ref 80.0–100.0)
Platelets: 186 10*3/uL (ref 150–400)
RBC: 2.96 MIL/uL — ABNORMAL LOW (ref 3.87–5.11)
RDW: 15.5 % (ref 11.5–15.5)
WBC: 4.9 10*3/uL (ref 4.0–10.5)
nRBC: 0 % (ref 0.0–0.2)

## 2023-12-09 MED ORDER — EPOETIN ALFA 10000 UNIT/ML IJ SOLN
10000.0000 [IU] | Freq: Once | INTRAMUSCULAR | Status: AC
  Administered 2023-12-09: 10000 [IU] via SUBCUTANEOUS
  Filled 2023-12-09: qty 1

## 2023-12-09 MED ORDER — CYANOCOBALAMIN 1000 MCG/ML IJ SOLN
1000.0000 ug | Freq: Once | INTRAMUSCULAR | Status: AC
  Administered 2023-12-09: 1000 ug via INTRAMUSCULAR
  Filled 2023-12-09: qty 1

## 2023-12-09 NOTE — Progress Notes (Signed)
 Patient okay to get retacrit and B12 today per pharmacy. Patient presents today for Retacrit injection. Hemoglobin reviewed prior to administration. VSS tolerated without incident or complaint. See MAR for details. Patient stable during and after injection.  Patient tolerated injection with no complaints voiced. Site clean and dry with no bruising or swelling noted at site. See MAR for details. Band aid applied.  Patient stable during and after injection. VSS with discharge and left in satisfactory condition with no s/s of distress noted.

## 2023-12-09 NOTE — Patient Instructions (Signed)
 [facility] [place] - A DEPT OF [facility]  Discharge Instructions: Thank you for choosing [facility] to provide your oncology and hematology care.  If you have a lab appointment with the Cancer Center - please note that after April 8th, 2024, all labs will be drawn in the cancer center.  You do not have to check in or register with the main entrance as you have in the past but will complete your check-in in the cancer center.  Wear comfortable clothing and clothing appropriate for easy access to any Portacath or PICC line.   We strive to give you quality time with your provider. You may need to reschedule your appointment if you arrive late (15 or more minutes).  Arriving late affects you and other patients whose appointments are after yours.  Also, if you miss three or more appointments without notifying the office, you may be dismissed from the clinic at the provider's discretion.      For prescription refill requests, have your pharmacy contact our office and allow 72 hours for refills to be completed.    Today you received the following Retacrit and B12 injection, return as scheduled.   To help prevent nausea and vomiting after your treatment, we encourage you to take your nausea medication as directed.  BELOW ARE SYMPTOMS THAT SHOULD BE REPORTED IMMEDIATELY: *FEVER GREATER THAN 100.4 F (38 C) OR HIGHER *CHILLS OR SWEATING *NAUSEA AND VOMITING THAT IS NOT CONTROLLED WITH YOUR NAUSEA MEDICATION *UNUSUAL SHORTNESS OF BREATH *UNUSUAL BRUISING OR BLEEDING *URINARY PROBLEMS (pain or burning when urinating, or frequent urination) *BOWEL PROBLEMS (unusual diarrhea, constipation, pain near the anus) TENDERNESS IN MOUTH AND THROAT WITH OR WITHOUT PRESENCE OF ULCERS (sore throat, sores in mouth, or a toothache) UNUSUAL RASH, SWELLING OR PAIN  UNUSUAL VAGINAL DISCHARGE OR ITCHING   Items with * indicate a potential emergency and should be followed up as  soon as possible or go to the Emergency Department if any problems should occur.  Please show the CHEMOTHERAPY ALERT CARD or IMMUNOTHERAPY ALERT CARD at check-in to the Emergency Department and triage nurse.  Should you have questions after your visit or need to cancel or reschedule your appointment, please contact [facility] [place] - A DEPT OF [facility] [phone]  and follow the prompts.  Office hours are 8:00 a.m. to 4:30 p.m. Monday - Friday. Please note that voicemails left after 4:00 p.m. may not be returned until the following business day.  We are closed weekends and major holidays. You have access to a nurse at all times for urgent questions. Please call the main number to the clinic [phone] and follow the prompts.  For any non-urgent questions, you may also contact your provider using MyChart. We now offer e-Visits for anyone 63 and older to request care online for non-urgent symptoms. For details visit [email or web address].   Also download the MyChart app! Go to the app store, search "MyChart", open the app, select , and log in with your MyChart username and password.

## 2023-12-10 ENCOUNTER — Inpatient Hospital Stay: Payer: Medicare PPO

## 2023-12-17 ENCOUNTER — Ambulatory Visit: Payer: Medicare PPO | Admitting: Podiatry

## 2023-12-17 ENCOUNTER — Encounter: Payer: Self-pay | Admitting: Podiatry

## 2023-12-17 DIAGNOSIS — M2041 Other hammer toe(s) (acquired), right foot: Secondary | ICD-10-CM | POA: Diagnosis not present

## 2023-12-17 DIAGNOSIS — M2042 Other hammer toe(s) (acquired), left foot: Secondary | ICD-10-CM | POA: Diagnosis not present

## 2023-12-17 DIAGNOSIS — M79675 Pain in left toe(s): Secondary | ICD-10-CM | POA: Diagnosis not present

## 2023-12-17 DIAGNOSIS — E1121 Type 2 diabetes mellitus with diabetic nephropathy: Secondary | ICD-10-CM

## 2023-12-17 DIAGNOSIS — B351 Tinea unguium: Secondary | ICD-10-CM | POA: Diagnosis not present

## 2023-12-17 DIAGNOSIS — M79674 Pain in right toe(s): Secondary | ICD-10-CM | POA: Diagnosis not present

## 2023-12-17 DIAGNOSIS — M2011 Hallux valgus (acquired), right foot: Secondary | ICD-10-CM | POA: Diagnosis not present

## 2023-12-17 DIAGNOSIS — M2012 Hallux valgus (acquired), left foot: Secondary | ICD-10-CM | POA: Diagnosis not present

## 2023-12-17 DIAGNOSIS — Z794 Long term (current) use of insulin: Secondary | ICD-10-CM

## 2023-12-17 DIAGNOSIS — E119 Type 2 diabetes mellitus without complications: Secondary | ICD-10-CM

## 2023-12-20 DIAGNOSIS — E1122 Type 2 diabetes mellitus with diabetic chronic kidney disease: Secondary | ICD-10-CM | POA: Diagnosis not present

## 2023-12-21 ENCOUNTER — Other Ambulatory Visit: Payer: Self-pay | Admitting: *Deleted

## 2023-12-21 DIAGNOSIS — D631 Anemia in chronic kidney disease: Secondary | ICD-10-CM

## 2023-12-21 DIAGNOSIS — N185 Chronic kidney disease, stage 5: Secondary | ICD-10-CM

## 2023-12-22 ENCOUNTER — Inpatient Hospital Stay: Payer: Medicare PPO

## 2023-12-22 VITALS — BP 129/66 | HR 63 | Temp 97.0°F | Resp 19

## 2023-12-22 DIAGNOSIS — D631 Anemia in chronic kidney disease: Secondary | ICD-10-CM

## 2023-12-22 DIAGNOSIS — I120 Hypertensive chronic kidney disease with stage 5 chronic kidney disease or end stage renal disease: Secondary | ICD-10-CM | POA: Diagnosis not present

## 2023-12-22 DIAGNOSIS — Z79899 Other long term (current) drug therapy: Secondary | ICD-10-CM | POA: Diagnosis not present

## 2023-12-22 DIAGNOSIS — N185 Chronic kidney disease, stage 5: Secondary | ICD-10-CM

## 2023-12-22 LAB — RENAL FUNCTION PANEL
Albumin: 4.1 g/dL (ref 3.5–5.0)
Anion gap: 14 (ref 5–15)
BUN: 57 mg/dL — ABNORMAL HIGH (ref 8–23)
CO2: 24 mmol/L (ref 22–32)
Calcium: 9.8 mg/dL (ref 8.9–10.3)
Chloride: 102 mmol/L (ref 98–111)
Creatinine, Ser: 3.09 mg/dL — ABNORMAL HIGH (ref 0.44–1.00)
GFR, Estimated: 14 mL/min — ABNORMAL LOW (ref >60)
Glucose, Bld: 74 mg/dL (ref 70–99)
Phosphorus: 4.4 mg/dL (ref 2.5–4.6)
Potassium: 3.9 mmol/L (ref 3.5–5.1)
Sodium: 140 mmol/L (ref 135–145)

## 2023-12-22 LAB — PROTEIN / CREATININE RATIO, URINE
Creatinine, Urine: 44 mg/dL
Protein Creatinine Ratio: 0.55 mg/mg{creat} — ABNORMAL HIGH (ref 0.00–0.15)
Total Protein, Urine: 24 mg/dL

## 2023-12-22 LAB — CBC
HCT: 31.1 % — ABNORMAL LOW (ref 36.0–46.0)
Hemoglobin: 9.5 g/dL — ABNORMAL LOW (ref 12.0–15.0)
MCH: 30.9 pg (ref 26.0–34.0)
MCHC: 30.5 g/dL (ref 30.0–36.0)
MCV: 101.3 fL — ABNORMAL HIGH (ref 80.0–100.0)
Platelets: 211 10*3/uL (ref 150–400)
RBC: 3.07 MIL/uL — ABNORMAL LOW (ref 3.87–5.11)
RDW: 15.5 % (ref 11.5–15.5)
WBC: 5.1 10*3/uL (ref 4.0–10.5)
nRBC: 0 % (ref 0.0–0.2)

## 2023-12-22 MED ORDER — EPOETIN ALFA 10000 UNIT/ML IJ SOLN
10000.0000 [IU] | Freq: Once | INTRAMUSCULAR | Status: AC
  Administered 2023-12-22: 10000 [IU] via SUBCUTANEOUS
  Filled 2023-12-22: qty 1

## 2023-12-22 NOTE — Progress Notes (Signed)
 Patient's Hgb 9.5 and blood pressure stable. Patient  tolerated injection with no complaints voiced.  Site clean and dry with no bruising or swelling noted at site.  See MAR for details.  Band aid applied.  Patient stable during and after injection.  Vss with discharge and left in satisfactory condition with no s/s of distress noted. All follow ups as scheduled.   Lucyann Romano [employer]

## 2023-12-24 DIAGNOSIS — D631 Anemia in chronic kidney disease: Secondary | ICD-10-CM | POA: Diagnosis not present

## 2023-12-24 DIAGNOSIS — E1129 Type 2 diabetes mellitus with other diabetic kidney complication: Secondary | ICD-10-CM | POA: Diagnosis not present

## 2023-12-24 DIAGNOSIS — N2581 Secondary hyperparathyroidism of renal origin: Secondary | ICD-10-CM | POA: Diagnosis not present

## 2023-12-24 DIAGNOSIS — N185 Chronic kidney disease, stage 5: Secondary | ICD-10-CM | POA: Diagnosis not present

## 2023-12-24 LAB — PTH, INTACT AND CALCIUM
Calcium, Total (PTH): 9.6 mg/dL (ref 8.7–10.3)
PTH: 157 pg/mL — ABNORMAL HIGH (ref 15–65)

## 2023-12-26 ENCOUNTER — Encounter: Payer: Self-pay | Admitting: Podiatry

## 2023-12-26 NOTE — Progress Notes (Signed)
 ANNUAL DIABETIC FOOT EXAM  Subjective: Stephanie Middleton presents today for annual diabetic foot exam.  Chief Complaint  Patient presents with   Diabetes    "Check my feet and trim my toenails."  Dr. [name] - 10/26/2023,  A1c -5.9   Patient confirms h/o diabetes.  Patient denies any h/o foot wounds.  [name], MD is patient's PCP.  Past Medical History:  Diagnosis Date   Anemia    Chronic kidney disease    Chronic pain    Back, right hip and right knee   Diabetes mellitus without complication (HCC)    Diplopia    Hypercholesteremia    Hypertension    Patient Active Problem List   Diagnosis Date Noted   Anemia in chronic kidney disease 07/25/2010   Chronic kidney disease 07/25/2010   Essential (primary) hypertension 07/25/2010   Low back pain 07/25/2010   Pure hypercholesterolemia 07/25/2010   Type 2 diabetes mellitus with diabetic nephropathy (HCC) 07/25/2010   Type 2 diabetes mellitus with hyperglycemia (HCC) 07/25/2010   Past Surgical History:  Procedure Laterality Date   CHOLECYSTECTOMY     CYST EXCISION     Current Outpatient Medications on File Prior to Visit  Medication Sig Dispense Refill   ACCU-CHEK GUIDE test strip      Accu-Chek Softclix Lancets lancets      acetaminophen (TYLENOL) 500 MG tablet Take 500 mg by mouth 2 (two) times a day.     allopurinol (ZYLOPRIM) 100 MG tablet Take 100 mg by mouth daily.     amLODipine (NORVASC) 5 MG tablet Take 5 mg by mouth daily.     aspirin EC 81 MG tablet Take 81 mg by mouth daily.     B-D ULTRAFINE III SHORT PEN 31G X 8 MM MISC Inject into the skin as directed.     calcitRIOL (ROCALTROL) 0.25 MCG capsule Take 0.25 mcg by mouth daily.     Cholecalciferol 125 MCG (5000 UT) capsule Take 5,000 Units by mouth daily.     epoetin alfa (EPOGEN) 4000 UNIT/ML injection Inject 8,000 Units into the skin every 14 (fourteen) days.      furosemide (LASIX) 20 MG tablet Take 20 mg by mouth.     glipiZIDE (GLUCOTROL) 5  MG tablet Take by mouth 2 (two) times daily before a meal. Take 2 tablets twice daily     iron polysaccharides (NIFEREX) 150 MG capsule Take 150 mg by mouth 2 (two) times daily.     labetalol (NORMODYNE) 200 MG tablet Take 200 mg by mouth 2 (two) times daily. Take 2 tablets twice daily     linagliptin (TRADJENTA) 5 MG TABS tablet Take 5 mg by mouth daily.     pravastatin (PRAVACHOL) 40 MG tablet Take 40 mg by mouth at bedtime.     RABEprazole (ACIPHEX) 20 MG tablet Take 20 mg by mouth daily as needed. 1 tablet by mouth everyday as needed for indigestion     TRESIBA FLEXTOUCH 100 UNIT/ML FlexTouch Pen SMARTSIG:26 Unit(s) SUB-Q Daily     No current facility-administered medications on file prior to visit.    No Known Allergies Social History   Occupational History   Not on file  Tobacco Use   Smoking status: Never   Smokeless tobacco: Never  Vaping Use   Vaping status: Never Used  Substance and Sexual Activity   Alcohol use: Never   Drug use: Never   Sexual activity: Not Currently    Birth control/protection: None  Family History  Problem Relation Age of Onset   Leukemia Mother    Heart disease Father    Immunization History  Administered Date(s) Administered   Influenza-Unspecified 07/05/2014     Review of Systems: Negative except as noted in the HPI.   Objective: There were no vitals filed for this visit.  Stephanie Middleton is a pleasant 89 y.o. female in NAD. AAO X 3.  Diabetic foot exam was performed with the following findings:   Vascular Examination: Capillary refill time immediate b/l. Vascular status intact b/l with palpable pedal pulses. Pedal hair present b/l. No pain with calf compression b/l. Skin temperature gradient WNL b/l. No cyanosis or clubbing b/l. No ischemia or gangrene noted b/l. Trace edema noted BLE.  Neurological Examination: Protective sensation intact 5/5 intact bilaterally with 10g monofilament b/l. Vibratory sensation intact right lower  extremity. Vibratory sensation diminished left lower extremity.  Dermatological Examination: Pedal skin with normal turgor, texture and tone b/l.  No open wounds. No interdigital macerations.   Toenails 1-5 b/l thick, discolored, elongated with subungual debris and pain on dorsal palpation.   No corns, calluses nor porokeratotic lesions noted.  Musculoskeletal Examination: Muscle strength 5/5 to all lower extremity muscle groups bilaterally. HAV with bunion deformity noted b/l LE. Hammertoe deformity noted 2-5 b/l.  Radiographs: None     Lab Results  Component Value Date   HGBA1C 5.9 (H) 09/30/2023   ADA Risk Categorization: Low Risk :  Patient has all of the following: Intact protective sensation No prior foot ulcer  No severe deformity Pedal pulses present  Assessment: 1. Pain due to onychomycosis of toenails of both feet   2. Hallux valgus, acquired, bilateral   3. Hammertoes of both feet   4. Type 2 diabetes mellitus with diabetic nephropathy, with long-term current use of insulin (HCC)   5. Encounter for diabetic foot exam (HCC)     Plan: Diabetic foot examination performed today. All patient's and/or POA's questions/concerns addressed on today's visit. Toenails 1-5 debrided in length and girth without incident. Continue foot and shoe inspections daily. Monitor blood glucose per PCP/Endocrinologist's recommendations. Continue soft, supportive shoe gear daily. Report any pedal injuries to medical professional. Call office if there are any questions/concerns. -Patient/POA to call should there be question/concern in the interim. Return in about 9 weeks (around 02/18/2024).  Stephanie Middleton, DPM      [place] LOCATION: [address], [place] [address]                   Office [phone]   [facility] LOCATION: [address] [place], [place] [address] Office [phone]

## 2024-01-05 ENCOUNTER — Inpatient Hospital Stay: Payer: Medicare PPO | Attending: Hematology

## 2024-01-05 ENCOUNTER — Inpatient Hospital Stay: Payer: Medicare PPO

## 2024-01-05 VITALS — BP 170/64 | HR 65 | Temp 98.0°F | Resp 17

## 2024-01-05 DIAGNOSIS — N185 Chronic kidney disease, stage 5: Secondary | ICD-10-CM | POA: Diagnosis not present

## 2024-01-05 DIAGNOSIS — I120 Hypertensive chronic kidney disease with stage 5 chronic kidney disease or end stage renal disease: Secondary | ICD-10-CM | POA: Insufficient documentation

## 2024-01-05 DIAGNOSIS — E538 Deficiency of other specified B group vitamins: Secondary | ICD-10-CM | POA: Insufficient documentation

## 2024-01-05 DIAGNOSIS — D631 Anemia in chronic kidney disease: Secondary | ICD-10-CM

## 2024-01-05 DIAGNOSIS — Z79899 Other long term (current) drug therapy: Secondary | ICD-10-CM | POA: Insufficient documentation

## 2024-01-05 LAB — CBC
HCT: 29.8 % — ABNORMAL LOW (ref 36.0–46.0)
Hemoglobin: 9 g/dL — ABNORMAL LOW (ref 12.0–15.0)
MCH: 30.7 pg (ref 26.0–34.0)
MCHC: 30.2 g/dL (ref 30.0–36.0)
MCV: 101.7 fL — ABNORMAL HIGH (ref 80.0–100.0)
Platelets: 185 10*3/uL (ref 150–400)
RBC: 2.93 MIL/uL — ABNORMAL LOW (ref 3.87–5.11)
RDW: 15.5 % (ref 11.5–15.5)
WBC: 4.8 10*3/uL (ref 4.0–10.5)
nRBC: 0 % (ref 0.0–0.2)

## 2024-01-05 MED ORDER — EPOETIN ALFA 10000 UNIT/ML IJ SOLN
10000.0000 [IU] | Freq: Once | INTRAMUSCULAR | Status: AC
  Administered 2024-01-05: 10000 [IU] via SUBCUTANEOUS
  Filled 2024-01-05: qty 1

## 2024-01-05 MED ORDER — CYANOCOBALAMIN 1000 MCG/ML IJ SOLN
1000.0000 ug | Freq: Once | INTRAMUSCULAR | Status: AC
  Administered 2024-01-05: 1000 ug via INTRAMUSCULAR
  Filled 2024-01-05: qty 1

## 2024-01-05 NOTE — Progress Notes (Signed)
 170/64 patient's blood pressure today. Asymptomatic.HGB 9.0.  Patient states her kidney doctor is aware and has her monitoring her blood pressure at this time . Last appointment no adjustments made to her blood pressure medication.   Stephanie Middleton presents today for injection per the provider's orders.  B12 and Prolia administration without incident; injection site WNL; see MAR for injection details.  Patient tolerated procedure well and without incident.  No questions or complaints noted at this time. Discharged from clinic by wheel chair in stable condition. Alert and oriented x 3. F/U with [facility] as scheduled.

## 2024-01-05 NOTE — Patient Instructions (Signed)
 [facility] [place] - A DEPT OF [facility]  Discharge Instructions: Thank you for choosing [facility] to provide your oncology and hematology care.  If you have a lab appointment with the Cancer Center - please note that after April 8th, 2024, all labs will be drawn in the cancer center.  You do not have to check in or register with the main entrance as you have in the past but will complete your check-in in the cancer center.  Wear comfortable clothing and clothing appropriate for easy access to any Portacath or PICC line.   We strive to give you quality time with your provider. You may need to reschedule your appointment if you arrive late (15 or more minutes).  Arriving late affects you and other patients whose appointments are after yours.  Also, if you miss three or more appointments without notifying the office, you may be dismissed from the clinic at the provider's discretion.      For prescription refill requests, have your pharmacy contact our office and allow 72 hours for refills to be completed.    Today you received the following chemotherapy and/or immunotherapy agents Prolia and B12 injection. Vitamin B12 Injection What is this medication? Vitamin B12 (VAHY tuh min B12) prevents and treats low vitamin B12 levels in your body. It is used in people who do not get enough vitamin B12 from their diet or when their digestive tract does not absorb enough. Vitamin B12 plays an important role in maintaining the health of your nervous system and red blood cells. This medicine may be used for other purposes; ask your health care provider or pharmacist if you have questions. COMMON BRAND NAME(S): B-12 Compliance Kit, B-12 Injection Kit, Cyomin, Dodex, LA-12, Nutri-Twelve, Physicians EZ Use B-12, Primabalt, Vitamin Deficiency Injectable System - B12 What should I tell my care team before I take this medication? They need to know if you have any of these  conditions: Kidney disease Leber's disease Megaloblastic anemia An unusual or allergic reaction to cyanocobalamin, cobalt, other medications, foods, dyes, or preservatives Pregnant or trying to get pregnant Breast-feeding How should I use this medication? This medication is injected into a muscle or deeply under the skin. It is usually given in a clinic or care team's office. However, your care team may teach you how to inject yourself. Follow all instructions. Talk to your care team about the use of this medication in children. Special care may be needed. Overdosage: If you think you have taken too much of this medicine contact a poison control center or emergency room at once. NOTE: This medicine is only for you. Do not share this medicine with others. What if I miss a dose? If you are given your dose at a clinic or care team's office, call to reschedule your appointment. If you give your own injections, and you miss a dose, take it as soon as you can. If it is almost time for your next dose, take only that dose. Do not take double or extra doses. What may interact with this medication? Alcohol Colchicine This list may not describe all possible interactions. Give your health care provider a list of all the medicines, herbs, non-prescription drugs, or dietary supplements you use. Also tell them if you smoke, drink alcohol, or use illegal drugs. Some items may interact with your medicine. What should I watch for while using this medication? Visit your care team regularly. You may need blood work  done while you are taking this medication. You may need to follow a special diet. Talk to your care team. Limit your alcohol intake and avoid smoking to get the best benefit. What side effects may I notice from receiving this medication? Side effects that you should report to your care team as soon as possible: Allergic reactions--skin rash, itching, hives, swelling of the face, lips, tongue, or  throat Swelling of the ankles, hands, or feet Trouble breathing Side effects that usually do not require medical attention (report to your care team if they continue or are bothersome): Diarrhea This list may not describe all possible side effects. Call your doctor for medical advice about side effects. You may report side effects to FDA at 1-800-FDA-1088. Where should I keep my medication? Keep out of the reach of children. Store at room temperature between 15 and 30 degrees C (59 and 85 degrees F). Protect from light. Throw away any unused medication after the expiration date. NOTE: This sheet is a summary. It may not cover all possible information. If you have questions about this medicine, talk to your doctor, pharmacist, or health care provider.  2024 Elsevier/Gold Standard (2021-06-03 00:00:00)Denosumab Injection (Osteoporosis) What is this medication? DENOSUMAB (den oh SUE mab) prevents and treats osteoporosis. It works by Interior and spatial designer stronger and less likely to break (fracture). It is a monoclonal antibody. This medicine may be used for other purposes; ask your health care provider or pharmacist if you have questions. COMMON BRAND NAME(S): Prolia What should I tell my care team before I take this medication? They need to know if you have any of these conditions: Dental or gum disease Had thyroid or parathyroid (glands located in neck) surgery Having dental surgery or a tooth pulled Kidney disease Low levels of calcium in the blood On dialysis Poor nutrition Thyroid disease Trouble absorbing nutrients from your food An unusual or allergic reaction to denosumab, other medications, foods, dyes, or preservatives Pregnant or trying to get pregnant Breastfeeding How should I use this medication? This medication is injected under the skin. It is given by your care team in a hospital or clinic setting. A special MedGuide will be given to you before each treatment. Be sure to  read this information carefully each time. Talk to your care team about the use of this medication in children. Special care may be needed. Overdosage: If you think you have taken too much of this medicine contact a poison control center or emergency room at once. NOTE: This medicine is only for you. Do not share this medicine with others. What if I miss a dose? Keep appointments for follow-up doses. It is important not to miss your dose. Call your care team if you are unable to keep an appointment. What may interact with this medication? Do not take this medication with any of the following: Other medications that contain denosumab This medication may also interact with the following: Medications that lower your chance of fighting infection Steroid medications, such as prednisone or cortisone This list may not describe all possible interactions. Give your health care provider a list of all the medicines, herbs, non-prescription drugs, or dietary supplements you use. Also tell them if you smoke, drink alcohol, or use illegal drugs. Some items may interact with your medicine. What should I watch for while using this medication? Your condition will be monitored carefully while you are receiving this medication. You may need blood work done while taking this medication. This medication may increase your  risk of getting an infection. Call your care team for advice if you get a fever, chills, sore throat, or other symptoms of a cold or flu. Do not treat yourself. Try to avoid being around people who are sick. Tell your dentist and dental surgeon that you are taking this medication. You should not have major dental surgery while on this medication. See your dentist to have a dental exam and fix any dental problems before starting this medication. Take good care of your teeth while on this medication. Make sure you see your dentist for regular follow-up appointments. This medication may cause low levels  of calcium in your body. The risk of severe side effects is increased in people with kidney disease. Your care team may prescribe calcium and vitamin D to help prevent low calcium levels while you take this medication. It is important to take calcium and vitamin D as directed by your care team. Talk to your care team if you may be pregnant. Serious birth defects may occur if you take this medication during pregnancy and for 5 months after the last dose. You will need a negative pregnancy test before starting this medication. Contraception is recommended while taking this medication and for 5 months after the last dose. Your care team can help you find the option that works for you. Talk to your care team before breastfeeding. Changes to your treatment plan may be needed. What side effects may I notice from receiving this medication? Side effects that you should report to your care team as soon as possible: Allergic reactions--skin rash, itching, hives, swelling of the face, lips, tongue, or throat Infection--fever, chills, cough, sore throat, wounds that don't heal, pain or trouble when passing urine, general feeling of discomfort or being unwell Low calcium level--muscle pain or cramps, confusion, tingling, or numbness in the hands or feet Osteonecrosis of the jaw--pain, swelling, or redness in the mouth, numbness of the jaw, poor healing after dental work, unusual discharge from the mouth, visible bones in the mouth Severe bone, joint, or muscle pain Skin infection--skin redness, swelling, warmth, or pain Side effects that usually do not require medical attention (report these to your care team if they continue or are bothersome): Back pain Headache Joint pain Muscle pain Pain in the hands, arms, legs, or feet Runny or stuffy nose Sore throat This list may not describe all possible side effects. Call your doctor for medical advice about side effects. You may report side effects to FDA at  1-800-FDA-1088. Where should I keep my medication? This medication is given in a hospital or clinic. It will not be stored at home. NOTE: This sheet is a summary. It may not cover all possible information. If you have questions about this medicine, talk to your doctor, pharmacist, or health care provider.  2024 Elsevier/Gold Standard (2022-10-27 00:00:00)      To help prevent nausea and vomiting after your treatment, we encourage you to take your nausea medication as directed.  BELOW ARE SYMPTOMS THAT SHOULD BE REPORTED IMMEDIATELY: *FEVER GREATER THAN 100.4 F (38 C) OR HIGHER *CHILLS OR SWEATING *NAUSEA AND VOMITING THAT IS NOT CONTROLLED WITH YOUR NAUSEA MEDICATION *UNUSUAL SHORTNESS OF BREATH *UNUSUAL BRUISING OR BLEEDING *URINARY PROBLEMS (pain or burning when urinating, or frequent urination) *BOWEL PROBLEMS (unusual diarrhea, constipation, pain near the anus) TENDERNESS IN MOUTH AND THROAT WITH OR WITHOUT PRESENCE OF ULCERS (sore throat, sores in mouth, or a toothache) UNUSUAL RASH, SWELLING OR PAIN  UNUSUAL VAGINAL DISCHARGE OR ITCHING  Items with * indicate a potential emergency and should be followed up as soon as possible or go to the Emergency Department if any problems should occur.  Please show the CHEMOTHERAPY ALERT CARD or IMMUNOTHERAPY ALERT CARD at check-in to the Emergency Department and triage nurse.  Should you have questions after your visit or need to cancel or reschedule your appointment, please contact [facility] CANCER CTR [place] - A DEPT OF [facility] [phone]  and follow the prompts.  Office hours are 8:00 a.m. to 4:30 p.m. Monday - Friday. Please note that voicemails left after 4:00 p.m. may not be returned until the following business day.  We are closed weekends and major holidays. You have access to a nurse at all times for urgent questions. Please call the main number to the clinic [phone] and follow the prompts.  For any  non-urgent questions, you may also contact your provider using MyChart. We now offer e-Visits for anyone 20 and older to request care online for non-urgent symptoms. For details visit [email or web address].   Also download the MyChart app! Go to the app store, search "MyChart", open the app, select [place], and log in with your MyChart username and password.

## 2024-01-10 ENCOUNTER — Inpatient Hospital Stay: Payer: Medicare PPO

## 2024-01-19 ENCOUNTER — Inpatient Hospital Stay: Payer: Medicare PPO

## 2024-01-19 VITALS — BP 149/68 | HR 61 | Resp 18

## 2024-01-19 DIAGNOSIS — D631 Anemia in chronic kidney disease: Secondary | ICD-10-CM

## 2024-01-19 DIAGNOSIS — I120 Hypertensive chronic kidney disease with stage 5 chronic kidney disease or end stage renal disease: Secondary | ICD-10-CM | POA: Diagnosis not present

## 2024-01-19 DIAGNOSIS — N185 Chronic kidney disease, stage 5: Secondary | ICD-10-CM | POA: Diagnosis not present

## 2024-01-19 DIAGNOSIS — Z79899 Other long term (current) drug therapy: Secondary | ICD-10-CM | POA: Diagnosis not present

## 2024-01-19 DIAGNOSIS — E538 Deficiency of other specified B group vitamins: Secondary | ICD-10-CM | POA: Diagnosis not present

## 2024-01-19 LAB — CBC
HCT: 30.7 % — ABNORMAL LOW (ref 36.0–46.0)
Hemoglobin: 9.4 g/dL — ABNORMAL LOW (ref 12.0–15.0)
MCH: 31 pg (ref 26.0–34.0)
MCHC: 30.6 g/dL (ref 30.0–36.0)
MCV: 101.3 fL — ABNORMAL HIGH (ref 80.0–100.0)
Platelets: 205 10*3/uL (ref 150–400)
RBC: 3.03 MIL/uL — ABNORMAL LOW (ref 3.87–5.11)
RDW: 15.8 % — ABNORMAL HIGH (ref 11.5–15.5)
WBC: 4.8 10*3/uL (ref 4.0–10.5)
nRBC: 0 % (ref 0.0–0.2)

## 2024-01-19 MED ORDER — EPOETIN ALFA 10000 UNIT/ML IJ SOLN
10000.0000 [IU] | Freq: Once | INTRAMUSCULAR | Status: AC
  Administered 2024-01-19: 10000 [IU] via SUBCUTANEOUS
  Filled 2024-01-19: qty 1

## 2024-01-19 NOTE — Progress Notes (Signed)
Patient presents today for Retacrit injection. Hemoglobin reviewed prior to administration. VSS tolerated without incident or complaint. See MAR for details. Patient stable during and after injection. Patient discharged in satisfactory condition with no s/s of distress noted.  

## 2024-01-19 NOTE — Patient Instructions (Signed)

## 2024-01-24 DIAGNOSIS — D510 Vitamin B12 deficiency anemia due to intrinsic factor deficiency: Secondary | ICD-10-CM | POA: Diagnosis not present

## 2024-01-24 DIAGNOSIS — M150 Primary generalized (osteo)arthritis: Secondary | ICD-10-CM | POA: Diagnosis not present

## 2024-01-24 DIAGNOSIS — I129 Hypertensive chronic kidney disease with stage 1 through stage 4 chronic kidney disease, or unspecified chronic kidney disease: Secondary | ICD-10-CM | POA: Diagnosis not present

## 2024-01-24 DIAGNOSIS — Z794 Long term (current) use of insulin: Secondary | ICD-10-CM | POA: Diagnosis not present

## 2024-01-24 DIAGNOSIS — D631 Anemia in chronic kidney disease: Secondary | ICD-10-CM | POA: Diagnosis not present

## 2024-01-24 DIAGNOSIS — N184 Chronic kidney disease, stage 4 (severe): Secondary | ICD-10-CM | POA: Diagnosis not present

## 2024-01-24 DIAGNOSIS — E1122 Type 2 diabetes mellitus with diabetic chronic kidney disease: Secondary | ICD-10-CM | POA: Diagnosis not present

## 2024-01-24 DIAGNOSIS — D508 Other iron deficiency anemias: Secondary | ICD-10-CM | POA: Diagnosis not present

## 2024-02-03 ENCOUNTER — Inpatient Hospital Stay: Payer: Medicare PPO

## 2024-02-03 ENCOUNTER — Inpatient Hospital Stay

## 2024-02-03 ENCOUNTER — Inpatient Hospital Stay: Attending: Hematology

## 2024-02-03 ENCOUNTER — Inpatient Hospital Stay: Admitting: Oncology

## 2024-02-03 DIAGNOSIS — Z7982 Long term (current) use of aspirin: Secondary | ICD-10-CM | POA: Insufficient documentation

## 2024-02-03 DIAGNOSIS — R768 Other specified abnormal immunological findings in serum: Secondary | ICD-10-CM | POA: Diagnosis not present

## 2024-02-03 DIAGNOSIS — E7800 Pure hypercholesterolemia, unspecified: Secondary | ICD-10-CM | POA: Diagnosis not present

## 2024-02-03 DIAGNOSIS — D631 Anemia in chronic kidney disease: Secondary | ICD-10-CM

## 2024-02-03 DIAGNOSIS — Z7984 Long term (current) use of oral hypoglycemic drugs: Secondary | ICD-10-CM | POA: Diagnosis not present

## 2024-02-03 DIAGNOSIS — D509 Iron deficiency anemia, unspecified: Secondary | ICD-10-CM | POA: Diagnosis not present

## 2024-02-03 DIAGNOSIS — K30 Functional dyspepsia: Secondary | ICD-10-CM | POA: Diagnosis not present

## 2024-02-03 DIAGNOSIS — I120 Hypertensive chronic kidney disease with stage 5 chronic kidney disease or end stage renal disease: Secondary | ICD-10-CM | POA: Diagnosis not present

## 2024-02-03 DIAGNOSIS — Z794 Long term (current) use of insulin: Secondary | ICD-10-CM | POA: Insufficient documentation

## 2024-02-03 DIAGNOSIS — N185 Chronic kidney disease, stage 5: Secondary | ICD-10-CM | POA: Diagnosis not present

## 2024-02-03 DIAGNOSIS — E1122 Type 2 diabetes mellitus with diabetic chronic kidney disease: Secondary | ICD-10-CM | POA: Insufficient documentation

## 2024-02-03 DIAGNOSIS — E538 Deficiency of other specified B group vitamins: Secondary | ICD-10-CM | POA: Diagnosis not present

## 2024-02-03 DIAGNOSIS — N189 Chronic kidney disease, unspecified: Secondary | ICD-10-CM

## 2024-02-03 DIAGNOSIS — Z79899 Other long term (current) drug therapy: Secondary | ICD-10-CM | POA: Insufficient documentation

## 2024-02-03 DIAGNOSIS — K5909 Other constipation: Secondary | ICD-10-CM | POA: Diagnosis not present

## 2024-02-03 DIAGNOSIS — R350 Frequency of micturition: Secondary | ICD-10-CM | POA: Insufficient documentation

## 2024-02-03 LAB — COMPREHENSIVE METABOLIC PANEL WITH GFR
ALT: 18 U/L (ref 0–44)
AST: 18 U/L (ref 15–41)
Albumin: 4.1 g/dL (ref 3.5–5.0)
Alkaline Phosphatase: 60 U/L (ref 38–126)
Anion gap: 13 (ref 5–15)
BUN: 67 mg/dL — ABNORMAL HIGH (ref 8–23)
CO2: 23 mmol/L (ref 22–32)
Calcium: 9.7 mg/dL (ref 8.9–10.3)
Chloride: 102 mmol/L (ref 98–111)
Creatinine, Ser: 3.31 mg/dL — ABNORMAL HIGH (ref 0.44–1.00)
GFR, Estimated: 13 mL/min — ABNORMAL LOW (ref >60)
Glucose, Bld: 142 mg/dL — ABNORMAL HIGH (ref 70–99)
Potassium: 4.3 mmol/L (ref 3.5–5.1)
Sodium: 138 mmol/L (ref 135–145)
Total Bilirubin: 0.8 mg/dL (ref 0.0–1.2)
Total Protein: 7 g/dL (ref 6.5–8.1)

## 2024-02-03 LAB — IRON AND TIBC
Iron: 69 ug/dL (ref 28–170)
Saturation Ratios: 26 % (ref 10.4–31.8)
TIBC: 263 ug/dL (ref 250–450)
UIBC: 194 ug/dL

## 2024-02-03 LAB — CBC WITH DIFFERENTIAL/PLATELET
Abs Immature Granulocytes: 0.03 10*3/uL (ref 0.00–0.07)
Basophils Absolute: 0 10*3/uL (ref 0.0–0.1)
Basophils Relative: 1 %
Eosinophils Absolute: 0.1 10*3/uL (ref 0.0–0.5)
Eosinophils Relative: 3 %
HCT: 30.4 % — ABNORMAL LOW (ref 36.0–46.0)
Hemoglobin: 9.1 g/dL — ABNORMAL LOW (ref 12.0–15.0)
Immature Granulocytes: 1 %
Lymphocytes Relative: 23 %
Lymphs Abs: 1.1 10*3/uL (ref 0.7–4.0)
MCH: 30 pg (ref 26.0–34.0)
MCHC: 29.9 g/dL — ABNORMAL LOW (ref 30.0–36.0)
MCV: 100.3 fL — ABNORMAL HIGH (ref 80.0–100.0)
Monocytes Absolute: 0.4 10*3/uL (ref 0.1–1.0)
Monocytes Relative: 8 %
Neutro Abs: 3.1 10*3/uL (ref 1.7–7.7)
Neutrophils Relative %: 64 %
Platelets: 190 10*3/uL (ref 150–400)
RBC: 3.03 MIL/uL — ABNORMAL LOW (ref 3.87–5.11)
RDW: 15.8 % — ABNORMAL HIGH (ref 11.5–15.5)
WBC: 4.8 10*3/uL (ref 4.0–10.5)
nRBC: 0 % (ref 0.0–0.2)

## 2024-02-03 LAB — VITAMIN B12: Vitamin B-12: 581 pg/mL (ref 180–914)

## 2024-02-03 LAB — FERRITIN: Ferritin: 876 ng/mL — ABNORMAL HIGH (ref 11–307)

## 2024-02-03 MED ORDER — CYANOCOBALAMIN 1000 MCG/ML IJ SOLN
1000.0000 ug | Freq: Once | INTRAMUSCULAR | Status: AC
Start: 2024-02-03 — End: 2024-02-03
  Administered 2024-02-03: 1000 ug via INTRAMUSCULAR
  Filled 2024-02-03: qty 1

## 2024-02-03 MED ORDER — EPOETIN ALFA 10000 UNIT/ML IJ SOLN
10000.0000 [IU] | Freq: Once | INTRAMUSCULAR | Status: AC
  Administered 2024-02-03: 10000 [IU] via SUBCUTANEOUS
  Filled 2024-02-03: qty 1

## 2024-02-03 NOTE — Progress Notes (Signed)
 [facility] [address] [place] [address]   Clinic Day:  02/03/2024  Referring physician: Wilburn Handler, MD  Patient Care Team: Wilburn Handler, MD as PCP - General (Family Medicine)   ASSESSMENT & PLAN:   Assessment:  1.  Normocytic anemia from CKD: - Patient seen at the request of Dr. Carrolyn Clan - She is receiving Retacrit  8000 units every 2 weeks since 06/05/2020, prior to that 6000 units every 2 weeks from 12/06/2019, prior to that 4000 units every 2 weeks since October 2017. - She has CKD stage V, CKD since 2009 secondary to diabetes.  She has nonnephrotic range proteinuria. - She has been on iron  tablet daily for many years.  Never had colonoscopy.  Denies any bleeding per rectum or melena.  2.  Social/family history: - Her daughter Stephanie Middleton lives with the patient.  Patient is independent of ADLs.  She was never smoker. - No family history of anemia.  Her mother had leukemia.  Plan:  1.  Normocytic anemia from CKD: - Hematology workup from 10/13/2023 showed hemoglobin of 8.9, MCV 102.0 with normal WBC and platelet counts.  Differential is normal.  Ferritin elevated at 598, folate normal at 21.9 with iron  saturations 15%.  TIBC 287.  Copper  normal and MMA significantly elevated at 720.  B12 237.  SPEP did not reveal M spike but shows elevated IgG, IgM and IgA levels.  Kappa lambda free light chains are elevated with normal kappa lambda light chain ratio.  Immature reticulocyte fraction elevated at 20.7 - She receives bimonthly 10,000 unit Retacrit  injections. -Hemoglobin over the past 2 months has been between 8.9-9.5.  -Labs from today show hemoglobin of 9.1.  -Would recommend continued Retacrit  20,000 units every other week.  We discussed increasing Retacrit  should hemoglobin decline despite adequate iron  and B12 levels.  We will just continue Retacrit  every other week given we are holding stable.  2.  Iron  deficiency anemia: - She received 2 doses of IV Venofer  given  on 10/21/2023 and 10/29/2023. -Labs from today show a ferritin of 876 saturation 26% with a normal TIBC.  Hemoglobin is 9.1 MCV 100.3. -She does not need any additional IV iron  at this time. -She is unable to tolerate oral iron  secondary to malabsorption and GI upset. -We will recheck labs in approximately 4 months.  3.  B12 deficiency: -She received weekly B12 injections x 4 and has now been receiving monthly B12 injections.  Last B12 injection was on 01/05/2024.  She is due for 1 today. -Repeat lab work from today shows vitamin B12 level of 581 (237).  Continue monthly B12 injections. -Recheck B12 level and MMA in 4 months.  PLAN SUMMARY: >> Retacrit  and b12 today. RTC every 2 weeks with lab and retacrit .  >> Continue monthly B12 injections. >> RTC in 4 months with labs same day and office visit.      Orders Placed This Encounter  Procedures   Ferritin    Standing Status:   Future    Expected Date:   05/05/2024    Expiration Date:   02/02/2025   Iron  and TIBC ([facility] ONLY)    Standing Status:   Future    Expected Date:   05/05/2024    Expiration Date:   02/02/2025   Vitamin B12    Standing Status:   Future    Expected Date:   05/05/2024    Expiration Date:   02/02/2025   CBC with Differential/Platelet  Standing Status:   Future    Expected Date:   05/05/2024    Expiration Date:   02/02/2025   Comprehensive metabolic panel    Standing Status:   Future    Expected Date:   05/05/2024    Expiration Date:   02/02/2025   Methylmalonic Acid    Standing Status:   Future    Expected Date:   05/05/2024    Expiration Date:   02/02/2025    Aurther Blue, NP   5/1/20251:28 PM  CHIEF COMPLAINT/PURPOSE OF CONSULT:   Diagnosis: Anemia from CKD.  Current Therapy: Retacrit  every 2 weeks  HISTORY OF PRESENT ILLNESS:   Stephanie Middleton is a 89 y.o. female presenting to clinic today for follow-up for anemia.  She was last seen by me on 10/13/2023.  In the interim, she received weekly B12  injections x 4 and monthly ever since.  Her last injection was on 01/05/2024.  She has also received 2 doses IV Venofer  (300 mg) given on 10/21/2023 and 10/29/2023.  She continues to receive bimonthly Retacrit  injections 10,000 units.  Overall, she feels well.  Daughter works she was slightly more energetic following her iron  infusions.  She is unsure if the B12 has helped her much.  She has noted a rash around unsure if it is related.  She is also been slightly more "achy than usual.  Appetite and energy levels are 100%.  Has pain to her right side and hip that is chronic.  Has chronic constipation and increased urinary frequency. Overall feels well.   No bleeding. No recent hospitalizations or surgeries.   PAST MEDICAL HISTORY:   Past Medical History: Past Medical History:  Diagnosis Date   Anemia    Chronic kidney disease    Chronic pain    Back, right hip and right knee   Diabetes mellitus without complication (HCC)    Diplopia    Hypercholesteremia    Hypertension     Surgical History: Past Surgical History:  Procedure Laterality Date   CHOLECYSTECTOMY     CYST EXCISION      Social History: Social History   Socioeconomic History   Marital status: Widowed    Spouse name: Not on file   Number of children: Not on file   Years of education: Not on file   Highest education level: Not on file  Occupational History   Not on file  Tobacco Use   Smoking status: Never   Smokeless tobacco: Never  Vaping Use   Vaping status: Never Used  Substance and Sexual Activity   Alcohol use: Never   Drug use: Never   Sexual activity: Not Currently    Birth control/protection: None  Other Topics Concern   Not on file  Social History Narrative   Lives with daughter   Social Drivers of Health   Financial Resource Strain: Not on file  Food Insecurity: No Food Insecurity (09/22/2023)   Hunger Vital Sign    Worried About Running Out of Food in the Last Year: Never true    Ran Out of  Food in the Last Year: Never true  Transportation Needs: No Transportation Needs (09/22/2023)   PRAPARE - Administrator, Civil Service (Medical): No    Lack of Transportation (Non-Medical): No  Physical Activity: Not on file  Stress: Not on file  Social Connections: Not on file  Intimate Partner Violence: Not At Risk (09/22/2023)   Humiliation, Afraid, Rape, and Kick questionnaire    Fear  of Current or Ex-Partner: No    Emotionally Abused: No    Physically Abused: No    Sexually Abused: No    Family History: Family History  Problem Relation Age of Onset   Leukemia Mother    Heart disease Father     Current Medications:  Current Outpatient Medications:    ACCU-CHEK GUIDE test strip, , Disp: , Rfl:    Accu-Chek Softclix Lancets lancets, , Disp: , Rfl:    acetaminophen (TYLENOL) 500 MG tablet, Take 500 mg by mouth 2 (two) times a day., Disp: , Rfl:    allopurinol (ZYLOPRIM) 100 MG tablet, Take 100 mg by mouth daily., Disp: , Rfl:    amLODipine (NORVASC) 5 MG tablet, Take 5 mg by mouth daily., Disp: , Rfl:    aspirin EC 81 MG tablet, Take 81 mg by mouth daily., Disp: , Rfl:    B-D ULTRAFINE III SHORT PEN 31G X 8 MM MISC, Inject into the skin as directed., Disp: , Rfl:    calcitRIOL (ROCALTROL) 0.25 MCG capsule, Take 0.25 mcg by mouth daily., Disp: , Rfl:    Cholecalciferol 125 MCG (5000 UT) capsule, Take 5,000 Units by mouth daily., Disp: , Rfl:    epoetin  alfa (EPOGEN ) 4000 UNIT/ML injection, Inject 8,000 Units into the skin every 14 (fourteen) days. , Disp: , Rfl:    furosemide (LASIX) 20 MG tablet, Take 20 mg by mouth., Disp: , Rfl:    glipiZIDE (GLUCOTROL) 5 MG tablet, Take by mouth 2 (two) times daily before a meal. Take 2 tablets twice daily, Disp: , Rfl:    iron  polysaccharides (NIFEREX) 150 MG capsule, Take 150 mg by mouth 2 (two) times daily., Disp: , Rfl:    labetalol (NORMODYNE) 200 MG tablet, Take 200 mg by mouth 2 (two) times daily. Take 2 tablets twice  daily, Disp: , Rfl:    linagliptin (TRADJENTA) 5 MG TABS tablet, Take 5 mg by mouth daily., Disp: , Rfl:    pravastatin (PRAVACHOL) 40 MG tablet, Take 40 mg by mouth at bedtime., Disp: , Rfl:    RABEprazole (ACIPHEX) 20 MG tablet, Take 20 mg by mouth daily as needed. 1 tablet by mouth everyday as needed for indigestion, Disp: , Rfl:    TRESIBA FLEXTOUCH 100 UNIT/ML FlexTouch Pen, SMARTSIG:26 Unit(s) SUB-Q Daily, Disp: , Rfl:    Allergies: No Known Allergies  REVIEW OF SYSTEMS:   Review of Systems  Gastrointestinal:  Positive for constipation.  Musculoskeletal:  Positive for arthralgias.  Skin:  Positive for rash.     VITALS:   Blood pressure 127/72, pulse 68, temperature 98 F (36.7 C), temperature source Oral, resp. rate 18, weight 170 lb 14.4 oz (77.5 kg), SpO2 100%.  Wt Readings from Last 3 Encounters:  02/03/24 170 lb 14.4 oz (77.5 kg)  10/13/23 171 lb 11.8 oz (77.9 kg)  09/22/23 173 lb 3.2 oz (78.6 kg)    Body mass index is 31.26 kg/m.   PHYSICAL EXAM:   Physical Exam Constitutional:      Appearance: Normal appearance.  Cardiovascular:     Rate and Rhythm: Normal rate and regular rhythm.  Pulmonary:     Effort: Pulmonary effort is normal.     Breath sounds: Normal breath sounds.  Abdominal:     General: Bowel sounds are normal.     Palpations: Abdomen is soft.  Musculoskeletal:        General: No swelling. Normal range of motion.  Neurological:     Mental Status: She  is alert and oriented to person, place, and time. Mental status is at baseline.     LABS:      Latest Ref Rng & Units 02/03/2024   12:04 PM 01/19/2024   10:50 AM 01/05/2024   10:03 AM  CBC  WBC 4.0 - 10.5 K/uL 4.8  4.8  4.8   Hemoglobin 12.0 - 15.0 g/dL 9.1  9.4  9.0   Hematocrit 36.0 - 46.0 % 30.4  30.7  29.8   Platelets 150 - 400 K/uL 190  205  185       Latest Ref Rng & Units 02/03/2024   12:04 PM 12/22/2023   10:10 AM 09/30/2023   10:44 AM  CMP  Glucose 70 - 99 mg/dL 161  74  096    BUN 8 - 23 mg/dL 67  57  68   Creatinine 0.44 - 1.00 mg/dL 0.45  4.09  8.11   Sodium 135 - 145 mmol/L 138  140  143   Potassium 3.5 - 5.1 mmol/L 4.3  3.9  4.3   Chloride 98 - 111 mmol/L 102  102  107   CO2 22 - 32 mmol/L 23  24  25    Calcium 8.9 - 10.3 mg/dL 9.7  9.6    9.8  9.7   Total Protein 6.5 - 8.1 g/dL 7.0     Total Bilirubin 0.0 - 1.2 mg/dL 0.8     Alkaline Phos 38 - 126 U/L 60     AST 15 - 41 U/L 18     ALT 0 - 44 U/L 18        No results found for: "CEA1", "CEA" / No results found for: "CEA1", "CEA" No results found for: "PSA1" No results found for: "CAN199" No results found for: "CAN125"  Lab Results  Component Value Date   TOTALPROTELP 7.2 09/22/2023   TOTALPROTELP 7.2 09/22/2023   ALBUMINELP 4.1 09/22/2023   A1GS 0.3 09/22/2023   A2GS 0.9 09/22/2023   BETS 1.4 (H) 09/22/2023   GAMS 0.5 09/22/2023   MSPIKE Not Observed 09/22/2023   SPEI Comment 09/22/2023   Lab Results  Component Value Date   TIBC 263 02/03/2024   TIBC 287 09/22/2023   TIBC 287 01/07/2023   FERRITIN 876 (H) 02/03/2024   FERRITIN 598 (H) 09/22/2023   FERRITIN 659 (H) 08/06/2022   IRONPCTSAT 26 02/03/2024   IRONPCTSAT 15 09/22/2023   IRONPCTSAT 23 01/07/2023   Lab Results  Component Value Date   LDH 134 09/22/2023     STUDIES:   No results found.

## 2024-02-03 NOTE — Progress Notes (Signed)
 Stephanie Middleton presents today for injections per the provider's orders. Retacrit  10,000U and Vitamin B12  administration without incident; injection site WNL; see MAR for injection details.  Patient tolerated procedure well and without incident.  No questions or complaints noted at this time. Patient's hemoglobin noted to be 9.1 today.  Discharged from clinic via wheelchair in stable condition. Alert and oriented x 3. F/U with [facility] as scheduled.

## 2024-02-03 NOTE — Patient Instructions (Signed)
 [facility] [place] - A DEPT OF [facility]  Discharge Instructions: Thank you for choosing [facility] to provide your oncology and hematology care.  If you have a lab appointment with the Cancer Center - please note that after April 8th, 2024, all labs will be drawn in the cancer center.  You do not have to check in or register with the main entrance as you have in the past but will complete your check-in in the cancer center.  Wear comfortable clothing and clothing appropriate for easy access to any Portacath or PICC line.   We strive to give you quality time with your provider. You may need to reschedule your appointment if you arrive late (15 or more minutes).  Arriving late affects you and other patients whose appointments are after yours.  Also, if you miss three or more appointments without notifying the office, you may be dismissed from the clinic at the provider's discretion.      For prescription refill requests, have your pharmacy contact our office and allow 72 hours for refills to be completed.    Today you received Vitamin B12 and Retacrit  10,000U injections     BELOW ARE SYMPTOMS THAT SHOULD BE REPORTED IMMEDIATELY: *FEVER GREATER THAN 100.4 F (38 C) OR HIGHER *CHILLS OR SWEATING *NAUSEA AND VOMITING THAT IS NOT CONTROLLED WITH YOUR NAUSEA MEDICATION *UNUSUAL SHORTNESS OF BREATH *UNUSUAL BRUISING OR BLEEDING *URINARY PROBLEMS (pain or burning when urinating, or frequent urination) *BOWEL PROBLEMS (unusual diarrhea, constipation, pain near the anus) TENDERNESS IN MOUTH AND THROAT WITH OR WITHOUT PRESENCE OF ULCERS (sore throat, sores in mouth, or a toothache) UNUSUAL RASH, SWELLING OR PAIN  UNUSUAL VAGINAL DISCHARGE OR ITCHING   Items with * indicate a potential emergency and should be followed up as soon as possible or go to the Emergency Department if any problems should occur.  Please show the CHEMOTHERAPY ALERT CARD or  IMMUNOTHERAPY ALERT CARD at check-in to the Emergency Department and triage nurse.  Should you have questions after your visit or need to cancel or reschedule your appointment, please contact [facility] [place] - A DEPT OF [facility] [phone]  and follow the prompts.  Office hours are 8:00 a.m. to 4:30 p.m. Monday - Friday. Please note that voicemails left after 4:00 p.m. may not be returned until the following business day.  We are closed weekends and major holidays. You have access to a nurse at all times for urgent questions. Please call the main number to the clinic [phone] and follow the prompts.  For any non-urgent questions, you may also contact your provider using MyChart. We now offer e-Visits for anyone 69 and older to request care online for non-urgent symptoms. For details visit [email or web address].   Also download the MyChart app! Go to the app store, search "MyChart", open the app, select [place], and log in with your MyChart username and password.

## 2024-02-07 LAB — METHYLMALONIC ACID, SERUM: Methylmalonic Acid, Quantitative: 401 nmol/L — ABNORMAL HIGH (ref 0–378)

## 2024-02-10 ENCOUNTER — Inpatient Hospital Stay: Payer: Medicare PPO | Admitting: Oncology

## 2024-02-10 ENCOUNTER — Inpatient Hospital Stay: Payer: Medicare PPO

## 2024-02-16 ENCOUNTER — Inpatient Hospital Stay

## 2024-02-16 VITALS — BP 133/56 | HR 62 | Temp 96.2°F | Resp 18

## 2024-02-16 DIAGNOSIS — Z79899 Other long term (current) drug therapy: Secondary | ICD-10-CM | POA: Diagnosis not present

## 2024-02-16 DIAGNOSIS — E538 Deficiency of other specified B group vitamins: Secondary | ICD-10-CM | POA: Diagnosis not present

## 2024-02-16 DIAGNOSIS — I120 Hypertensive chronic kidney disease with stage 5 chronic kidney disease or end stage renal disease: Secondary | ICD-10-CM | POA: Diagnosis not present

## 2024-02-16 DIAGNOSIS — D509 Iron deficiency anemia, unspecified: Secondary | ICD-10-CM | POA: Diagnosis not present

## 2024-02-16 DIAGNOSIS — K30 Functional dyspepsia: Secondary | ICD-10-CM | POA: Diagnosis not present

## 2024-02-16 DIAGNOSIS — D631 Anemia in chronic kidney disease: Secondary | ICD-10-CM

## 2024-02-16 DIAGNOSIS — N185 Chronic kidney disease, stage 5: Secondary | ICD-10-CM | POA: Diagnosis not present

## 2024-02-16 DIAGNOSIS — K5909 Other constipation: Secondary | ICD-10-CM | POA: Diagnosis not present

## 2024-02-16 DIAGNOSIS — R768 Other specified abnormal immunological findings in serum: Secondary | ICD-10-CM | POA: Diagnosis not present

## 2024-02-16 LAB — CBC
HCT: 29.2 % — ABNORMAL LOW (ref 36.0–46.0)
Hemoglobin: 9.2 g/dL — ABNORMAL LOW (ref 12.0–15.0)
MCH: 31.2 pg (ref 26.0–34.0)
MCHC: 31.5 g/dL (ref 30.0–36.0)
MCV: 99 fL (ref 80.0–100.0)
Platelets: 195 10*3/uL (ref 150–400)
RBC: 2.95 MIL/uL — ABNORMAL LOW (ref 3.87–5.11)
RDW: 15.7 % — ABNORMAL HIGH (ref 11.5–15.5)
WBC: 5.1 10*3/uL (ref 4.0–10.5)
nRBC: 0 % (ref 0.0–0.2)

## 2024-02-16 MED ORDER — EPOETIN ALFA 10000 UNIT/ML IJ SOLN
10000.0000 [IU] | Freq: Once | INTRAMUSCULAR | Status: AC
  Administered 2024-02-16: 10000 [IU] via SUBCUTANEOUS
  Filled 2024-02-16: qty 1

## 2024-02-16 NOTE — Patient Instructions (Signed)

## 2024-02-16 NOTE — Progress Notes (Signed)
 Patient tolerated injection with no complaints voiced.  Site clean and dry with no bruising or swelling noted at site.  See MAR for details.  Band aid applied.  Patient stable during and after injection.  Vss with discharge and left in satisfactory condition with no s/s of distress noted.

## 2024-03-01 ENCOUNTER — Inpatient Hospital Stay

## 2024-03-01 VITALS — BP 145/64 | HR 62 | Temp 98.6°F | Resp 19

## 2024-03-01 DIAGNOSIS — D509 Iron deficiency anemia, unspecified: Secondary | ICD-10-CM | POA: Diagnosis not present

## 2024-03-01 DIAGNOSIS — N185 Chronic kidney disease, stage 5: Secondary | ICD-10-CM | POA: Diagnosis not present

## 2024-03-01 DIAGNOSIS — D631 Anemia in chronic kidney disease: Secondary | ICD-10-CM | POA: Diagnosis not present

## 2024-03-01 DIAGNOSIS — K5909 Other constipation: Secondary | ICD-10-CM | POA: Diagnosis not present

## 2024-03-01 DIAGNOSIS — E538 Deficiency of other specified B group vitamins: Secondary | ICD-10-CM | POA: Diagnosis not present

## 2024-03-01 DIAGNOSIS — K30 Functional dyspepsia: Secondary | ICD-10-CM | POA: Diagnosis not present

## 2024-03-01 DIAGNOSIS — Z79899 Other long term (current) drug therapy: Secondary | ICD-10-CM | POA: Diagnosis not present

## 2024-03-01 DIAGNOSIS — R768 Other specified abnormal immunological findings in serum: Secondary | ICD-10-CM | POA: Diagnosis not present

## 2024-03-01 DIAGNOSIS — I120 Hypertensive chronic kidney disease with stage 5 chronic kidney disease or end stage renal disease: Secondary | ICD-10-CM | POA: Diagnosis not present

## 2024-03-01 LAB — CBC
HCT: 31.4 % — ABNORMAL LOW (ref 36.0–46.0)
Hemoglobin: 9.7 g/dL — ABNORMAL LOW (ref 12.0–15.0)
MCH: 31.5 pg (ref 26.0–34.0)
MCHC: 30.9 g/dL (ref 30.0–36.0)
MCV: 101.9 fL — ABNORMAL HIGH (ref 80.0–100.0)
Platelets: 214 10*3/uL (ref 150–400)
RBC: 3.08 MIL/uL — ABNORMAL LOW (ref 3.87–5.11)
RDW: 15.8 % — ABNORMAL HIGH (ref 11.5–15.5)
WBC: 5.6 10*3/uL (ref 4.0–10.5)
nRBC: 0 % (ref 0.0–0.2)

## 2024-03-01 MED ORDER — CYANOCOBALAMIN 1000 MCG/ML IJ SOLN
1000.0000 ug | Freq: Once | INTRAMUSCULAR | Status: AC
  Administered 2024-03-01: 1000 ug via INTRAMUSCULAR
  Filled 2024-03-01: qty 1

## 2024-03-01 MED ORDER — EPOETIN ALFA 10000 UNIT/ML IJ SOLN
10000.0000 [IU] | Freq: Once | INTRAMUSCULAR | Status: AC
  Administered 2024-03-01: 10000 [IU] via SUBCUTANEOUS
  Filled 2024-03-01: qty 1

## 2024-03-01 NOTE — Patient Instructions (Signed)
 [facility] - A DEPT OF [facility]  Discharge Instructions: Thank you for choosing [facility] to provide your oncology and hematology care.  If you have a lab appointment with the Cancer Center - please note that after April 8th, 2024, all labs will be drawn in the cancer center.  You do not have to check in or register with the main entrance as you have in the past but will complete your check-in in the cancer center.  Wear comfortable clothing and clothing appropriate for easy access to any Portacath or PICC line.   We strive to give you quality time with your provider. You may need to reschedule your appointment if you arrive late (15 or more minutes).  Arriving late affects you and other patients whose appointments are after yours.  Also, if you miss three or more appointments without notifying the office, you may be dismissed from the clinic at the provider's discretion.      For prescription refill requests, have your pharmacy contact our office and allow 72 hours for refills to be completed.    Today you received the following:  Retacrit/Vitamin B12.  Epoetin Alfa Injection What is this medication? EPOETIN ALFA (e POE e tin AL fa) treats low levels of red blood cells (anemia) caused by kidney disease, chemotherapy, or HIV medications. It can also be used in people who are at risk for blood loss during surgery. It works by Systems analyst make more red blood cells, which reduces the need for blood transfusions. This medicine may be used for other purposes; ask your health care provider or pharmacist if you have questions. COMMON BRAND NAME(S): Epogen, Procrit, Retacrit What should I tell my care team before I take this medication? They need to know if you have any of these conditions: Blood clots Cancer Heart disease High blood pressure On dialysis Seizures Stroke An unusual or allergic reaction to epoetin alfa, albumin, benzyl  alcohol, other medications, foods, dyes, or preservatives Pregnant or trying to get pregnant Breast-feeding How should I use this medication? This medication is injected into a vein or under the skin. It is usually given by your care team in a hospital or clinic setting. It may also be given at home. If you get this medication at home, you will be taught how to prepare and give it. Use exactly as directed. Take it as directed on the prescription label at the same time every day. Keep taking it unless your care team tells you to stop. It is important that you put your used needles and syringes in a special sharps container. Do not put them in a trash can. If you do not have a sharps container, call your pharmacist or care team to get one. A special MedGuide will be given to you by the pharmacist with each prescription and refill. Be sure to read this information carefully each time. Talk to your care team about the use of this medication in children. While this medication may be used in children as young as 1 month of age for selected conditions, precautions do apply. Overdosage: If you think you have taken too much of this medicine contact a poison control center or emergency room at once. NOTE: This medicine is only for you. Do not share this medicine with others. What if I miss a dose? If you miss a dose, take it as soon as you can. If it is almost time for your  next dose, take only that dose. Do not take double or extra doses. What may interact with this medication? Darbepoetin alfa Methoxy polyethylene glycol-epoetin beta This list may not describe all possible interactions. Give your health care provider a list of all the medicines, herbs, non-prescription drugs, or dietary supplements you use. Also tell them if you smoke, drink alcohol, or use illegal drugs. Some items may interact with your medicine. What should I watch for while using this medication? Visit your care team for regular checks  on your progress. Check your blood pressure as directed. Know what your blood pressure should be and when to contact your care team. Your condition will be monitored carefully while you are receiving this medication. You may need blood work while taking this medication. What side effects may I notice from receiving this medication? Side effects that you should report to your care team as soon as possible: Allergic reactions--skin rash, itching, hives, swelling of the face, lips, tongue, or throat Blood clot--pain, swelling, or warmth in the leg, shortness of breath, chest pain Heart attack--pain or tightness in the chest, shoulders, arms, or jaw, nausea, shortness of breath, cold or clammy skin, feeling faint or lightheaded Increase in blood pressure Rash, fever, and swollen lymph nodes Redness, blistering, peeling, or loosening of the skin, including inside the mouth Seizures Stroke--sudden numbness or weakness of the face, arm, or leg, trouble speaking, confusion, trouble walking, loss of balance or coordination, dizziness, severe headache, change in vision Side effects that usually do not require medical attention (report to your care team if they continue or are bothersome): Bone, joint, or muscle pain Cough Headache Nausea Pain, redness, or irritation at injection site This list may not describe all possible side effects. Call your doctor for medical advice about side effects. You may report side effects to FDA at 1-800-FDA-1088. Where should I keep my medication? Keep out of the reach of children and pets. Store in a refrigerator. Do not freeze. Do not shake. Protect from light. Keep this medication in the original container until you are ready to take it. See product for storage information. Get rid of any unused medication after the expiration date. To get rid of medications that are no longer needed or have expired: Take the medication to a medication take-back program. Check with  your pharmacy or law enforcement to find a location. If you cannot return the medication, ask your pharmacist or care team how to get rid of the medication safely. NOTE: This sheet is a summary. It may not cover all possible information. If you have questions about this medicine, talk to your doctor, pharmacist, or health care provider.  2024 Elsevier/Gold Standard (2022-01-23 00:00:00)    Vitamin B12 Injection What is this medication? Vitamin B12 (VAHY tuh min B12) prevents and treats low vitamin B12 levels in your body. It is used in people who do not get enough vitamin B12 from their diet or when their digestive tract does not absorb enough. Vitamin B12 plays an important role in maintaining the health of your nervous system and red blood cells. This medicine may be used for other purposes; ask your health care provider or pharmacist if you have questions. COMMON BRAND NAME(S): B-12 Compliance Kit, B-12 Injection Kit, Cyomin, Dodex, LA-12, Nutri-Twelve, Physicians EZ Use B-12, Primabalt, Vitamin Deficiency Injectable System - B12 What should I tell my care team before I take this medication? They need to know if you have any of these conditions: Kidney disease Leber's disease Megaloblastic  anemia An unusual or allergic reaction to cyanocobalamin, cobalt, other medications, foods, dyes, or preservatives Pregnant or trying to get pregnant Breast-feeding How should I use this medication? This medication is injected into a muscle or deeply under the skin. It is usually given in a clinic or care team's office. However, your care team may teach you how to inject yourself. Follow all instructions. Talk to your care team about the use of this medication in children. Special care may be needed. Overdosage: If you think you have taken too much of this medicine contact a poison control center or emergency room at once. NOTE: This medicine is only for you. Do not share this medicine with  others. What if I miss a dose? If you are given your dose at a clinic or care team's office, call to reschedule your appointment. If you give your own injections, and you miss a dose, take it as soon as you can. If it is almost time for your next dose, take only that dose. Do not take double or extra doses. What may interact with this medication? Alcohol Colchicine This list may not describe all possible interactions. Give your health care provider a list of all the medicines, herbs, non-prescription drugs, or dietary supplements you use. Also tell them if you smoke, drink alcohol, or use illegal drugs. Some items may interact with your medicine. What should I watch for while using this medication? Visit your care team regularly. You may need blood work done while you are taking this medication. You may need to follow a special diet. Talk to your care team. Limit your alcohol intake and avoid smoking to get the best benefit. What side effects may I notice from receiving this medication? Side effects that you should report to your care team as soon as possible: Allergic reactions--skin rash, itching, hives, swelling of the face, lips, tongue, or throat Swelling of the ankles, hands, or feet Trouble breathing Side effects that usually do not require medical attention (report to your care team if they continue or are bothersome): Diarrhea This list may not describe all possible side effects. Call your doctor for medical advice about side effects. You may report side effects to FDA at 1-800-FDA-1088. Where should I keep my medication? Keep out of the reach of children. Store at room temperature between 15 and 30 degrees C (59 and 85 degrees F). Protect from light. Throw away any unused medication after the expiration date. NOTE: This sheet is a summary. It may not cover all possible information. If you have questions about this medicine, talk to your doctor, pharmacist, or health care provider.   2024 Elsevier/Gold Standard (2021-06-03 00:00:00)     To help prevent nausea and vomiting after your treatment, we encourage you to take your nausea medication as directed.  BELOW ARE SYMPTOMS THAT SHOULD BE REPORTED IMMEDIATELY: *FEVER GREATER THAN 100.4 F (38 C) OR HIGHER *CHILLS OR SWEATING *NAUSEA AND VOMITING THAT IS NOT CONTROLLED WITH YOUR NAUSEA MEDICATION *UNUSUAL SHORTNESS OF BREATH *UNUSUAL BRUISING OR BLEEDING *URINARY PROBLEMS (pain or burning when urinating, or frequent urination) *BOWEL PROBLEMS (unusual diarrhea, constipation, pain near the anus) TENDERNESS IN MOUTH AND THROAT WITH OR WITHOUT PRESENCE OF ULCERS (sore throat, sores in mouth, or a toothache) UNUSUAL RASH, SWELLING OR PAIN  UNUSUAL VAGINAL DISCHARGE OR ITCHING   Items with * indicate a potential emergency and should be followed up as soon as possible or go to the Emergency Department if any problems should occur.  Please  show the CHEMOTHERAPY ALERT CARD or IMMUNOTHERAPY ALERT CARD at check-in to the Emergency Department and triage nurse.  Should you have questions after your visit or need to cancel or reschedule your appointment, please contact [facility]  - A DEPT OF [facility] [phone]  and follow the prompts.  Office hours are 8:00 a.m. to 4:30 p.m. Monday - Friday. Please note that voicemails left after 4:00 p.m. may not be returned until the following business day.  We are closed weekends and major holidays. You have access to a nurse at all times for urgent questions. Please call the main number to the clinic [phone] and follow the prompts.  For any non-urgent questions, you may also contact your provider using MyChart. We now offer e-Visits for anyone 31 and older to request care online for non-urgent symptoms. For details visit [email or web address].   Also download the MyChart app! Go to the app store, search "MyChart", open the app, select [place], and  log in with your MyChart username and password.

## 2024-03-01 NOTE — Progress Notes (Signed)
 Hemoglobin today is 9.7  We will proceed with Retacrit  and Vitamin B12 injections per provider orders.   Patient tolerated injections with no complaints voiced.  Site clean and dry with no bruising or swelling noted.  No complaints of pain.  Discharged with vital signs stable and no signs or symptoms of distress noted.

## 2024-03-15 ENCOUNTER — Inpatient Hospital Stay

## 2024-03-15 ENCOUNTER — Inpatient Hospital Stay: Attending: Hematology

## 2024-03-15 VITALS — BP 154/65 | HR 65 | Temp 98.6°F | Resp 16

## 2024-03-15 DIAGNOSIS — N189 Chronic kidney disease, unspecified: Secondary | ICD-10-CM

## 2024-03-15 DIAGNOSIS — D631 Anemia in chronic kidney disease: Secondary | ICD-10-CM | POA: Insufficient documentation

## 2024-03-15 DIAGNOSIS — N185 Chronic kidney disease, stage 5: Secondary | ICD-10-CM | POA: Diagnosis not present

## 2024-03-15 DIAGNOSIS — E538 Deficiency of other specified B group vitamins: Secondary | ICD-10-CM | POA: Diagnosis not present

## 2024-03-15 LAB — CBC
HCT: 29 % — ABNORMAL LOW (ref 36.0–46.0)
Hemoglobin: 9.2 g/dL — ABNORMAL LOW (ref 12.0–15.0)
MCH: 31.9 pg (ref 26.0–34.0)
MCHC: 31.7 g/dL (ref 30.0–36.0)
MCV: 100.7 fL — ABNORMAL HIGH (ref 80.0–100.0)
Platelets: 196 10*3/uL (ref 150–400)
RBC: 2.88 MIL/uL — ABNORMAL LOW (ref 3.87–5.11)
RDW: 16 % — ABNORMAL HIGH (ref 11.5–15.5)
WBC: 5.2 10*3/uL (ref 4.0–10.5)
nRBC: 0 % (ref 0.0–0.2)

## 2024-03-15 MED ORDER — EPOETIN ALFA 10000 UNIT/ML IJ SOLN
10000.0000 [IU] | Freq: Once | INTRAMUSCULAR | Status: AC
  Administered 2024-03-15: 10000 [IU] via SUBCUTANEOUS
  Filled 2024-03-15: qty 1

## 2024-03-15 NOTE — Progress Notes (Signed)
Patient presents today for Retacrit injection. Hemoglobin reviewed prior to administration. VSS tolerated without incident or complaint. See MAR for details. Patient stable during and after injection. Patient discharged in satisfactory condition with no s/s of distress noted.  

## 2024-03-15 NOTE — Patient Instructions (Signed)

## 2024-03-16 ENCOUNTER — Ambulatory Visit: Admitting: Podiatry

## 2024-03-16 DIAGNOSIS — E1121 Type 2 diabetes mellitus with diabetic nephropathy: Secondary | ICD-10-CM | POA: Diagnosis not present

## 2024-03-16 DIAGNOSIS — Z794 Long term (current) use of insulin: Secondary | ICD-10-CM | POA: Diagnosis not present

## 2024-03-16 DIAGNOSIS — B351 Tinea unguium: Secondary | ICD-10-CM | POA: Diagnosis not present

## 2024-03-16 DIAGNOSIS — R600 Localized edema: Secondary | ICD-10-CM | POA: Diagnosis not present

## 2024-03-16 DIAGNOSIS — M79675 Pain in left toe(s): Secondary | ICD-10-CM | POA: Diagnosis not present

## 2024-03-16 DIAGNOSIS — M79674 Pain in right toe(s): Secondary | ICD-10-CM | POA: Diagnosis not present

## 2024-03-16 NOTE — Progress Notes (Signed)
  Subjective:  Patient ID: Stephanie Middleton, female    DOB: October 04, 1931,  MRN: [id number]  89 y.o. female presents at risk foot care. Pt has h/o NIDDM with chronic kidney disease and painful mycotic toenails of both feet that are difficult to trim. Pain interferes with daily activities and wearing enclosed shoe gear comfortably.  New problem(s): None   PCP is Wilburn Handler, MD , and last visit was January 24, 2024.  No Known Allergies  Review of Systems: Negative except as noted in the HPI.   Objective:  Stephanie P Middleton is a pleasant 89 y.o. female WD, WN in NAD. AAO x 3.  Vascular Examination: Capillary refill time immediate b/l. Vascular status intact b/l with palpable pedal pulses. Pedal hair present b/l. No pain with calf compression b/l. Skin temperature gradient WNL b/l. No cyanosis or clubbing b/l. No ischemia or gangrene noted b/l. Dependent edema noted BLE.  Neurological Examination: Protective sensation intact 5/5 intact bilaterally with 10g monofilament b/l. Vibratory sensation intact right lower extremity. Vibratory sensation diminished left lower extremity.  Dermatological Examination: Pedal skin with normal turgor, texture and tone b/l. No open wounds nor interdigital macerations noted. Toenails 1-5 b/l thick, discolored, elongated with subungual debris and pain on dorsal palpation. No hyperkeratotic lesions noted b/l.   Musculoskeletal Examination: Muscle strength 5/5 to all lower extremity muscle groups bilaterally. HAV with bunion deformity noted b/l LE. Hammertoe deformity noted 2-5 b/l.  Radiographs: None  Last A1c:      Latest Ref Rng & Units 09/30/2023   10:44 AM  Hemoglobin A1C  Hemoglobin-A1c <5.7 % of total Hgb 5.9      Assessment:   1. Pain due to onychomycosis of toenails of both feet   2. Bilateral lower extremity edema   3. Type 2 diabetes mellitus with diabetic nephropathy, with long-term current use of insulin (HCC)    Plan:  -Patient was  evaluated today. All questions/concerns addressed on today's visit. -Patient's family member present. All questions/concerns addressed on today's visit. -Recommended below knee compression hose 15-20 mmHg for lower extremity edema. -Patient to continue soft, supportive shoe gear daily. -Mycotic toenails 1-5 bilaterally were debrided in length and girth with sterile nail nippers and dremel without incident. -Shoe recommendations given for [employer]ua Double Depth shoes for lower extremity edema. -Dispensed tube foam. Apply to bunion every morning. Remove every evening. -Patient/POA to call should there be question/concern in the interim.  Return in about 3 months (around 06/16/2024).  Stephanie Middleton, DPM      [facility] LOCATION: [address] [place] [address]                   Office [phone]   [facility] LOCATION: [address] [place], [place] [address] Office [phone]

## 2024-03-20 ENCOUNTER — Ambulatory Visit: Payer: Self-pay | Admitting: Oncology

## 2024-03-21 ENCOUNTER — Other Ambulatory Visit (HOSPITAL_COMMUNITY)
Admission: RE | Admit: 2024-03-21 | Discharge: 2024-03-21 | Disposition: A | Discharge status: Home / Self Care | Source: Ambulatory Visit | Attending: Nephrology | Admitting: Nephrology

## 2024-03-21 DIAGNOSIS — N189 Chronic kidney disease, unspecified: Secondary | ICD-10-CM | POA: Diagnosis not present

## 2024-03-21 LAB — CBC
HCT: 30.6 % — ABNORMAL LOW (ref 36.0–46.0)
Hemoglobin: 9.6 g/dL — ABNORMAL LOW (ref 12.0–15.0)
MCH: 31.6 pg (ref 26.0–34.0)
MCHC: 31.4 g/dL (ref 30.0–36.0)
MCV: 100.7 fL — ABNORMAL HIGH (ref 80.0–100.0)
Platelets: 225 10*3/uL (ref 150–400)
RBC: 3.04 MIL/uL — ABNORMAL LOW (ref 3.87–5.11)
RDW: 16.1 % — ABNORMAL HIGH (ref 11.5–15.5)
WBC: 5.3 10*3/uL (ref 4.0–10.5)
nRBC: 0 % (ref 0.0–0.2)

## 2024-03-21 LAB — RENAL FUNCTION PANEL
Albumin: 4.3 g/dL (ref 3.5–5.0)
Anion gap: 14 (ref 5–15)
BUN: 66 mg/dL — ABNORMAL HIGH (ref 8–23)
CO2: 24 mmol/L (ref 22–32)
Calcium: 9.6 mg/dL (ref 8.9–10.3)
Chloride: 103 mmol/L (ref 98–111)
Creatinine, Ser: 3.58 mg/dL — ABNORMAL HIGH (ref 0.44–1.00)
GFR, Estimated: 12 mL/min — ABNORMAL LOW (ref >60)
Glucose, Bld: 121 mg/dL — ABNORMAL HIGH (ref 70–99)
Phosphorus: 4.5 mg/dL (ref 2.5–4.6)
Potassium: 4 mmol/L (ref 3.5–5.1)
Sodium: 141 mmol/L (ref 135–145)

## 2024-03-21 LAB — PROTEIN / CREATININE RATIO, URINE
Creatinine, Urine: 49 mg/dL
Protein Creatinine Ratio: 0.37 mg/mg{creat} — ABNORMAL HIGH (ref 0.00–0.15)
Total Protein, Urine: 18 mg/dL

## 2024-03-22 LAB — PARATHYROID HORMONE, INTACT (NO CA): PTH: 157 pg/mL — ABNORMAL HIGH (ref 15–65)

## 2024-03-23 ENCOUNTER — Encounter: Payer: Self-pay | Admitting: Podiatry

## 2024-03-24 ENCOUNTER — Encounter: Payer: Self-pay | Admitting: Emergency Medicine

## 2024-03-24 ENCOUNTER — Ambulatory Visit
Admission: EM | Admit: 2024-03-24 | Discharge: 2024-03-24 | Disposition: A | Discharge status: Home / Self Care | Attending: Nurse Practitioner | Admitting: Nurse Practitioner

## 2024-03-24 ENCOUNTER — Other Ambulatory Visit: Payer: Self-pay

## 2024-03-24 DIAGNOSIS — M25472 Effusion, left ankle: Secondary | ICD-10-CM | POA: Diagnosis not present

## 2024-03-24 DIAGNOSIS — E1122 Type 2 diabetes mellitus with diabetic chronic kidney disease: Secondary | ICD-10-CM | POA: Diagnosis not present

## 2024-03-24 NOTE — ED Triage Notes (Signed)
 Pt reports stepped on a root approx several months ago and reports pain with walking ever since. Reports left ankle swelling has increased, last several days. Pt noted to be wearing post op shoe at time of triage.

## 2024-03-24 NOTE — ED Provider Notes (Signed)
 [facility]    CSN: [id number] Arrival date & time: 03/24/24  1015      History   Chief Complaint Chief Complaint  Patient presents with   Foot Pain    HPI Stephanie Middleton is a 89 y.o. female.   Patient presents today for left ankle and foot swelling for the past several months.  She reports her right foot and ankle is also swollen but not as bad.  Reports a few months ago, she stepped on a stump in her yard and heard a crack in her foot.  She never had any pain, bruising, or numbness/tingling in the foot or ankle.  Reports over the past several days, the ankle swelling has increased in the left foot.  She follows closely with nephrology who gives her furosemide for the leg swelling as well as podiatry who is treating the bunion in the left foot and recommended a compression stocking.  She is also wearing a postop shoe for the bunion.  No current ankle or foot pain, decreased range of motion, or difficulty weightbearing on the left foot or ankle.  No numbness or tingling in the toes on the left foot.    Past Medical History:  Diagnosis Date   Anemia    Chronic kidney disease    Chronic pain    Back, right hip and right knee   Diabetes mellitus without complication (HCC)    Diplopia    Hypercholesteremia    Hypertension     Patient Active Problem List   Diagnosis Date Noted   Anemia in chronic kidney disease 07/25/2010   Chronic kidney disease 07/25/2010   Essential (primary) hypertension 07/25/2010   Low back pain 07/25/2010   Pure hypercholesterolemia 07/25/2010   Type 2 diabetes mellitus with diabetic nephropathy (HCC) 07/25/2010   Type 2 diabetes mellitus with hyperglycemia (HCC) 07/25/2010    Past Surgical History:  Procedure Laterality Date   CHOLECYSTECTOMY     CYST EXCISION      OB History   No obstetric history on file.      Home Medications    Prior to Admission medications   Medication Sig Start Date End Date Taking? Authorizing  Provider  ACCU-CHEK GUIDE test strip  06/18/20   [provider]  Accu-Chek Softclix Lancets lancets  06/18/20   [provider]  acetaminophen (TYLENOL) 500 MG tablet Take 500 mg by mouth 2 (two) times a day.    [provider]  allopurinol (ZYLOPRIM) 100 MG tablet Take 100 mg by mouth daily.    [provider]  amLODipine (NORVASC) 5 MG tablet Take 5 mg by mouth daily.    [provider]  aspirin EC 81 MG tablet Take 81 mg by mouth daily.    [provider]  B-D ULTRAFINE III SHORT PEN 31G X 8 MM MISC Inject into the skin as directed. 04/10/22   [provider]  calcitRIOL (ROCALTROL) 0.25 MCG capsule Take 0.25 mcg by mouth daily.    [provider]  Cholecalciferol 125 MCG (5000 UT) capsule Take 5,000 Units by mouth daily.    [provider]  epoetin  alfa (EPOGEN ) 4000 UNIT/ML injection Inject 8,000 Units into the skin every 14 (fourteen) days.     Chris Countryman, MD  furosemide (LASIX) 20 MG tablet Take 20 mg by mouth.    [provider]  glipiZIDE (GLUCOTROL) 5 MG tablet Take by mouth 2 (two) times daily before a meal. Take 2 tablets twice  daily    [provider]  iron  polysaccharides (NIFEREX) 150 MG capsule Take 150 mg by mouth 2 (two) times daily.    [provider]  labetalol (NORMODYNE) 200 MG tablet Take 200 mg by mouth 2 (two) times daily. Take 2 tablets twice daily    [provider]  linagliptin (TRADJENTA) 5 MG TABS tablet Take 5 mg by mouth daily.    [provider]  pravastatin (PRAVACHOL) 40 MG tablet Take 40 mg by mouth at bedtime.    [provider]  RABEprazole (ACIPHEX) 20 MG tablet Take 20 mg by mouth daily as needed. 1 tablet by mouth everyday as needed for indigestion    [provider]  TRESIBA FLEXTOUCH 100 UNIT/ML FlexTouch Pen SMARTSIG:26 Unit(s) SUB-Q Daily    [provider]    Family History Family History   Problem Relation Age of Onset   Leukemia Mother    Heart disease Father     Social History Social History   Tobacco Use   Smoking status: Never   Smokeless tobacco: Never  Vaping Use   Vaping status: Never Used  Substance Use Topics   Alcohol use: Never   Drug use: Never     Allergies   Patient has no known allergies.   Review of Systems Review of Systems Per HPI  Physical Exam Triage Vital Signs ED Triage Vitals  Encounter Vitals Group     BP 03/24/24 1027 (!) 174/79     Girls Systolic BP Percentile --      Girls Diastolic BP Percentile --      Boys Systolic BP Percentile --      Boys Diastolic BP Percentile --      Pulse Rate 03/24/24 1027 70     Resp 03/24/24 1027 18     Temp 03/24/24 1027 98.3 F (36.8 C)     Temp Source 03/24/24 1027 Oral     SpO2 03/24/24 1027 98 %     Weight --      Height --      Head Circumference --      Peak Flow --      Pain Score 03/24/24 1028 2     Pain Loc --      Pain Education --      Exclude from Growth Chart --    No data found.  Updated Vital Signs BP (!) 174/79 (BP Location: Right Arm)   Pulse 70   Temp 98.3 F (36.8 C) (Oral)   Resp 18   SpO2 98%   Visual Acuity Right Eye Distance:   Left Eye Distance:   Bilateral Distance:    Right Eye Near:   Left Eye Near:    Bilateral Near:     Physical Exam Vitals and nursing note reviewed.  Constitutional:      General: She is not in acute distress.    Appearance: Normal appearance. She is not toxic-appearing.  HENT:     Mouth/Throat:     Mouth: Mucous membranes are moist.     Pharynx: Oropharynx is clear.  Pulmonary:     Effort: Pulmonary effort is normal. No respiratory distress.   Musculoskeletal:     Comments: Inspection: 2+ pitting edema bilateral lower extremities; no  bruising, obvious deformity or redness Palpation: left ankle and foot are nontender to palpation diffusely; no obvious deformities palpated ROM: Full ROM to left foot and  ankle Strength: 5/5 left lower extremity Neurovascular: neurovascularly intact in distal left  lower extremity    Skin:    General: Skin is warm and dry.     Capillary Refill: Capillary refill takes less than 2 seconds.     Coloration: Skin is not jaundiced or pale.     Findings: No erythema.   Neurological:     Mental Status: She is alert and oriented to person, place, and time.   Psychiatric:        Behavior: Behavior is cooperative.      UC Treatments / Results  Labs (all labs ordered are listed, but only abnormal results are displayed) Labs Reviewed - No data to display  EKG   Radiology No results found.  Procedures Procedures (including critical care time)  Medications Ordered in UC Medications - No data to display  Initial Impression / Assessment and Plan / UC Course  I have reviewed the triage vital signs and the nursing notes.  Pertinent labs & imaging results that were available during my care of the patient were reviewed by me and considered in my medical decision making (see chart for details).   Patient is mildly hypertensive in triage today, otherwise vital signs are stable.  1. Left ankle swelling Given no recent injury, fall, trauma, or ankle/foot pain, x-ray imaging deferred Suspect ongoing swelling is likely multifactorial including due to injury months ago coupled with stage V CKD, anemia, and type 2 diabetes Recommended elevation, compression and Ace wrap applied today Recommended follow-up with specialist as planned   The patient was given the opportunity to ask questions.  All questions answered to their satisfaction.  The patient is in agreement to this plan.   Final Clinical Impressions(s) / UC Diagnoses   Final diagnoses:  Left ankle swelling     Discharge Instructions      Please wear the ace wrap to help with swelling in your foot and ankle.  Recommend wearing compression stockings at home as recommended by your previous provider.   Seek care if pain develops.   ED Prescriptions   None    PDMP not reviewed this encounter.   Wilhemena Harbour, NP 03/24/24 1105

## 2024-03-24 NOTE — Discharge Instructions (Signed)
 Please wear the ace wrap to help with swelling in your foot and ankle.  Recommend wearing compression stockings at home as recommended by your previous provider.  Seek care if pain develops.

## 2024-03-28 ENCOUNTER — Other Ambulatory Visit: Payer: Self-pay

## 2024-03-28 DIAGNOSIS — D631 Anemia in chronic kidney disease: Secondary | ICD-10-CM

## 2024-03-29 ENCOUNTER — Inpatient Hospital Stay

## 2024-03-29 VITALS — BP 149/58 | HR 64 | Temp 97.2°F | Resp 18

## 2024-03-29 DIAGNOSIS — E538 Deficiency of other specified B group vitamins: Secondary | ICD-10-CM | POA: Diagnosis not present

## 2024-03-29 DIAGNOSIS — D631 Anemia in chronic kidney disease: Secondary | ICD-10-CM

## 2024-03-29 DIAGNOSIS — N185 Chronic kidney disease, stage 5: Secondary | ICD-10-CM | POA: Diagnosis not present

## 2024-03-29 LAB — CBC
HCT: 29.5 % — ABNORMAL LOW (ref 36.0–46.0)
Hemoglobin: 9.3 g/dL — ABNORMAL LOW (ref 12.0–15.0)
MCH: 31.2 pg (ref 26.0–34.0)
MCHC: 31.5 g/dL (ref 30.0–36.0)
MCV: 99 fL (ref 80.0–100.0)
Platelets: 203 10*3/uL (ref 150–400)
RBC: 2.98 MIL/uL — ABNORMAL LOW (ref 3.87–5.11)
RDW: 15.7 % — ABNORMAL HIGH (ref 11.5–15.5)
WBC: 4.7 10*3/uL (ref 4.0–10.5)
nRBC: 0 % (ref 0.0–0.2)

## 2024-03-29 MED ORDER — CYANOCOBALAMIN 1000 MCG/ML IJ SOLN
1000.0000 ug | Freq: Once | INTRAMUSCULAR | Status: AC
  Administered 2024-03-29: 1000 ug via INTRAMUSCULAR
  Filled 2024-03-29: qty 1

## 2024-03-29 MED ORDER — EPOETIN ALFA 10000 UNIT/ML IJ SOLN
10000.0000 [IU] | Freq: Once | INTRAMUSCULAR | Status: AC
  Administered 2024-03-29: 10000 [IU] via SUBCUTANEOUS
  Filled 2024-03-29: qty 1

## 2024-03-29 NOTE — Patient Instructions (Signed)
 [facility] [place] - A DEPT OF [facility]  Discharge Instructions: Thank you for choosing [facility] to provide your oncology and hematology care.  If you have a lab appointment with the Cancer Center - please note that after April 8th, 2024, all labs will be drawn in the cancer center.  You do not have to check in or register with the main entrance as you have in the past but will complete your check-in in the cancer center.  Wear comfortable clothing and clothing appropriate for easy access to any Portacath or PICC line.   We strive to give you quality time with your provider. You may need to reschedule your appointment if you arrive late (15 or more minutes).  Arriving late affects you and other patients whose appointments are after yours.  Also, if you miss three or more appointments without notifying the office, you may be dismissed from the clinic at the provider's discretion.      For prescription refill requests, have your pharmacy contact our office and allow 72 hours for refills to be completed.    Today you received the following chemotherapy and/or immunotherapy agents EpogenVitamin B12 Injection What is this medication? Vitamin B12 (VAHY tuh min B12) prevents and treats low vitamin B12 levels in your body. It is used in people who do not get enough vitamin B12 from their diet or when their digestive tract does not absorb enough. Vitamin B12 plays an important role in maintaining the health of your nervous system and red blood cells. This medicine may be used for other purposes; ask your health care provider or pharmacist if you have questions. COMMON BRAND NAME(S): B-12 Compliance Kit, B-12 Injection Kit, Cyomin, Dodex , LA-12, Nutri-Twelve, Physicians EZ Use B-12, Primabalt, Vitamin Deficiency Injectable System - B12 What should I tell my care team before I take this medication? They need to know if you have any of these conditions: Kidney  disease Leber's disease Megaloblastic anemia An unusual or allergic reaction to cyanocobalamin , cobalt, other medications, foods, dyes, or preservatives Pregnant or trying to get pregnant Breast-feeding How should I use this medication? This medication is injected into a muscle or deeply under the skin. It is usually given in a clinic or care team's office. However, your care team may teach you how to inject yourself. Follow all instructions. Talk to your care team about the use of this medication in children. Special care may be needed. Overdosage: If you think you have taken too much of this medicine contact a poison control center or emergency room at once. NOTE: This medicine is only for you. Do not share this medicine with others. What if I miss a dose? If you are given your dose at a clinic or care team's office, call to reschedule your appointment. If you give your own injections, and you miss a dose, take it as soon as you can. If it is almost time for your next dose, take only that dose. Do not take double or extra doses. What may interact with this medication? Alcohol Colchicine This list may not describe all possible interactions. Give your health care provider a list of all the medicines, herbs, non-prescription drugs, or dietary supplements you use. Also tell them if you smoke, drink alcohol, or use illegal drugs. Some items may interact with your medicine. What should I watch for while using this medication? Visit your care team regularly. You may need blood work done while you are  taking this medication. You may need to follow a special diet. Talk to your care team. Limit your alcohol intake and avoid smoking to get the best benefit. What side effects may I notice from receiving this medication? Side effects that you should report to your care team as soon as possible: Allergic reactions--skin rash, itching, hives, swelling of the face, lips, tongue, or throat Swelling of the  ankles, hands, or feet Trouble breathing Side effects that usually do not require medical attention (report to your care team if they continue or are bothersome): Diarrhea This list may not describe all possible side effects. Call your doctor for medical advice about side effects. You may report side effects to FDA at 1-800-FDA-1088. Where should I keep my medication? Keep out of the reach of children. Store at room temperature between 15 and 30 degrees C (59 and 85 degrees F). Protect from light. Throw away any unused medication after the expiration date. NOTE: This sheet is a summary. It may not cover all possible information. If you have questions about this medicine, talk to your doctor, pharmacist, or health care provider.  2024 Elsevier/Gold Standard (2021-06-03 00:00:00)Epoetin  Alfa Injection What is this medication? EPOETIN  ALFA (e POE e tin AL fa) treats low levels of red blood cells (anemia) caused by kidney disease, chemotherapy, or HIV medications. It can also be used in people who are at risk for blood loss during surgery. It works by Systems analyst make more red blood cells, which reduces the need for blood transfusions. This medicine may be used for other purposes; ask your health care provider or pharmacist if you have questions. COMMON BRAND NAME(S): Epogen , Procrit , Retacrit  What should I tell my care team before I take this medication? They need to know if you have any of these conditions: Blood clots Cancer Heart disease High blood pressure On dialysis Seizures Stroke An unusual or allergic reaction to epoetin  alfa, albumin, benzyl alcohol, other medications, foods, dyes, or preservatives Pregnant or trying to get pregnant Breast-feeding How should I use this medication? This medication is injected into a vein or under the skin. It is usually given by your care team in a hospital or clinic setting. It may also be given at home. If you get this medication at home,  you will be taught how to prepare and give it. Use exactly as directed. Take it as directed on the prescription label at the same time every day. Keep taking it unless your care team tells you to stop. It is important that you put your used needles and syringes in a special sharps container. Do not put them in a trash can. If you do not have a sharps container, call your pharmacist or care team to get one. A special MedGuide will be given to you by the pharmacist with each prescription and refill. Be sure to read this information carefully each time. Talk to your care team about the use of this medication in children. While this medication may be used in children as young as 1 month of age for selected conditions, precautions do apply. Overdosage: If you think you have taken too much of this medicine contact a poison control center or emergency room at once. NOTE: This medicine is only for you. Do not share this medicine with others. What if I miss a dose? If you miss a dose, take it as soon as you can. If it is almost time for your next dose, take only that dose. Do not  take double or extra doses. What may interact with this medication? Darbepoetin alfa Methoxy polyethylene glycol-epoetin  beta This list may not describe all possible interactions. Give your health care provider a list of all the medicines, herbs, non-prescription drugs, or dietary supplements you use. Also tell them if you smoke, drink alcohol, or use illegal drugs. Some items may interact with your medicine. What should I watch for while using this medication? Visit your care team for regular checks on your progress. Check your blood pressure as directed. Know what your blood pressure should be and when to contact your care team. Your condition will be monitored carefully while you are receiving this medication. You may need blood work while taking this medication. What side effects may I notice from receiving this medication? Side  effects that you should report to your care team as soon as possible: Allergic reactions--skin rash, itching, hives, swelling of the face, lips, tongue, or throat Blood clot--pain, swelling, or warmth in the leg, shortness of breath, chest pain Heart attack--pain or tightness in the chest, shoulders, arms, or jaw, nausea, shortness of breath, cold or clammy skin, feeling faint or lightheaded Increase in blood pressure Rash, fever, and swollen lymph nodes Redness, blistering, peeling, or loosening of the skin, including inside the mouth Seizures Stroke--sudden numbness or weakness of the face, arm, or leg, trouble speaking, confusion, trouble walking, loss of balance or coordination, dizziness, severe headache, change in vision Side effects that usually do not require medical attention (report to your care team if they continue or are bothersome): Bone, joint, or muscle pain Cough Headache Nausea Pain, redness, or irritation at injection site This list may not describe all possible side effects. Call your doctor for medical advice about side effects. You may report side effects to FDA at 1-800-FDA-1088. Where should I keep my medication? Keep out of the reach of children and pets. Store in a refrigerator. Do not freeze. Do not shake. Protect from light. Keep this medication in the original container until you are ready to take it. See product for storage information. Get rid of any unused medication after the expiration date. To get rid of medications that are no longer needed or have expired: Take the medication to a medication take-back program. Check with your pharmacy or law enforcement to find a location. If you cannot return the medication, ask your pharmacist or care team how to get rid of the medication safely. NOTE: This sheet is a summary. It may not cover all possible information. If you have questions about this medicine, talk to your doctor, pharmacist, or health care provider.   2024 Elsevier/Gold Standard (2022-01-23 00:00:00), B12 injection. Denosumab Injection (Osteoporosis) What is this medication? DENOSUMAB (den oh SUE mab) prevents and treats osteoporosis. It works by Interior and spatial designer stronger and less likely to break (fracture). It is a monoclonal antibody. This medicine may be used for other purposes; ask your health care provider or pharmacist if you have questions. COMMON BRAND NAME(S): Prolia What should I tell my care team before I take this medication? They need to know if you have any of these conditions: Dental or gum disease Had thyroid  or parathyroid  (glands located in neck) surgery Having dental surgery or a tooth pulled Kidney disease Low levels of calcium in the blood On dialysis Poor nutrition Thyroid  disease Trouble absorbing nutrients from your food An unusual or allergic reaction to denosumab, other medications, foods, dyes, or preservatives Pregnant or trying to get pregnant Breastfeeding How should I use  this medication? This medication is injected under the skin. It is given by your care team in a hospital or clinic setting. A special MedGuide will be given to you before each treatment. Be sure to read this information carefully each time. Talk to your care team about the use of this medication in children. Special care may be needed. Overdosage: If you think you have taken too much of this medicine contact a poison control center or emergency room at once. NOTE: This medicine is only for you. Do not share this medicine with others. What if I miss a dose? Keep appointments for follow-up doses. It is important not to miss your dose. Call your care team if you are unable to keep an appointment. What may interact with this medication? Do not take this medication with any of the following: Other medications that contain denosumab This medication may also interact with the following: Medications that lower your chance of fighting  infection Steroid medications, such as prednisone or cortisone This list may not describe all possible interactions. Give your health care provider a list of all the medicines, herbs, non-prescription drugs, or dietary supplements you use. Also tell them if you smoke, drink alcohol, or use illegal drugs. Some items may interact with your medicine. What should I watch for while using this medication? Your condition will be monitored carefully while you are receiving this medication. You may need blood work done while taking this medication. This medication may increase your risk of getting an infection. Call your care team for advice if you get a fever, chills, sore throat, or other symptoms of a cold or flu. Do not treat yourself. Try to avoid being around people who are sick. Tell your dentist and dental surgeon that you are taking this medication. You should not have major dental surgery while on this medication. See your dentist to have a dental exam and fix any dental problems before starting this medication. Take good care of your teeth while on this medication. Make sure you see your dentist for regular follow-up appointments. This medication may cause low levels of calcium in your body. The risk of severe side effects is increased in people with kidney disease. Your care team may prescribe calcium and vitamin D  to help prevent low calcium levels while you take this medication. It is important to take calcium and vitamin D  as directed by your care team. Talk to your care team if you may be pregnant. Serious birth defects may occur if you take this medication during pregnancy and for 5 months after the last dose. You will need a negative pregnancy test before starting this medication. Contraception is recommended while taking this medication and for 5 months after the last dose. Your care team can help you find the option that works for you. Talk to your care team before breastfeeding. Changes to your  treatment plan may be needed. What side effects may I notice from receiving this medication? Side effects that you should report to your care team as soon as possible: Allergic reactions--skin rash, itching, hives, swelling of the face, lips, tongue, or throat Infection--fever, chills, cough, sore throat, wounds that don't heal, pain or trouble when passing urine, general feeling of discomfort or being unwell Low calcium level--muscle pain or cramps, confusion, tingling, or numbness in the hands or feet Osteonecrosis of the jaw--pain, swelling, or redness in the mouth, numbness of the jaw, poor healing after dental work, unusual discharge from the mouth, visible bones in the  mouth Severe bone, joint, or muscle pain Skin infection--skin redness, swelling, warmth, or pain Side effects that usually do not require medical attention (report these to your care team if they continue or are bothersome): Back pain Headache Joint pain Muscle pain Pain in the hands, arms, legs, or feet Runny or stuffy nose Sore throat This list may not describe all possible side effects. Call your doctor for medical advice about side effects. You may report side effects to FDA at 1-800-FDA-1088. Where should I keep my medication? This medication is given in a hospital or clinic. It will not be stored at home. NOTE: This sheet is a summary. It may not cover all possible information. If you have questions about this medicine, talk to your doctor, pharmacist, or health care provider.  2024 Elsevier/Gold Standard (2022-10-27 00:00:00)      To help prevent nausea and vomiting after your treatment, we encourage you to take your nausea medication as directed.  BELOW ARE SYMPTOMS THAT SHOULD BE REPORTED IMMEDIATELY: *FEVER GREATER THAN 100.4 F (38 C) OR HIGHER *CHILLS OR SWEATING *NAUSEA AND VOMITING THAT IS NOT CONTROLLED WITH YOUR NAUSEA MEDICATION *UNUSUAL SHORTNESS OF BREATH *UNUSUAL BRUISING OR BLEEDING *URINARY  PROBLEMS (pain or burning when urinating, or frequent urination) *BOWEL PROBLEMS (unusual diarrhea, constipation, pain near the anus) TENDERNESS IN MOUTH AND THROAT WITH OR WITHOUT PRESENCE OF ULCERS (sore throat, sores in mouth, or a toothache) UNUSUAL RASH, SWELLING OR PAIN  UNUSUAL VAGINAL DISCHARGE OR ITCHING   Items with * indicate a potential emergency and should be followed up as soon as possible or go to the Emergency Department if any problems should occur.  Please show the CHEMOTHERAPY ALERT CARD or IMMUNOTHERAPY ALERT CARD at check-in to the Emergency Department and triage nurse.  Should you have questions after your visit or need to cancel or reschedule your appointment, please contact [facility] [place] - A DEPT OF [facility] [phone]  and follow the prompts.  Office hours are 8:00 a.m. to 4:30 p.m. Monday - Friday. Please note that voicemails left after 4:00 p.m. may not be returned until the following business day.  We are closed weekends and major holidays. You have access to a nurse at all times for urgent questions. Please call the main number to the clinic [phone] and follow the prompts.  For any non-urgent questions, you may also contact your provider using MyChart. We now offer e-Visits for anyone 59 and older to request care online for non-urgent symptoms. For details visit [email or web address].   Also download the MyChart app! Go to the app store, search MyChart, open the app, select [place], and log in with your MyChart username and password.

## 2024-03-29 NOTE — Progress Notes (Signed)
 Stephanie Middleton presents today for injection per the provider's orders.  Epogen  and B12  administration without incident; injection site WNL; see MAR for injection details.  Patient tolerated procedure well and without incident.  No questions or complaints noted at this time.  Discharged from clinic by wheel chair in stable condition. Alert and oriented x 3. F/U with [facility] as scheduled.

## 2024-03-31 DIAGNOSIS — D631 Anemia in chronic kidney disease: Secondary | ICD-10-CM | POA: Diagnosis not present

## 2024-03-31 DIAGNOSIS — I129 Hypertensive chronic kidney disease with stage 1 through stage 4 chronic kidney disease, or unspecified chronic kidney disease: Secondary | ICD-10-CM | POA: Diagnosis not present

## 2024-03-31 DIAGNOSIS — N185 Chronic kidney disease, stage 5: Secondary | ICD-10-CM | POA: Diagnosis not present

## 2024-03-31 DIAGNOSIS — N2581 Secondary hyperparathyroidism of renal origin: Secondary | ICD-10-CM | POA: Diagnosis not present

## 2024-04-05 ENCOUNTER — Other Ambulatory Visit (HOSPITAL_COMMUNITY)
Admission: RE | Admit: 2024-04-05 | Discharge: 2024-04-05 | Disposition: A | Discharge status: Home / Self Care | Source: Ambulatory Visit | Attending: Nephrology | Admitting: Nephrology

## 2024-04-05 DIAGNOSIS — R809 Proteinuria, unspecified: Secondary | ICD-10-CM | POA: Insufficient documentation

## 2024-04-05 DIAGNOSIS — E211 Secondary hyperparathyroidism, not elsewhere classified: Secondary | ICD-10-CM | POA: Diagnosis not present

## 2024-04-05 DIAGNOSIS — D631 Anemia in chronic kidney disease: Secondary | ICD-10-CM | POA: Insufficient documentation

## 2024-04-05 DIAGNOSIS — Z1159 Encounter for screening for other viral diseases: Secondary | ICD-10-CM | POA: Diagnosis not present

## 2024-04-05 DIAGNOSIS — N185 Chronic kidney disease, stage 5: Secondary | ICD-10-CM | POA: Insufficient documentation

## 2024-04-05 DIAGNOSIS — E559 Vitamin D deficiency, unspecified: Secondary | ICD-10-CM | POA: Diagnosis not present

## 2024-04-05 DIAGNOSIS — N189 Chronic kidney disease, unspecified: Secondary | ICD-10-CM | POA: Diagnosis present

## 2024-04-05 DIAGNOSIS — N184 Chronic kidney disease, stage 4 (severe): Secondary | ICD-10-CM | POA: Diagnosis present

## 2024-04-05 LAB — CBC
HCT: 30.2 % — ABNORMAL LOW (ref 36.0–46.0)
Hemoglobin: 9 g/dL — ABNORMAL LOW (ref 12.0–15.0)
MCH: 29.9 pg (ref 26.0–34.0)
MCHC: 29.8 g/dL — ABNORMAL LOW (ref 30.0–36.0)
MCV: 100.3 fL — ABNORMAL HIGH (ref 80.0–100.0)
Platelets: 211 10*3/uL (ref 150–400)
RBC: 3.01 MIL/uL — ABNORMAL LOW (ref 3.87–5.11)
RDW: 15.9 % — ABNORMAL HIGH (ref 11.5–15.5)
WBC: 5.8 10*3/uL (ref 4.0–10.5)
nRBC: 0 % (ref 0.0–0.2)

## 2024-04-05 LAB — RENAL FUNCTION PANEL
Albumin: 4.1 g/dL (ref 3.5–5.0)
Anion gap: 17 — ABNORMAL HIGH (ref 5–15)
BUN: 76 mg/dL — ABNORMAL HIGH (ref 8–23)
CO2: 22 mmol/L (ref 22–32)
Calcium: 9.4 mg/dL (ref 8.9–10.3)
Chloride: 99 mmol/L (ref 98–111)
Creatinine, Ser: 3.77 mg/dL — ABNORMAL HIGH (ref 0.44–1.00)
GFR, Estimated: 11 mL/min — ABNORMAL LOW (ref >60)
Glucose, Bld: 165 mg/dL — ABNORMAL HIGH (ref 70–99)
Phosphorus: 5.3 mg/dL — ABNORMAL HIGH (ref 2.5–4.6)
Potassium: 4.6 mmol/L (ref 3.5–5.1)
Sodium: 138 mmol/L (ref 135–145)

## 2024-04-05 LAB — PROTEIN / CREATININE RATIO, URINE
Creatinine, Urine: 92 mg/dL
Protein Creatinine Ratio: 0.6 mg/mg{creat} — ABNORMAL HIGH (ref 0.00–0.15)
Total Protein, Urine: 55 mg/dL

## 2024-04-05 LAB — HEPATITIS C ANTIBODY: HCV Ab: NONREACTIVE

## 2024-04-05 LAB — HEPATITIS B SURFACE ANTIGEN: Hepatitis B Surface Ag: NONREACTIVE

## 2024-04-05 LAB — VITAMIN D 25 HYDROXY (VIT D DEFICIENCY, FRACTURES): Vit D, 25-Hydroxy: 69.36 ng/mL (ref 30–100)

## 2024-04-05 LAB — HEPATITIS B SURFACE ANTIBODY,QUALITATIVE: Hep B S Ab: NONREACTIVE

## 2024-04-06 LAB — HCV AB W REFLEX TO QUANT PCR: HCV Ab: NONREACTIVE

## 2024-04-06 LAB — HEPATITIS B CORE ANTIBODY, TOTAL: HEP B CORE AB: NEGATIVE

## 2024-04-06 LAB — HCV INTERPRETATION

## 2024-04-10 LAB — QUANTIFERON-TB GOLD PLUS (RQFGPL)
QuantiFERON Mitogen Value: >10 [IU]/mL
QuantiFERON Nil Value: 0.13 [IU]/mL
QuantiFERON TB1 Ag Value: 0.07 [IU]/mL
QuantiFERON TB2 Ag Value: 0.1 [IU]/mL

## 2024-04-10 LAB — QUANTIFERON-TB GOLD PLUS: QuantiFERON-TB Gold Plus: NEGATIVE

## 2024-04-11 ENCOUNTER — Other Ambulatory Visit: Payer: Self-pay

## 2024-04-11 DIAGNOSIS — D631 Anemia in chronic kidney disease: Secondary | ICD-10-CM

## 2024-04-12 ENCOUNTER — Inpatient Hospital Stay

## 2024-04-12 ENCOUNTER — Inpatient Hospital Stay: Attending: Hematology

## 2024-04-12 VITALS — BP 129/58 | HR 60 | Resp 16

## 2024-04-12 DIAGNOSIS — N185 Chronic kidney disease, stage 5: Secondary | ICD-10-CM | POA: Insufficient documentation

## 2024-04-12 DIAGNOSIS — I129 Hypertensive chronic kidney disease with stage 1 through stage 4 chronic kidney disease, or unspecified chronic kidney disease: Secondary | ICD-10-CM | POA: Diagnosis not present

## 2024-04-12 DIAGNOSIS — Z79899 Other long term (current) drug therapy: Secondary | ICD-10-CM | POA: Diagnosis not present

## 2024-04-12 DIAGNOSIS — D631 Anemia in chronic kidney disease: Secondary | ICD-10-CM | POA: Insufficient documentation

## 2024-04-12 LAB — CBC
HCT: 29.8 % — ABNORMAL LOW (ref 36.0–46.0)
Hemoglobin: 8.9 g/dL — ABNORMAL LOW (ref 12.0–15.0)
MCH: 30 pg (ref 26.0–34.0)
MCHC: 29.9 g/dL — ABNORMAL LOW (ref 30.0–36.0)
MCV: 100.3 fL — ABNORMAL HIGH (ref 80.0–100.0)
Platelets: 188 K/uL (ref 150–400)
RBC: 2.97 MIL/uL — ABNORMAL LOW (ref 3.87–5.11)
RDW: 15.6 % — ABNORMAL HIGH (ref 11.5–15.5)
WBC: 5.4 K/uL (ref 4.0–10.5)
nRBC: 0 % (ref 0.0–0.2)

## 2024-04-12 MED ORDER — EPOETIN ALFA 20000 UNIT/ML IJ SOLN
20000.0000 [IU] | Freq: Once | INTRAMUSCULAR | Status: AC
  Administered 2024-04-12: 20000 [IU] via SUBCUTANEOUS
  Filled 2024-04-12: qty 1

## 2024-04-12 NOTE — Progress Notes (Signed)
 Patient's Hgb 8.9 and blood pressure stable. Patient tolerated Retacrit  injection with no complaints voiced.  Site clean and dry with no bruising or swelling noted at site.  See MAR for details.  Band aid applied.  Patient stable during and after injection.  Vss with discharge and left in satisfactory condition with no s/s of distress noted. All follow ups as scheduled.  Glennie Bose

## 2024-04-12 NOTE — Progress Notes (Signed)
 Orders received to increase dose of Procrit  to 20,000 units q 2 weeks.  V.O. Delon Hope, NP/Tyteanna Ost Joshua, PharmD

## 2024-04-12 NOTE — Patient Instructions (Signed)

## 2024-04-24 DIAGNOSIS — D508 Other iron deficiency anemias: Secondary | ICD-10-CM | POA: Diagnosis not present

## 2024-04-24 DIAGNOSIS — D510 Vitamin B12 deficiency anemia due to intrinsic factor deficiency: Secondary | ICD-10-CM | POA: Diagnosis not present

## 2024-04-24 DIAGNOSIS — N185 Chronic kidney disease, stage 5: Secondary | ICD-10-CM | POA: Diagnosis not present

## 2024-04-24 DIAGNOSIS — D631 Anemia in chronic kidney disease: Secondary | ICD-10-CM | POA: Diagnosis not present

## 2024-04-24 DIAGNOSIS — I129 Hypertensive chronic kidney disease with stage 1 through stage 4 chronic kidney disease, or unspecified chronic kidney disease: Secondary | ICD-10-CM | POA: Diagnosis not present

## 2024-04-24 DIAGNOSIS — M150 Primary generalized (osteo)arthritis: Secondary | ICD-10-CM | POA: Diagnosis not present

## 2024-04-24 DIAGNOSIS — E1122 Type 2 diabetes mellitus with diabetic chronic kidney disease: Secondary | ICD-10-CM | POA: Diagnosis not present

## 2024-04-25 DIAGNOSIS — N2581 Secondary hyperparathyroidism of renal origin: Secondary | ICD-10-CM | POA: Diagnosis not present

## 2024-04-25 DIAGNOSIS — N179 Acute kidney failure, unspecified: Secondary | ICD-10-CM | POA: Diagnosis not present

## 2024-04-25 DIAGNOSIS — D631 Anemia in chronic kidney disease: Secondary | ICD-10-CM | POA: Diagnosis not present

## 2024-04-25 DIAGNOSIS — N185 Chronic kidney disease, stage 5: Secondary | ICD-10-CM | POA: Diagnosis not present

## 2024-04-26 ENCOUNTER — Inpatient Hospital Stay

## 2024-04-26 ENCOUNTER — Ambulatory Visit: Payer: Self-pay | Admitting: Oncology

## 2024-04-26 VITALS — BP 154/79 | HR 64 | Temp 98.2°F | Resp 18

## 2024-04-26 DIAGNOSIS — N185 Chronic kidney disease, stage 5: Secondary | ICD-10-CM | POA: Diagnosis not present

## 2024-04-26 DIAGNOSIS — D631 Anemia in chronic kidney disease: Secondary | ICD-10-CM

## 2024-04-26 DIAGNOSIS — Z79899 Other long term (current) drug therapy: Secondary | ICD-10-CM | POA: Diagnosis not present

## 2024-04-26 DIAGNOSIS — I129 Hypertensive chronic kidney disease with stage 1 through stage 4 chronic kidney disease, or unspecified chronic kidney disease: Secondary | ICD-10-CM | POA: Diagnosis not present

## 2024-04-26 LAB — COMPREHENSIVE METABOLIC PANEL WITH GFR
ALT: 17 U/L (ref 0–44)
AST: 17 U/L (ref 15–41)
Albumin: 4.2 g/dL (ref 3.5–5.0)
Alkaline Phosphatase: 63 U/L (ref 38–126)
Anion gap: 10 (ref 5–15)
BUN: 61 mg/dL — ABNORMAL HIGH (ref 8–23)
CO2: 23 mmol/L (ref 22–32)
Calcium: 9.7 mg/dL (ref 8.9–10.3)
Chloride: 106 mmol/L (ref 98–111)
Creatinine, Ser: 3.43 mg/dL — ABNORMAL HIGH (ref 0.44–1.00)
GFR, Estimated: 12 mL/min — ABNORMAL LOW (ref >60)
Glucose, Bld: 173 mg/dL — ABNORMAL HIGH (ref 70–99)
Potassium: 4.3 mmol/L (ref 3.5–5.1)
Sodium: 139 mmol/L (ref 135–145)
Total Bilirubin: 0.7 mg/dL (ref 0.0–1.2)
Total Protein: 7.2 g/dL (ref 6.5–8.1)

## 2024-04-26 LAB — CBC WITH DIFFERENTIAL/PLATELET
Abs Immature Granulocytes: 0.02 K/uL (ref 0.00–0.07)
Basophils Absolute: 0 K/uL (ref 0.0–0.1)
Basophils Relative: 1 %
Eosinophils Absolute: 0.2 K/uL (ref 0.0–0.5)
Eosinophils Relative: 3 %
HCT: 30.1 % — ABNORMAL LOW (ref 36.0–46.0)
Hemoglobin: 9.1 g/dL — ABNORMAL LOW (ref 12.0–15.0)
Immature Granulocytes: 0 %
Lymphocytes Relative: 19 %
Lymphs Abs: 0.9 K/uL (ref 0.7–4.0)
MCH: 30.5 pg (ref 26.0–34.0)
MCHC: 30.2 g/dL (ref 30.0–36.0)
MCV: 101 fL — ABNORMAL HIGH (ref 80.0–100.0)
Monocytes Absolute: 0.4 K/uL (ref 0.1–1.0)
Monocytes Relative: 8 %
Neutro Abs: 3.3 K/uL (ref 1.7–7.7)
Neutrophils Relative %: 69 %
Platelets: 192 K/uL (ref 150–400)
RBC: 2.98 MIL/uL — ABNORMAL LOW (ref 3.87–5.11)
RDW: 15.9 % — ABNORMAL HIGH (ref 11.5–15.5)
WBC: 4.9 K/uL (ref 4.0–10.5)
nRBC: 0 % (ref 0.0–0.2)

## 2024-04-26 LAB — VITAMIN B12: Vitamin B-12: 602 pg/mL (ref 180–914)

## 2024-04-26 LAB — IRON AND TIBC
Iron: 71 ug/dL (ref 28–170)
Saturation Ratios: 26 % (ref 10.4–31.8)
TIBC: 274 ug/dL (ref 250–450)
UIBC: 203 ug/dL

## 2024-04-26 LAB — FERRITIN: Ferritin: 913 ng/mL — ABNORMAL HIGH (ref 11–307)

## 2024-04-26 MED ORDER — CYANOCOBALAMIN 1000 MCG/ML IJ SOLN
1000.0000 ug | Freq: Once | INTRAMUSCULAR | Status: AC
  Administered 2024-04-26: 1000 ug via INTRAMUSCULAR
  Filled 2024-04-26: qty 1

## 2024-04-26 MED ORDER — EPOETIN ALFA 10000 UNIT/ML IJ SOLN
20000.0000 [IU] | Freq: Once | INTRAMUSCULAR | Status: AC
  Administered 2024-04-26: 20000 [IU] via SUBCUTANEOUS
  Filled 2024-04-26: qty 2

## 2024-04-26 NOTE — Patient Instructions (Signed)
 [facility] [place] - A DEPT OF [facility]  Discharge Instructions: Thank you for choosing [facility] to provide your oncology and hematology care.  If you have a lab appointment with the Cancer Center - please note that after April 8th, 2024, all labs will be drawn in the cancer center.  You do not have to check in or register with the main entrance as you have in the past but will complete your check-in in the cancer center.  Wear comfortable clothing and clothing appropriate for easy access to any Portacath or PICC line.   We strive to give you quality time with your provider. You may need to reschedule your appointment if you arrive late (15 or more minutes).  Arriving late affects you and other patients whose appointments are after yours.  Also, if you miss three or more appointments without notifying the office, you may be dismissed from the clinic at the provider's discretion.      For prescription refill requests, have your pharmacy contact our office and allow 72 hours for refills to be completed.    Today you received the following Retacrit  and B12 return as scheduled.   To help prevent nausea and vomiting after your treatment, we encourage you to take your nausea medication as directed.  BELOW ARE SYMPTOMS THAT SHOULD BE REPORTED IMMEDIATELY: *FEVER GREATER THAN 100.4 F (38 C) OR HIGHER *CHILLS OR SWEATING *NAUSEA AND VOMITING THAT IS NOT CONTROLLED WITH YOUR NAUSEA MEDICATION *UNUSUAL SHORTNESS OF BREATH *UNUSUAL BRUISING OR BLEEDING *URINARY PROBLEMS (pain or burning when urinating, or frequent urination) *BOWEL PROBLEMS (unusual diarrhea, constipation, pain near the anus) TENDERNESS IN MOUTH AND THROAT WITH OR WITHOUT PRESENCE OF ULCERS (sore throat, sores in mouth, or a toothache) UNUSUAL RASH, SWELLING OR PAIN  UNUSUAL VAGINAL DISCHARGE OR ITCHING   Items with * indicate a potential emergency and should be followed up as soon as  possible or go to the Emergency Department if any problems should occur.  Please show the CHEMOTHERAPY ALERT CARD or IMMUNOTHERAPY ALERT CARD at check-in to the Emergency Department and triage nurse.  Should you have questions after your visit or need to cancel or reschedule your appointment, please contact [facility] [place] - A DEPT OF [facility] [phone]  and follow the prompts.  Office hours are 8:00 a.m. to 4:30 p.m. Monday - Friday. Please note that voicemails left after 4:00 p.m. may not be returned until the following business day.  We are closed weekends and major holidays. You have access to a nurse at all times for urgent questions. Please call the main number to the clinic [phone] and follow the prompts.  For any non-urgent questions, you may also contact your provider using MyChart. We now offer e-Visits for anyone 59 and older to request care online for non-urgent symptoms. For details visit [email or web address].   Also download the MyChart app! Go to the app store, search MyChart, open the app, select [place], and log in with your MyChart username and password.

## 2024-04-26 NOTE — Progress Notes (Signed)
Patient presents today for Retacrit and B12  injection. Hemoglobin reviewed prior to administration. VSS tolerated without incident or complaint. See MAR for details. Patient stable during and after injection. Patient discharged in satisfactory condition with no s/s of distress noted.  

## 2024-04-26 NOTE — Progress Notes (Signed)
 Hey Dr. Davonna,  Can you take a look at her labs?  Her ferritin continues to go up.  She last received IV iron  for iron  deficiency back in January.  She is currently not taking iron  supplements.  Should I get an ultrasound of her liver or add on labs for hemochromatosis?  Delon Hope, NP 04/26/2024 3:41 PM

## 2024-04-27 NOTE — Progress Notes (Signed)
 Thank you so much!  Delon Hope, NP 04/27/2024 3:54 PM

## 2024-04-29 LAB — METHYLMALONIC ACID, SERUM: Methylmalonic Acid, Quantitative: 376 nmol/L (ref 0–378)

## 2024-05-02 ENCOUNTER — Telehealth: Payer: Self-pay | Admitting: Oncology

## 2024-05-02 ENCOUNTER — Encounter: Payer: Self-pay | Admitting: Hematology

## 2024-05-02 ENCOUNTER — Other Ambulatory Visit: Payer: Self-pay | Admitting: Oncology

## 2024-05-02 DIAGNOSIS — N189 Chronic kidney disease, unspecified: Secondary | ICD-10-CM

## 2024-05-02 NOTE — Telephone Encounter (Signed)
 Re: Elevated Ferritin  Her increased ferritin is likely from her chronic kidney disease.  I would not give her any more IV iron  unless ferritin comes down below 400.  Her recent multiple myeloma workup was negative and currently she is on Retacrit .  I would continue her Retacrit  for her anemia and in 3 months if there is no improvement, will consider increasing her dose of Retacrit .  Most recent ferritin is 913.  Creatinine 3.43.  Hemoglobin 9.1.  Last dose of Retacrit  20,000 units was given last week.  Reevaluate at next visit regarding increasing of her Retacrit  based on hemoglobin and ferritin levels.  Delon Hope, NP 05/02/2024 2:02 PM

## 2024-05-08 ENCOUNTER — Other Ambulatory Visit: Payer: Self-pay

## 2024-05-08 DIAGNOSIS — D631 Anemia in chronic kidney disease: Secondary | ICD-10-CM

## 2024-05-09 ENCOUNTER — Other Ambulatory Visit (HOSPITAL_COMMUNITY)
Admission: RE | Admit: 2024-05-09 | Discharge: 2024-05-09 | Disposition: A | Discharge status: Home / Self Care | Source: Ambulatory Visit | Attending: Nephrology | Admitting: Nephrology

## 2024-05-09 DIAGNOSIS — N189 Chronic kidney disease, unspecified: Secondary | ICD-10-CM | POA: Insufficient documentation

## 2024-05-09 DIAGNOSIS — R809 Proteinuria, unspecified: Secondary | ICD-10-CM | POA: Insufficient documentation

## 2024-05-09 DIAGNOSIS — E211 Secondary hyperparathyroidism, not elsewhere classified: Secondary | ICD-10-CM | POA: Diagnosis not present

## 2024-05-09 DIAGNOSIS — D631 Anemia in chronic kidney disease: Secondary | ICD-10-CM | POA: Diagnosis not present

## 2024-05-09 LAB — RENAL FUNCTION PANEL
Albumin: 4.2 g/dL (ref 3.5–5.0)
Anion gap: 15 (ref 5–15)
BUN: 72 mg/dL — ABNORMAL HIGH (ref 8–23)
CO2: 23 mmol/L (ref 22–32)
Calcium: 9.3 mg/dL (ref 8.9–10.3)
Chloride: 100 mmol/L (ref 98–111)
Creatinine, Ser: 4.04 mg/dL — ABNORMAL HIGH (ref 0.44–1.00)
GFR, Estimated: 10 mL/min — ABNORMAL LOW (ref >60)
Glucose, Bld: 209 mg/dL — ABNORMAL HIGH (ref 70–99)
Phosphorus: 5 mg/dL — ABNORMAL HIGH (ref 2.5–4.6)
Potassium: 4.3 mmol/L (ref 3.5–5.1)
Sodium: 138 mmol/L (ref 135–145)

## 2024-05-09 LAB — CBC
HCT: 30.4 % — ABNORMAL LOW (ref 36.0–46.0)
Hemoglobin: 9.3 g/dL — ABNORMAL LOW (ref 12.0–15.0)
MCH: 30.6 pg (ref 26.0–34.0)
MCHC: 30.6 g/dL (ref 30.0–36.0)
MCV: 100 fL (ref 80.0–100.0)
Platelets: 214 K/uL (ref 150–400)
RBC: 3.04 MIL/uL — ABNORMAL LOW (ref 3.87–5.11)
RDW: 16.2 % — ABNORMAL HIGH (ref 11.5–15.5)
WBC: 4.4 K/uL (ref 4.0–10.5)
nRBC: 0 % (ref 0.0–0.2)

## 2024-05-09 LAB — FERRITIN: Ferritin: 869 ng/mL — ABNORMAL HIGH (ref 11–307)

## 2024-05-09 LAB — IRON AND TIBC
Iron: 55 ug/dL (ref 28–170)
Saturation Ratios: 20 % (ref 10.4–31.8)
TIBC: 282 ug/dL (ref 250–450)
UIBC: 227 ug/dL

## 2024-05-09 LAB — PROTEIN / CREATININE RATIO, URINE
Creatinine, Urine: 85 mg/dL
Protein Creatinine Ratio: 0.76 mg/mg{creat} — ABNORMAL HIGH (ref 0.00–0.15)
Total Protein, Urine: 65 mg/dL

## 2024-05-10 ENCOUNTER — Inpatient Hospital Stay: Attending: Hematology

## 2024-05-10 ENCOUNTER — Inpatient Hospital Stay

## 2024-05-10 VITALS — BP 123/61 | HR 58 | Temp 98.1°F | Resp 18

## 2024-05-10 DIAGNOSIS — Z79899 Other long term (current) drug therapy: Secondary | ICD-10-CM | POA: Diagnosis not present

## 2024-05-10 DIAGNOSIS — E8770 Fluid overload, unspecified: Secondary | ICD-10-CM | POA: Diagnosis not present

## 2024-05-10 DIAGNOSIS — I129 Hypertensive chronic kidney disease with stage 1 through stage 4 chronic kidney disease, or unspecified chronic kidney disease: Secondary | ICD-10-CM | POA: Diagnosis not present

## 2024-05-10 DIAGNOSIS — N185 Chronic kidney disease, stage 5: Secondary | ICD-10-CM | POA: Diagnosis not present

## 2024-05-10 DIAGNOSIS — I120 Hypertensive chronic kidney disease with stage 5 chronic kidney disease or end stage renal disease: Secondary | ICD-10-CM | POA: Diagnosis not present

## 2024-05-10 DIAGNOSIS — D631 Anemia in chronic kidney disease: Secondary | ICD-10-CM | POA: Insufficient documentation

## 2024-05-10 DIAGNOSIS — E538 Deficiency of other specified B group vitamins: Secondary | ICD-10-CM | POA: Insufficient documentation

## 2024-05-10 LAB — CBC
HCT: 29.8 % — ABNORMAL LOW (ref 36.0–46.0)
Hemoglobin: 9.3 g/dL — ABNORMAL LOW (ref 12.0–15.0)
MCH: 31.1 pg (ref 26.0–34.0)
MCHC: 31.2 g/dL (ref 30.0–36.0)
MCV: 99.7 fL (ref 80.0–100.0)
Platelets: 209 K/uL (ref 150–400)
RBC: 2.99 MIL/uL — ABNORMAL LOW (ref 3.87–5.11)
RDW: 15.9 % — ABNORMAL HIGH (ref 11.5–15.5)
WBC: 4.9 K/uL (ref 4.0–10.5)
nRBC: 0 % (ref 0.0–0.2)

## 2024-05-10 LAB — PARATHYROID HORMONE, INTACT (NO CA): PTH: 214 pg/mL — ABNORMAL HIGH (ref 15–65)

## 2024-05-10 MED ORDER — EPOETIN ALFA 20000 UNIT/ML IJ SOLN
20000.0000 [IU] | Freq: Once | INTRAMUSCULAR | Status: AC
  Administered 2024-05-10: 20000 [IU] via SUBCUTANEOUS
  Filled 2024-05-10: qty 1

## 2024-05-10 NOTE — Progress Notes (Signed)
 Patient tolerated injection with no complaints voiced.  Site clean and dry with no bruising or swelling noted at site.  See MAR for details.  Band aid applied.  Patient stable during and after injection.  Vss with discharge and left in satisfactory condition with no s/s of distress noted.

## 2024-05-10 NOTE — Patient Instructions (Signed)
 [facility] [place] - A DEPT OF [facility]  Discharge Instructions: Thank you for choosing [facility] to provide your oncology and hematology care.  If you have a lab appointment with the Cancer Center - please note that after April 8th, 2024, all labs will be drawn in the cancer center.  You do not have to check in or register with the main entrance as you have in the past but will complete your check-in in the cancer center.  Wear comfortable clothing and clothing appropriate for easy access to any Portacath or PICC line.   We strive to give you quality time with your provider. You may need to reschedule your appointment if you arrive late (15 or more minutes).  Arriving late affects you and other patients whose appointments are after yours.  Also, if you miss three or more appointments without notifying the office, you may be dismissed from the clinic at the provider's discretion.      For prescription refill requests, have your pharmacy contact our office and allow 72 hours for refills to be completed.    Today you received the following chemotherapy and/or immunotherapy agents epogen      To help prevent nausea and vomiting after your treatment, we encourage you to take your nausea medication as directed.  BELOW ARE SYMPTOMS THAT SHOULD BE REPORTED IMMEDIATELY: *FEVER GREATER THAN 100.4 F (38 C) OR HIGHER *CHILLS OR SWEATING *NAUSEA AND VOMITING THAT IS NOT CONTROLLED WITH YOUR NAUSEA MEDICATION *UNUSUAL SHORTNESS OF BREATH *UNUSUAL BRUISING OR BLEEDING *URINARY PROBLEMS (pain or burning when urinating, or frequent urination) *BOWEL PROBLEMS (unusual diarrhea, constipation, pain near the anus) TENDERNESS IN MOUTH AND THROAT WITH OR WITHOUT PRESENCE OF ULCERS (sore throat, sores in mouth, or a toothache) UNUSUAL RASH, SWELLING OR PAIN  UNUSUAL VAGINAL DISCHARGE OR ITCHING   Items with * indicate a potential emergency and should be followed up as  soon as possible or go to the Emergency Department if any problems should occur.  Please show the CHEMOTHERAPY ALERT CARD or IMMUNOTHERAPY ALERT CARD at check-in to the Emergency Department and triage nurse.  Should you have questions after your visit or need to cancel or reschedule your appointment, please contact [facility] [place] - A DEPT OF [facility] [phone]  and follow the prompts.  Office hours are 8:00 a.m. to 4:30 p.m. Monday - Friday. Please note that voicemails left after 4:00 p.m. may not be returned until the following business day.  We are closed weekends and major holidays. You have access to a nurse at all times for urgent questions. Please call the main number to the clinic [phone] and follow the prompts.  For any non-urgent questions, you may also contact your provider using MyChart. We now offer e-Visits for anyone 61 and older to request care online for non-urgent symptoms. For details visit [email or web address].   Also download the MyChart app! Go to the app store, search "MyChart", open the app, select [place], and log in with your MyChart username and password.

## 2024-05-23 ENCOUNTER — Other Ambulatory Visit: Payer: Self-pay

## 2024-05-23 DIAGNOSIS — D631 Anemia in chronic kidney disease: Secondary | ICD-10-CM

## 2024-05-24 ENCOUNTER — Inpatient Hospital Stay

## 2024-05-24 VITALS — BP 148/69 | HR 62 | Temp 98.6°F | Resp 18

## 2024-05-24 DIAGNOSIS — E538 Deficiency of other specified B group vitamins: Secondary | ICD-10-CM | POA: Diagnosis not present

## 2024-05-24 DIAGNOSIS — D631 Anemia in chronic kidney disease: Secondary | ICD-10-CM

## 2024-05-24 DIAGNOSIS — Z79899 Other long term (current) drug therapy: Secondary | ICD-10-CM | POA: Diagnosis not present

## 2024-05-24 DIAGNOSIS — N185 Chronic kidney disease, stage 5: Secondary | ICD-10-CM | POA: Diagnosis not present

## 2024-05-24 DIAGNOSIS — I120 Hypertensive chronic kidney disease with stage 5 chronic kidney disease or end stage renal disease: Secondary | ICD-10-CM | POA: Diagnosis not present

## 2024-05-24 LAB — CBC
HCT: 31.2 % — ABNORMAL LOW (ref 36.0–46.0)
Hemoglobin: 9.5 g/dL — ABNORMAL LOW (ref 12.0–15.0)
MCH: 30.2 pg (ref 26.0–34.0)
MCHC: 30.4 g/dL (ref 30.0–36.0)
MCV: 99 fL (ref 80.0–100.0)
Platelets: 213 K/uL (ref 150–400)
RBC: 3.15 MIL/uL — ABNORMAL LOW (ref 3.87–5.11)
RDW: 16.4 % — ABNORMAL HIGH (ref 11.5–15.5)
WBC: 4.9 K/uL (ref 4.0–10.5)
nRBC: 0 % (ref 0.0–0.2)

## 2024-05-24 MED ORDER — EPOETIN ALFA 20000 UNIT/ML IJ SOLN
20000.0000 [IU] | Freq: Once | INTRAMUSCULAR | Status: AC
  Administered 2024-05-24: 20000 [IU] via SUBCUTANEOUS
  Filled 2024-05-24: qty 1

## 2024-05-24 MED ORDER — CYANOCOBALAMIN 1000 MCG/ML IJ SOLN
1000.0000 ug | Freq: Once | INTRAMUSCULAR | Status: AC
  Administered 2024-05-24: 1000 ug via INTRAMUSCULAR
  Filled 2024-05-24: qty 1

## 2024-05-24 NOTE — Progress Notes (Signed)
 Stephanie Middleton presents today for injection per the provider's orders. Epogen  20,000 and B12 administration without incident; injection site WNL; see MAR for injection details.  Patient tolerated procedure well and without incident.  No questions or complaints noted at this time.   Discharged from clinic via wheelchair in stable condition. Alert and oriented x 3. F/U with [facility] as scheduled.

## 2024-05-24 NOTE — Patient Instructions (Signed)
 [facility] [place] - A DEPT OF [facility]  Discharge Instructions: Thank you for choosing [facility] to provide your oncology and hematology care.  If you have a lab appointment with the Cancer Center - please note that after April 8th, 2024, all labs will be drawn in the cancer center.  You do not have to check in or register with the main entrance as you have in the past but will complete your check-in in the cancer center.  Wear comfortable clothing and clothing appropriate for easy access to any Portacath or PICC line.   We strive to give you quality time with your provider. You may need to reschedule your appointment if you arrive late (15 or more minutes).  Arriving late affects you and other patients whose appointments are after yours.  Also, if you miss three or more appointments without notifying the office, you may be dismissed from the clinic at the provider's discretion.      For prescription refill requests, have your pharmacy contact our office and allow 72 hours for refills to be completed.    Today you received B12 injection and Procrit  20,000       BELOW ARE SYMPTOMS THAT SHOULD BE REPORTED IMMEDIATELY: *FEVER GREATER THAN 100.4 F (38 C) OR HIGHER *CHILLS OR SWEATING *NAUSEA AND VOMITING THAT IS NOT CONTROLLED WITH YOUR NAUSEA MEDICATION *UNUSUAL SHORTNESS OF BREATH *UNUSUAL BRUISING OR BLEEDING *URINARY PROBLEMS (pain or burning when urinating, or frequent urination) *BOWEL PROBLEMS (unusual diarrhea, constipation, pain near the anus) TENDERNESS IN MOUTH AND THROAT WITH OR WITHOUT PRESENCE OF ULCERS (sore throat, sores in mouth, or a toothache) UNUSUAL RASH, SWELLING OR PAIN  UNUSUAL VAGINAL DISCHARGE OR ITCHING   Items with * indicate a potential emergency and should be followed up as soon as possible or go to the Emergency Department if any problems should occur.  Please show the CHEMOTHERAPY ALERT CARD or IMMUNOTHERAPY ALERT  CARD at check-in to the Emergency Department and triage nurse.  Should you have questions after your visit or need to cancel or reschedule your appointment, please contact [facility] [place] - A DEPT OF [facility] [phone]  and follow the prompts.  Office hours are 8:00 a.m. to 4:30 p.m. Monday - Friday. Please note that voicemails left after 4:00 p.m. may not be returned until the following business day.  We are closed weekends and major holidays. You have access to a nurse at all times for urgent questions. Please call the main number to the clinic [phone] and follow the prompts.  For any non-urgent questions, you may also contact your provider using MyChart. We now offer e-Visits for anyone 80 and older to request care online for non-urgent symptoms. For details visit [email or web address].   Also download the MyChart app! Go to the app store, search MyChart, open the app, select [place], and log in with your MyChart username and password.

## 2024-05-31 ENCOUNTER — Other Ambulatory Visit (HOSPITAL_COMMUNITY)
Admission: RE | Admit: 2024-05-31 | Discharge: 2024-05-31 | Disposition: A | Discharge status: Home / Self Care | Source: Ambulatory Visit | Attending: Nephrology | Admitting: Nephrology

## 2024-05-31 DIAGNOSIS — N189 Chronic kidney disease, unspecified: Secondary | ICD-10-CM | POA: Insufficient documentation

## 2024-05-31 LAB — CBC
HCT: 32.2 % — ABNORMAL LOW (ref 36.0–46.0)
Hemoglobin: 10.1 g/dL — ABNORMAL LOW (ref 12.0–15.0)
MCH: 31 pg (ref 26.0–34.0)
MCHC: 31.4 g/dL (ref 30.0–36.0)
MCV: 98.8 fL (ref 80.0–100.0)
Platelets: 237 K/uL (ref 150–400)
RBC: 3.26 MIL/uL — ABNORMAL LOW (ref 3.87–5.11)
RDW: 16.6 % — ABNORMAL HIGH (ref 11.5–15.5)
WBC: 4.7 K/uL (ref 4.0–10.5)
nRBC: 0 % (ref 0.0–0.2)

## 2024-05-31 LAB — PROTEIN / CREATININE RATIO, URINE
Creatinine, Urine: 77 mg/dL
Protein Creatinine Ratio: 0.43 mg/mg{creat} — ABNORMAL HIGH (ref 0.00–0.15)
Total Protein, Urine: 33 mg/dL

## 2024-05-31 LAB — RENAL FUNCTION PANEL
Albumin: 4.3 g/dL (ref 3.5–5.0)
Anion gap: 14 (ref 5–15)
BUN: 72 mg/dL — ABNORMAL HIGH (ref 8–23)
CO2: 25 mmol/L (ref 22–32)
Calcium: 9.1 mg/dL (ref 8.9–10.3)
Chloride: 100 mmol/L (ref 98–111)
Creatinine, Ser: 4.58 mg/dL — ABNORMAL HIGH (ref 0.44–1.00)
GFR, Estimated: 9 mL/min — ABNORMAL LOW (ref >60)
Glucose, Bld: 132 mg/dL — ABNORMAL HIGH (ref 70–99)
Phosphorus: 5.3 mg/dL — ABNORMAL HIGH (ref 2.5–4.6)
Potassium: 4.2 mmol/L (ref 3.5–5.1)
Sodium: 139 mmol/L (ref 135–145)

## 2024-05-31 LAB — IRON AND TIBC
Iron: 43 ug/dL (ref 28–170)
Saturation Ratios: 15 % (ref 10.4–31.8)
TIBC: 283 ug/dL (ref 250–450)
UIBC: 240 ug/dL

## 2024-05-31 LAB — FERRITIN: Ferritin: 771 ng/mL — ABNORMAL HIGH (ref 11–307)

## 2024-06-01 LAB — PARATHYROID HORMONE, INTACT (NO CA): PTH: 219 pg/mL — ABNORMAL HIGH (ref 15–65)

## 2024-06-05 DIAGNOSIS — N186 End stage renal disease: Secondary | ICD-10-CM | POA: Diagnosis not present

## 2024-06-05 DIAGNOSIS — N2581 Secondary hyperparathyroidism of renal origin: Secondary | ICD-10-CM | POA: Diagnosis not present

## 2024-06-05 DIAGNOSIS — D631 Anemia in chronic kidney disease: Secondary | ICD-10-CM | POA: Diagnosis not present

## 2024-06-05 DIAGNOSIS — N185 Chronic kidney disease, stage 5: Secondary | ICD-10-CM | POA: Diagnosis not present

## 2024-06-05 DIAGNOSIS — I129 Hypertensive chronic kidney disease with stage 1 through stage 4 chronic kidney disease, or unspecified chronic kidney disease: Secondary | ICD-10-CM | POA: Diagnosis not present

## 2024-06-05 DIAGNOSIS — Z992 Dependence on renal dialysis: Secondary | ICD-10-CM | POA: Diagnosis not present

## 2024-06-08 ENCOUNTER — Inpatient Hospital Stay

## 2024-06-08 ENCOUNTER — Inpatient Hospital Stay: Attending: Oncology | Admitting: Oncology

## 2024-06-08 VITALS — BP 135/70 | HR 62 | Temp 98.1°F | Resp 18

## 2024-06-08 DIAGNOSIS — D631 Anemia in chronic kidney disease: Secondary | ICD-10-CM | POA: Diagnosis not present

## 2024-06-08 DIAGNOSIS — I120 Hypertensive chronic kidney disease with stage 5 chronic kidney disease or end stage renal disease: Secondary | ICD-10-CM | POA: Insufficient documentation

## 2024-06-08 DIAGNOSIS — R5383 Other fatigue: Secondary | ICD-10-CM | POA: Diagnosis not present

## 2024-06-08 DIAGNOSIS — M531 Cervicobrachial syndrome: Secondary | ICD-10-CM | POA: Insufficient documentation

## 2024-06-08 DIAGNOSIS — R200 Anesthesia of skin: Secondary | ICD-10-CM | POA: Insufficient documentation

## 2024-06-08 DIAGNOSIS — R531 Weakness: Secondary | ICD-10-CM | POA: Insufficient documentation

## 2024-06-08 DIAGNOSIS — K5900 Constipation, unspecified: Secondary | ICD-10-CM | POA: Insufficient documentation

## 2024-06-08 DIAGNOSIS — N185 Chronic kidney disease, stage 5: Secondary | ICD-10-CM | POA: Diagnosis not present

## 2024-06-08 DIAGNOSIS — R7989 Other specified abnormal findings of blood chemistry: Secondary | ICD-10-CM | POA: Insufficient documentation

## 2024-06-08 DIAGNOSIS — M25551 Pain in right hip: Secondary | ICD-10-CM | POA: Diagnosis not present

## 2024-06-08 DIAGNOSIS — N189 Chronic kidney disease, unspecified: Secondary | ICD-10-CM

## 2024-06-08 DIAGNOSIS — Z79899 Other long term (current) drug therapy: Secondary | ICD-10-CM | POA: Diagnosis not present

## 2024-06-08 DIAGNOSIS — E538 Deficiency of other specified B group vitamins: Secondary | ICD-10-CM | POA: Diagnosis not present

## 2024-06-08 LAB — CBC WITH DIFFERENTIAL/PLATELET
Abs Immature Granulocytes: 0.02 K/uL (ref 0.00–0.07)
Basophils Absolute: 0 K/uL (ref 0.0–0.1)
Basophils Relative: 1 %
Eosinophils Absolute: 0.1 K/uL (ref 0.0–0.5)
Eosinophils Relative: 3 %
HCT: 31.3 % — ABNORMAL LOW (ref 36.0–46.0)
Hemoglobin: 9.5 g/dL — ABNORMAL LOW (ref 12.0–15.0)
Immature Granulocytes: 0 %
Lymphocytes Relative: 20 %
Lymphs Abs: 0.9 K/uL (ref 0.7–4.0)
MCH: 30.2 pg (ref 26.0–34.0)
MCHC: 30.4 g/dL (ref 30.0–36.0)
MCV: 99.4 fL (ref 80.0–100.0)
Monocytes Absolute: 0.4 K/uL (ref 0.1–1.0)
Monocytes Relative: 9 %
Neutro Abs: 3 K/uL (ref 1.7–7.7)
Neutrophils Relative %: 67 %
Platelets: 215 K/uL (ref 150–400)
RBC: 3.15 MIL/uL — ABNORMAL LOW (ref 3.87–5.11)
RDW: 16.1 % — ABNORMAL HIGH (ref 11.5–15.5)
WBC: 4.6 K/uL (ref 4.0–10.5)
nRBC: 0 % (ref 0.0–0.2)

## 2024-06-08 LAB — IRON AND TIBC
Iron: 59 ug/dL (ref 28–170)
Saturation Ratios: 21 % (ref 10.4–31.8)
TIBC: 276 ug/dL (ref 250–450)
UIBC: 217 ug/dL

## 2024-06-08 LAB — VITAMIN B12: Vitamin B-12: 700 pg/mL (ref 180–914)

## 2024-06-08 LAB — COMPREHENSIVE METABOLIC PANEL WITH GFR
ALT: 17 U/L (ref 0–44)
AST: 16 U/L (ref 15–41)
Albumin: 4.4 g/dL (ref 3.5–5.0)
Alkaline Phosphatase: 70 U/L (ref 38–126)
Anion gap: 13 (ref 5–15)
BUN: 67 mg/dL — ABNORMAL HIGH (ref 8–23)
CO2: 23 mmol/L (ref 22–32)
Calcium: 9.3 mg/dL (ref 8.9–10.3)
Chloride: 104 mmol/L (ref 98–111)
Creatinine, Ser: 3.29 mg/dL — ABNORMAL HIGH (ref 0.44–1.00)
GFR, Estimated: 13 mL/min — ABNORMAL LOW (ref >60)
Glucose, Bld: 107 mg/dL — ABNORMAL HIGH (ref 70–99)
Potassium: 3.9 mmol/L (ref 3.5–5.1)
Sodium: 140 mmol/L (ref 135–145)
Total Bilirubin: 0.7 mg/dL (ref 0.0–1.2)
Total Protein: 7.4 g/dL (ref 6.5–8.1)

## 2024-06-08 LAB — FERRITIN: Ferritin: 822 ng/mL — ABNORMAL HIGH (ref 11–307)

## 2024-06-08 MED ORDER — EPOETIN ALFA 20000 UNIT/ML IJ SOLN
20000.0000 [IU] | Freq: Once | INTRAMUSCULAR | Status: AC
  Administered 2024-06-08: 20000 [IU] via SUBCUTANEOUS
  Filled 2024-06-08: qty 1

## 2024-06-08 NOTE — Assessment & Plan Note (Addendum)
 She currently receives B12 injections monthly. Most recent B12 level is pending.  Continue for now.

## 2024-06-08 NOTE — Patient Instructions (Signed)

## 2024-06-08 NOTE — Assessment & Plan Note (Addendum)
 Ferritin greater than 900. Discussed with Dr. Davonna this is likely secondary to CKD. No additional IV iron  at this time. Plan on checking ferritin levels every 3 months or so.

## 2024-06-08 NOTE — Progress Notes (Signed)
 [facility] OFFICE PROGRESS NOTE  Leigh Lung, MD  ASSESSMENT & PLAN:    Assessment & Plan Anemia in chronic kidney disease, unspecified CKD stage Currently she is receiving Retacrit  for anemia in the setting of CKD. Hemoglobin has been stable between 8.9-10.1 since January 2025. She is currently receiving Retacrit  20,000 units every 2 weeks. Labs from 06/08/2024 show hemoglobin of 9.5.  Proceed with Retacrit .  Goal hemoglobin is between 10 and 11.  Hold for hemoglobin greater than 10. Elevated ferritin Ferritin greater than 900. Discussed with Dr. Davonna this is likely secondary to CKD. No additional IV iron  at this time. Plan on checking ferritin levels every 3 months or so. Vitamin B12 deficiency disease She currently receives B12 injections monthly. Most recent B12 level is pending.  Continue for now.  Orders Placed This Encounter  Procedures   Ferritin    Standing Status:   Future    Expected Date:   09/07/2024    Expiration Date:   12/06/2024   Iron  and TIBC ([facility] ONLY)    Standing Status:   Future    Expected Date:   09/07/2024    Expiration Date:   12/06/2024   Vitamin B12    Standing Status:   Future    Expected Date:   09/07/2024    Expiration Date:   12/06/2024   CBC with Differential/Platelet    Standing Status:   Future    Expected Date:   09/07/2024    Expiration Date:   12/06/2024   Comprehensive metabolic panel    Standing Status:   Future    Expected Date:   09/07/2024    Expiration Date:   12/06/2024   Methylmalonic Acid    Standing Status:   Future    Expected Date:   09/07/2024    Expiration Date:   12/06/2024    INTERVAL HISTORY: Patient returns for follow-up for anemia in CKD and elevated ferritin levels.  She also has B12 deficiency and receives monthly B12 shots.  She receives Retacrit  every 2 weeks for anemia due to CKD.  Reports overall feeling stable since her last visit with us .  Appetite is 100% energy levels are  80%.  She has fairly significant right sided hip pain.  Reports she has seen orthopedics and they have recommended physical therapy although is not been very helpful.  Patient thinks it may actually be working against her.  She has occasional constipation.  Has urinary urgency at times.  Has numbness and tingling in her fingers.  No bleeding.  We reviewed ferritin, iron  panel, B12, CBC, CMP and MMA.  SUMMARY OF HEMATOLOGIC HISTORY: Oncology History   No history exists.     CBC    Component Value Date/Time   WBC 4.6 06/08/2024 0935   RBC 3.15 (L) 06/08/2024 0935   HGB 9.5 (L) 06/08/2024 0935   HCT 31.3 (L) 06/08/2024 0935   PLT 215 06/08/2024 0935   MCV 99.4 06/08/2024 0935   MCH 30.2 06/08/2024 0935   MCHC 30.4 06/08/2024 0935   RDW 16.1 (H) 06/08/2024 0935   LYMPHSABS 0.9 06/08/2024 0935   MONOABS 0.4 06/08/2024 0935   EOSABS 0.1 06/08/2024 0935   BASOSABS 0.0 06/08/2024 0935       Latest Ref Rng & Units 06/08/2024    9:35 AM 05/31/2024   11:06 AM 05/09/2024   11:18 AM  CMP  Glucose 70 - 99 mg/dL 892  867  790   BUN 8 -  23 mg/dL 67  72  72   Creatinine 0.44 - 1.00 mg/dL 6.70  5.41  5.95   Sodium 135 - 145 mmol/L 140  139  138   Potassium 3.5 - 5.1 mmol/L 3.9  4.2  4.3   Chloride 98 - 111 mmol/L 104  100  100   CO2 22 - 32 mmol/L 23  25  23    Calcium 8.9 - 10.3 mg/dL 9.3  9.1  9.3   Total Protein 6.5 - 8.1 g/dL 7.4     Total Bilirubin 0.0 - 1.2 mg/dL 0.7     Alkaline Phos 38 - 126 U/L 70     AST 15 - 41 U/L 16     ALT 0 - 44 U/L 17        Lab Results  Component Value Date   FERRITIN 822 (H) 06/08/2024   VITAMINB12 700 06/08/2024    Vitals:   06/08/24 1040  BP: 135/70  Pulse: 62  Resp: 18  Temp: 98.1 F (36.7 C)  SpO2: 100%    Review of System:  Review of Systems  Constitutional:  Positive for malaise/fatigue.  Gastrointestinal:  Positive for constipation.  Genitourinary:  Positive for frequency.  Musculoskeletal:  Positive for joint pain.   Neurological:  Positive for tingling.    Physical Exam: Physical Exam Constitutional:      Appearance: Normal appearance.     Comments: Sitting in a wheelchair.  HENT:     Head: Normocephalic and atraumatic.  Eyes:     Pupils: Pupils are equal, round, and reactive to light.  Cardiovascular:     Rate and Rhythm: Normal rate and regular rhythm.     Heart sounds: Normal heart sounds. No murmur heard. Pulmonary:     Effort: Pulmonary effort is normal.     Breath sounds: Normal breath sounds. No wheezing.  Abdominal:     General: Bowel sounds are normal. There is no distension.     Palpations: Abdomen is soft.     Tenderness: There is no abdominal tenderness.  Musculoskeletal:        General: Normal range of motion.     Cervical back: Normal range of motion.  Skin:    General: Skin is warm and dry.     Findings: No rash.  Neurological:     Mental Status: She is alert and oriented to person, place, and time.     Gait: Gait is intact.  Psychiatric:        Mood and Affect: Mood and affect normal.        Cognition and Memory: Memory normal.        Judgment: Judgment normal.      I spent 20 minutes dedicated to the care of this patient (face-to-face and non-face-to-face) on the date of the encounter to include what is described in the assessment and plan.,  Delon Hope, NP 06/08/2024 10:59 AM

## 2024-06-08 NOTE — Progress Notes (Signed)
 Patient's Hgb 9.5 and blood pressure stable. Patient tolerated Retacrit  injection with no complaints voiced.  Site clean and dry with no bruising or swelling noted at site.  See MAR for details.  Band aid applied.  Patient stable during and after injection.  Vss with discharge and left in satisfactory condition with no s/s of distress noted. All follow ups as scheduled.   Stephanie Middleton

## 2024-06-08 NOTE — Assessment & Plan Note (Addendum)
 Currently she is receiving Retacrit  for anemia in the setting of CKD. Hemoglobin has been stable between 8.9-10.1 since January 2025. She is currently receiving Retacrit  20,000 units every 2 weeks. Labs from 06/08/2024 show hemoglobin of 9.5.  Proceed with Retacrit .  Goal hemoglobin is between 10 and 11.  Hold for hemoglobin greater than 10.

## 2024-06-09 ENCOUNTER — Other Ambulatory Visit (HOSPITAL_COMMUNITY)
Admission: RE | Admit: 2024-06-09 | Discharge: 2024-06-09 | Disposition: A | Discharge status: Home / Self Care | Source: Ambulatory Visit | Attending: Nephrology | Admitting: Nephrology

## 2024-06-09 DIAGNOSIS — D631 Anemia in chronic kidney disease: Secondary | ICD-10-CM | POA: Diagnosis not present

## 2024-06-09 DIAGNOSIS — N189 Chronic kidney disease, unspecified: Secondary | ICD-10-CM | POA: Insufficient documentation

## 2024-06-09 DIAGNOSIS — N185 Chronic kidney disease, stage 5: Secondary | ICD-10-CM | POA: Diagnosis not present

## 2024-06-09 DIAGNOSIS — R809 Proteinuria, unspecified: Secondary | ICD-10-CM | POA: Diagnosis not present

## 2024-06-09 DIAGNOSIS — E211 Secondary hyperparathyroidism, not elsewhere classified: Secondary | ICD-10-CM | POA: Insufficient documentation

## 2024-06-09 DIAGNOSIS — I129 Hypertensive chronic kidney disease with stage 1 through stage 4 chronic kidney disease, or unspecified chronic kidney disease: Secondary | ICD-10-CM | POA: Diagnosis not present

## 2024-06-09 DIAGNOSIS — N2581 Secondary hyperparathyroidism of renal origin: Secondary | ICD-10-CM | POA: Diagnosis not present

## 2024-06-09 LAB — CBC
HCT: 30.7 % — ABNORMAL LOW (ref 36.0–46.0)
Hemoglobin: 9.4 g/dL — ABNORMAL LOW (ref 12.0–15.0)
MCH: 30.5 pg (ref 26.0–34.0)
MCHC: 30.6 g/dL (ref 30.0–36.0)
MCV: 99.7 fL (ref 80.0–100.0)
Platelets: 199 K/uL (ref 150–400)
RBC: 3.08 MIL/uL — ABNORMAL LOW (ref 3.87–5.11)
RDW: 16.3 % — ABNORMAL HIGH (ref 11.5–15.5)
WBC: 5.7 K/uL (ref 4.0–10.5)
nRBC: 0 % (ref 0.0–0.2)

## 2024-06-09 LAB — RENAL FUNCTION PANEL
Albumin: 4.3 g/dL (ref 3.5–5.0)
Anion gap: 15 (ref 5–15)
BUN: 70 mg/dL — ABNORMAL HIGH (ref 8–23)
CO2: 20 mmol/L — ABNORMAL LOW (ref 22–32)
Calcium: 9.4 mg/dL (ref 8.9–10.3)
Chloride: 105 mmol/L (ref 98–111)
Creatinine, Ser: 3.39 mg/dL — ABNORMAL HIGH (ref 0.44–1.00)
GFR, Estimated: 12 mL/min — ABNORMAL LOW (ref >60)
Glucose, Bld: 204 mg/dL — ABNORMAL HIGH (ref 70–99)
Phosphorus: 4.3 mg/dL (ref 2.5–4.6)
Potassium: 4.2 mmol/L (ref 3.5–5.1)
Sodium: 140 mmol/L (ref 135–145)

## 2024-06-09 LAB — HEPATITIS B CORE ANTIBODY, IGM: Hep B C IgM: NONREACTIVE

## 2024-06-09 LAB — PROTEIN / CREATININE RATIO, URINE
Creatinine, Urine: 100 mg/dL
Protein Creatinine Ratio: 0.88 mg/mg{creat} — ABNORMAL HIGH (ref 0.00–0.15)
Total Protein, Urine: 88 mg/dL

## 2024-06-09 LAB — HEPATITIS B SURFACE ANTIGEN: Hepatitis B Surface Ag: NONREACTIVE

## 2024-06-10 LAB — HEPATITIS B CORE ANTIBODY, TOTAL: HEP B CORE AB: NEGATIVE

## 2024-06-10 LAB — HEPATITIS B SURFACE ANTIBODY, QUANTITATIVE: Hep B S AB Quant (Post): 10.6 m[IU]/mL

## 2024-06-10 LAB — HCV INTERPRETATION

## 2024-06-10 LAB — HCV AB W REFLEX TO QUANT PCR: HCV Ab: NONREACTIVE

## 2024-06-11 LAB — PARATHYROID HORMONE, INTACT (NO CA): PTH: 199 pg/mL — ABNORMAL HIGH (ref 15–65)

## 2024-06-12 LAB — METHYLMALONIC ACID, SERUM: Methylmalonic Acid, Quantitative: 321 nmol/L (ref 0–378)

## 2024-06-13 LAB — QUANTIFERON-TB GOLD PLUS (RQFGPL)
QuantiFERON Mitogen Value: >10 [IU]/mL
QuantiFERON Nil Value: 0.06 [IU]/mL
QuantiFERON TB1 Ag Value: 0.04 [IU]/mL
QuantiFERON TB2 Ag Value: 0.08 [IU]/mL

## 2024-06-13 LAB — QUANTIFERON-TB GOLD PLUS: QuantiFERON-TB Gold Plus: NEGATIVE

## 2024-06-16 ENCOUNTER — Ambulatory Visit: Admitting: Podiatry

## 2024-06-16 ENCOUNTER — Encounter: Payer: Self-pay | Admitting: Podiatry

## 2024-06-16 DIAGNOSIS — M79675 Pain in left toe(s): Secondary | ICD-10-CM

## 2024-06-16 DIAGNOSIS — M79674 Pain in right toe(s): Secondary | ICD-10-CM | POA: Diagnosis not present

## 2024-06-16 DIAGNOSIS — N186 End stage renal disease: Secondary | ICD-10-CM | POA: Diagnosis not present

## 2024-06-16 DIAGNOSIS — E1122 Type 2 diabetes mellitus with diabetic chronic kidney disease: Secondary | ICD-10-CM | POA: Diagnosis not present

## 2024-06-16 DIAGNOSIS — B351 Tinea unguium: Secondary | ICD-10-CM

## 2024-06-17 ENCOUNTER — Other Ambulatory Visit (HOSPITAL_COMMUNITY)
Admission: RE | Admit: 2024-06-17 | Discharge: 2024-06-17 | Disposition: A | Discharge status: Home / Self Care | Source: Other Acute Inpatient Hospital | Attending: Nephrology | Admitting: Nephrology

## 2024-06-17 DIAGNOSIS — N185 Chronic kidney disease, stage 5: Secondary | ICD-10-CM | POA: Insufficient documentation

## 2024-06-17 LAB — COMPREHENSIVE METABOLIC PANEL WITH GFR
ALT: 17 U/L (ref 0–44)
AST: 16 U/L (ref 15–41)
Albumin: 4.3 g/dL (ref 3.5–5.0)
Alkaline Phosphatase: 58 U/L (ref 38–126)
Anion gap: 13 (ref 5–15)
BUN: 69 mg/dL — ABNORMAL HIGH (ref 8–23)
CO2: 23 mmol/L (ref 22–32)
Calcium: 9.2 mg/dL (ref 8.9–10.3)
Chloride: 104 mmol/L (ref 98–111)
Creatinine, Ser: 3.8 mg/dL — ABNORMAL HIGH (ref 0.44–1.00)
GFR, Estimated: 11 mL/min — ABNORMAL LOW (ref >60)
Glucose, Bld: 68 mg/dL — ABNORMAL LOW (ref 70–99)
Potassium: 4 mmol/L (ref 3.5–5.1)
Sodium: 140 mmol/L (ref 135–145)
Total Bilirubin: 1 mg/dL (ref 0.0–1.2)
Total Protein: 7.2 g/dL (ref 6.5–8.1)

## 2024-06-19 ENCOUNTER — Other Ambulatory Visit: Payer: Self-pay

## 2024-06-19 ENCOUNTER — Ambulatory Visit (HOSPITAL_COMMUNITY)
Admission: RE | Admit: 2024-06-19 | Discharge: 2024-06-19 | Disposition: A | Discharge status: Home / Self Care | Attending: Vascular Surgery | Admitting: Vascular Surgery

## 2024-06-19 ENCOUNTER — Ambulatory Visit (HOSPITAL_COMMUNITY)
Admission: RE | Disposition: A | Discharge status: Home / Self Care | Payer: Self-pay | Source: Home / Self Care | Attending: Vascular Surgery

## 2024-06-19 DIAGNOSIS — I120 Hypertensive chronic kidney disease with stage 5 chronic kidney disease or end stage renal disease: Secondary | ICD-10-CM | POA: Diagnosis not present

## 2024-06-19 DIAGNOSIS — N186 End stage renal disease: Secondary | ICD-10-CM | POA: Diagnosis not present

## 2024-06-19 DIAGNOSIS — E1122 Type 2 diabetes mellitus with diabetic chronic kidney disease: Secondary | ICD-10-CM | POA: Insufficient documentation

## 2024-06-19 HISTORY — PX: DIALYSIS/PERMA CATHETER INSERTION: CATH118288

## 2024-06-19 LAB — GLUCOSE, CAPILLARY: Glucose-Capillary: 73 mg/dL (ref 70–99)

## 2024-06-19 SURGERY — DIALYSIS/PERMA CATHETER INSERTION
Anesthesia: LOCAL | Site: Chest

## 2024-06-19 MED ORDER — MIDAZOLAM HCL 2 MG/2ML IJ SOLN
INTRAMUSCULAR | Status: AC
  Filled 2024-06-19: qty 2

## 2024-06-19 MED ORDER — HEPARIN SODIUM (PORCINE) 1000 UNIT/ML IJ SOLN
INTRAMUSCULAR | Status: DC | PRN
  Administered 2024-06-19: 3200 [IU] via INTRAVENOUS

## 2024-06-19 MED ORDER — FENTANYL CITRATE (PF) 100 MCG/2ML IJ SOLN
INTRAMUSCULAR | Status: DC | PRN
  Administered 2024-06-19: 25 ug via INTRAVENOUS

## 2024-06-19 MED ORDER — FENTANYL CITRATE (PF) 100 MCG/2ML IJ SOLN
INTRAMUSCULAR | Status: AC
  Filled 2024-06-19: qty 2

## 2024-06-19 MED ORDER — HEPARIN SODIUM (PORCINE) 1000 UNIT/ML IJ SOLN
INTRAMUSCULAR | Status: AC
  Filled 2024-06-19: qty 10

## 2024-06-19 MED ORDER — LIDOCAINE HCL (PF) 1 % IJ SOLN
INTRAMUSCULAR | Status: AC
  Filled 2024-06-19: qty 30

## 2024-06-19 MED ORDER — HEPARIN (PORCINE) IN NACL 1000-0.9 UT/500ML-% IV SOLN
INTRAVENOUS | Status: DC | PRN
  Administered 2024-06-19: 500 mL via SURGICAL_CAVITY

## 2024-06-19 MED ORDER — LIDOCAINE HCL (PF) 1 % IJ SOLN
INTRAMUSCULAR | Status: DC | PRN
  Administered 2024-06-19: 10 mL

## 2024-06-19 MED ORDER — MIDAZOLAM HCL 2 MG/2ML IJ SOLN
INTRAMUSCULAR | Status: DC | PRN
  Administered 2024-06-19: .5 mg via INTRAVENOUS

## 2024-06-19 SURGICAL SUPPLY — 5 items
CATH PALINDROME-P 19CM W/VT (CATHETERS) IMPLANT
KIT MICROPUNCTURE NIT STIFF (SHEATH) IMPLANT
KIT PV (KITS) ×1 IMPLANT
SHEATH PROBE COVER 6X72 (BAG) IMPLANT
TRAY PV CATH (CUSTOM PROCEDURE TRAY) ×1 IMPLANT

## 2024-06-19 NOTE — H&P (Signed)
 [facility] [place]  ASSESSMENT / PLAN: 89 y.o. female with new diagnosis of end-stage renal disease.  Plan catheter placement today to initiate dialysis tomorrow.  Will plan to make new dialysis access in the right upper extremity after vein mapping in the coming weeks.  CHIEF COMPLAINT: New diagnosis of end-stage renal disease  HISTORY OF PRESENT ILLNESS: Stephanie Middleton is a 89 y.o. female referred to the dialysis center for new diagnosis of end-stage renal disease.  She is due to start dialysis tomorrow.  Very little of dialysis mechanics have been explained to her.  We reviewed the rationale for placing a tunneled dialysis catheter to initiate dialysis.  We explained the rationale for creating permanent dialysis access to reduce her risk of bloodstream infection.  The patient is left-handed.  No history of pacemaker or upper extremity surgery before.  Past Medical History:  Diagnosis Date   Anemia    Chronic kidney disease    Chronic pain    Back, right hip and right knee   Diabetes mellitus without complication (HCC)    Diplopia    Hypercholesteremia    Hypertension     Past Surgical History:  Procedure Laterality Date   CHOLECYSTECTOMY     CYST EXCISION      Family History  Problem Relation Age of Onset   Leukemia Mother    Heart disease Father     Social History   Socioeconomic History   Marital status: Widowed    Spouse name: Not on file   Number of children: Not on file   Years of education: Not on file   Highest education level: Not on file  Occupational History   Not on file  Tobacco Use   Smoking status: Never   Smokeless tobacco: Never  Vaping Use   Vaping status: Never Used  Substance and Sexual Activity   Alcohol use: Never   Drug use: Never   Sexual activity: Not Currently    Birth control/protection: None  Other Topics Concern   Not on file  Social History Narrative   Lives with daughter   Social Drivers of Health    Financial Resource Strain: Not on file  Food Insecurity: No Food Insecurity (09/22/2023)   Hunger Vital Sign    Worried About Running Out of Food in the Last Year: Never true    Ran Out of Food in the Last Year: Never true  Transportation Needs: No Transportation Needs (09/22/2023)   PRAPARE - Administrator, Civil Service (Medical): No    Lack of Transportation (Non-Medical): No  Physical Activity: Not on file  Stress: Not on file  Social Connections: Not on file  Intimate Partner Violence: Not At Risk (09/22/2023)   Humiliation, Afraid, Rape, and Kick questionnaire    Fear of Current or Ex-Partner: No    Emotionally Abused: No    Physically Abused: No    Sexually Abused: No    No Known Allergies  No current facility-administered medications for this encounter.    PHYSICAL EXAM Vitals:   06/19/24 0832 06/19/24 0849  BP: (!) 168/72 (!) 171/67  Pulse: 69 68  Resp: 12 16  Temp: 98.3 F (36.8 C)   TempSrc: Oral   SpO2: 98% 100%   Elderly woman in no distress Regular rate and rhythm Unlabored breathing  PERTINENT LABORATORY AND RADIOLOGIC DATA  Most recent CBC    Latest Ref Rng & Units 06/09/2024   11:54 AM 06/08/2024  9:35 AM 05/31/2024   11:06 AM  CBC  WBC 4.0 - 10.5 K/uL 5.7  4.6  4.7   Hemoglobin 12.0 - 15.0 g/dL 9.4  9.5  89.8   Hematocrit 36.0 - 46.0 % 30.7  31.3  32.2   Platelets 150 - 400 K/uL 199  215  237      Most recent CMP    Latest Ref Rng & Units 06/17/2024    3:00 PM 06/09/2024   11:53 AM 06/08/2024    9:35 AM  CMP  Glucose 70 - 99 mg/dL 68  795  892   BUN 8 - 23 mg/dL 69  70  67   Creatinine 0.44 - 1.00 mg/dL 6.19  6.60  6.70   Sodium 135 - 145 mmol/L 140  140  140   Potassium 3.5 - 5.1 mmol/L 4.0  4.2  3.9   Chloride 98 - 111 mmol/L 104  105  104   CO2 22 - 32 mmol/L 23  20  23    Calcium 8.9 - 10.3 mg/dL 9.2  9.4  9.3   Total Protein 6.5 - 8.1 g/dL 7.2   7.4   Total Bilirubin 0.0 - 1.2 mg/dL 1.0   0.7   Alkaline Phos 38 -  126 U/L 58   70   AST 15 - 41 U/L 16   16   ALT 0 - 44 U/L 17   17     Renal function CrCl cannot be calculated (Unknown ideal weight.).  Hgb A1c MFr Bld (% of total Hgb)  Date Value  09/30/2023 5.9 (H)    LDL Cholesterol  Date Value Ref Range Status  07/23/2022 98 0 - 99 mg/dL Final    Comment:           Total Cholesterol/HDL:CHD Risk Coronary Heart Disease Risk Table                     Men   Women  1/2 Average Risk   3.4   3.3  Average Risk       5.0   4.4  2 X Average Risk   9.6   7.1  3 X Average Risk  23.4   11.0        Use the calculated Patient Ratio above and the CHD Risk Table to determine the patient's CHD Risk.        ATP III CLASSIFICATION (LDL):  <100     mg/dL   Optimal  899-870  mg/dL   Near or Above                    Optimal  130-159  mg/dL   Borderline  839-810  mg/dL   High  >809     mg/dL   Very High Performed at [facility], [address], [place], [place] [address]     Debby SAILOR. Magda, MD FACS [facility] Phone Number: [phone] 06/19/2024 9:36 AM   Total time spent on preparing this encounter including chart review, data review, collecting history, examining the patient, and coordinating care: 45 min  Portions of this report may have been transcribed using voice recognition software.  Every effort has been made to ensure accuracy; however, inadvertent computerized transcription errors may still be present.

## 2024-06-19 NOTE — Op Note (Signed)
 DATE OF SERVICE: 06/19/2024  PATIENT:  Stephanie Middleton  89 y.o. female  PRE-OPERATIVE DIAGNOSIS:  new diagnosis of end-stage renal disease  POST-OPERATIVE DIAGNOSIS:  Same  PROCEDURE:   1) Ultrasound guided right internal jugular vein access (CPT [phone]) 2) placement of tunneled dialysis catheter (CPT 36558) 3) conscious sedation (12 minutes) (CPT 99152) 4) new outpatient evaluation and management - level 4 (CPT 99204)  SURGEON:  Debby SAILOR. Magda, MD  ASSISTANT: none  ANESTHESIA:   local and IV sedation  ESTIMATED BLOOD LOSS: min  LOCAL MEDICATIONS USED:  LIDOCAINE    COUNTS: confirmed correct.  PATIENT DISPOSITION:  PACU - hemodynamically stable.   Delay start of Pharmacological VTE agent (>24hrs) due to surgical blood loss or risk of bleeding: no  INDICATION FOR PROCEDURE: Stephanie Middleton is a 89 y.o. female with new diagnosis of ESRD. She is due to start dialysis tomorrow. After careful discussion of risks, benefits, and alternatives the patient was offered tunneled dialysis catheter placement. The patient understood and wished to proceed.  OPERATIVE FINDINGS:  Successful placement of tunneled dialysis catheter  DESCRIPTION OF PROCEDURE: After identification of the patient in the pre-operative holding area, the patient was transferred to the operating room. The patient was positioned supine on the operating room table.  The right neck and chest were prepped and draped in standard fashion. A surgical pause was performed confirming correct patient, procedure, and operative location.  The right neck and chest were anesthetized with subcutaneous injection of 1% lidocaine  over the area of planned access. Using ultrasound guidance, the right jugular vein was accessed with micropuncture technique. Through the micropuncture sheath a floppy J-wire was advanced into the superior vena cava.  A small incision was made around the skin access point.  The access point was serially dilated  under direct fluoroscopic guidance.  A peel-away sheath was introduced into the superior vena cava under fluoroscopic guidance.  A counterincision was made in the chest under the clavicle.  A 19 cm tunnel dialysis catheter was then tunneled under the skin, over the clavicle into the incision in the neck.  The tunneling device was removed and the catheter fed through the peel-away sheath into the superior vena cava.  The peel-away sheath was removed and the catheter gently pulled back.  Adequate position was confirmed with x-ray.  The catheter was tested and found to flush and draw back well.  Catheter was heparin  locked.  Caps were applied.  Catheter was sutured to the skin.  The neck incision was closed with 4-0 Monocryl.  Upon completion of the case instrument and sharps counts were confirmed correct. The patient was transferred to the PACU in good condition. I was present for all portions of the procedure.  PLAN: OK to use catheter. Will arrange for right arm AV access surgery in the coming weeks after vein mapping.   Debby SAILOR. Magda, MD [facility] Phone Number: [phone] 06/19/2024 10:16 AM

## 2024-06-20 ENCOUNTER — Other Ambulatory Visit: Payer: Self-pay

## 2024-06-20 ENCOUNTER — Other Ambulatory Visit: Payer: Self-pay | Admitting: *Deleted

## 2024-06-20 ENCOUNTER — Encounter: Payer: Self-pay | Admitting: Podiatry

## 2024-06-20 DIAGNOSIS — N2581 Secondary hyperparathyroidism of renal origin: Secondary | ICD-10-CM | POA: Diagnosis not present

## 2024-06-20 DIAGNOSIS — N186 End stage renal disease: Secondary | ICD-10-CM | POA: Diagnosis not present

## 2024-06-20 DIAGNOSIS — D631 Anemia in chronic kidney disease: Secondary | ICD-10-CM

## 2024-06-20 DIAGNOSIS — Z992 Dependence on renal dialysis: Secondary | ICD-10-CM | POA: Diagnosis not present

## 2024-06-20 MED FILL — Heparin Sodium (Porcine) Inj 1000 Unit/ML: INTRAMUSCULAR | Qty: 10 | Status: AC

## 2024-06-20 NOTE — Progress Notes (Signed)
 Subjective:  Patient ID: Stephanie Middleton, female    DOB: [birth year],  MRN: [id number]  Stephanie Middleton presents to clinic today for at risk foot care. Pt has h/o NIDDM with chronic kidney disease and painful elongated mycotic toenails 1-5 bilaterally which are tender when wearing enclosed shoe gear. Pain is relieved with periodic professional debridement. She is accompanied by her daughter on today's visit. Patient recently diagnosed with ESRD which now requires dialysis. Will have procedure for access performed by Vascular Surgery next week as well as first dialysis treatment.  She relates her right great toe medial border is tender. Denies any redness, swelling or drainage. Chief Complaint  Patient presents with   [facility]    Rm1 Diabetic foot care/ blood sugar 76/A1C 5.9/ Dr. Elna Potters last visit April 2025   PCP is Potters Elna, MD.  No Known Allergies  Review of Systems: Negative except as noted in the HPI.  Objective: No changes noted in today's physical examination. There were no vitals filed for this visit. Stephanie Middleton is a pleasant 89 y.o. female WD, WN in NAD. AAO x 3.  Vascular Examination: Capillary refill time immediate b/l. Vascular status intact b/l with palpable pedal pulses. Pedal hair present b/l. No pain with calf compression b/l. Skin temperature gradient WNL b/l. No cyanosis or clubbing b/l. No ischemia or gangrene noted b/l. Dependent edema noted BLE.  Neurological Examination: Protective sensation intact 5/5 intact bilaterally with 10g monofilament b/l. Vibratory sensation intact right lower extremity. Vibratory sensation diminished left lower extremity.  Dermatological Examination: Pedal skin with normal turgor, texture and tone b/l. No open wounds nor interdigital macerations noted.   Incurvated nailplate right great toe medial border(s) with tenderness to palpation. No erythema, no edema, no drainage noted.   Toenails left great toe and 2-5 b/l thick,  discolored, elongated with subungual debris and pain on dorsal palpation. No hyperkeratotic lesions noted b/l.   Musculoskeletal Examination: Muscle strength 5/5 to all lower extremity muscle groups bilaterally. HAV with bunion deformity noted b/l LE. Hammertoe deformity noted 2-5 b/l.  Radiographs: None  Assessment/Plan: 1. Pain due to onychomycosis of toenails of both feet   2. Diabetes mellitus with ESRD (end-stage renal disease) (HCC)   -Patient was evaluated today. All questions/concerns addressed on today's visit. -Patient's family member present. All questions/concerns addressed on today's visit. -Discussed new diagnosis of ESRD requiring dialysis. To assist with new diagnosis, advised daughter to reach out to dialysis social worker for information on support group. They related understanding. -Continue foot and shoe inspections daily. Monitor blood glucose per PCP/Endocrinologist's recommendations. -Patient to continue soft, supportive shoe gear daily. -Toenails were debrided in length and girth 2-5 bilaterally and left great toe with sterile nail nippers and dremel without iatrogenic bleeding.  -No invasive procedure(s) performed. Offending nail border debrided and curretaged right great toe utilizing sterile nail nipper and currette. Border(s) cleansed with alcohol and triple antibiotic ointment applied. Patient/POA/Caregiver/Facility instructed to apply Neosporin Cream  to right great toe once daily for 7 days. Call office if there are any concerns. -Patient/POA to call should there be question/concern in the interim.   Return in about 3 months (around 09/15/2024).  Stephanie Middleton, DPM      [place] LOCATION: [address]  [place], [place] [address]                   Office [phone]   [facility] LOCATION: [address] [place], [place] [address] Office [phone]

## 2024-06-21 ENCOUNTER — Inpatient Hospital Stay

## 2024-06-22 ENCOUNTER — Other Ambulatory Visit

## 2024-06-22 ENCOUNTER — Ambulatory Visit

## 2024-06-22 DIAGNOSIS — Z992 Dependence on renal dialysis: Secondary | ICD-10-CM | POA: Diagnosis not present

## 2024-06-22 DIAGNOSIS — N186 End stage renal disease: Secondary | ICD-10-CM | POA: Diagnosis not present

## 2024-06-22 DIAGNOSIS — N2581 Secondary hyperparathyroidism of renal origin: Secondary | ICD-10-CM | POA: Diagnosis not present

## 2024-06-23 ENCOUNTER — Ambulatory Visit (HOSPITAL_COMMUNITY)

## 2024-06-23 DIAGNOSIS — Z992 Dependence on renal dialysis: Secondary | ICD-10-CM | POA: Diagnosis not present

## 2024-06-23 DIAGNOSIS — N186 End stage renal disease: Secondary | ICD-10-CM | POA: Diagnosis not present

## 2024-06-23 DIAGNOSIS — N2581 Secondary hyperparathyroidism of renal origin: Secondary | ICD-10-CM | POA: Diagnosis not present

## 2024-06-26 ENCOUNTER — Telehealth: Payer: Self-pay

## 2024-06-26 DIAGNOSIS — Z992 Dependence on renal dialysis: Secondary | ICD-10-CM | POA: Diagnosis not present

## 2024-06-26 DIAGNOSIS — N2581 Secondary hyperparathyroidism of renal origin: Secondary | ICD-10-CM | POA: Diagnosis not present

## 2024-06-26 DIAGNOSIS — N186 End stage renal disease: Secondary | ICD-10-CM | POA: Diagnosis not present

## 2024-06-26 NOTE — Telephone Encounter (Signed)
 Attempted to call for surgery scheduling. LVM    **appears patient rescheduled 9/19 vein mapping to 9/24.

## 2024-06-27 DIAGNOSIS — E1122 Type 2 diabetes mellitus with diabetic chronic kidney disease: Secondary | ICD-10-CM | POA: Diagnosis not present

## 2024-06-27 DIAGNOSIS — Z992 Dependence on renal dialysis: Secondary | ICD-10-CM | POA: Diagnosis not present

## 2024-06-27 DIAGNOSIS — N2581 Secondary hyperparathyroidism of renal origin: Secondary | ICD-10-CM | POA: Diagnosis not present

## 2024-06-27 DIAGNOSIS — N186 End stage renal disease: Secondary | ICD-10-CM | POA: Diagnosis not present

## 2024-06-28 ENCOUNTER — Ambulatory Visit (HOSPITAL_COMMUNITY)
Admission: RE | Admit: 2024-06-28 | Discharge: 2024-06-28 | Disposition: A | Discharge status: Home / Self Care | Source: Ambulatory Visit | Attending: Vascular Surgery | Admitting: Vascular Surgery

## 2024-06-28 ENCOUNTER — Ambulatory Visit (HOSPITAL_BASED_OUTPATIENT_CLINIC_OR_DEPARTMENT_OTHER)
Admission: RE | Admit: 2024-06-28 | Discharge: 2024-06-28 | Disposition: A | Discharge status: Home / Self Care | Source: Ambulatory Visit | Attending: Vascular Surgery | Admitting: Vascular Surgery

## 2024-06-28 DIAGNOSIS — N186 End stage renal disease: Secondary | ICD-10-CM | POA: Insufficient documentation

## 2024-06-29 ENCOUNTER — Telehealth: Payer: Self-pay

## 2024-06-29 DIAGNOSIS — N2581 Secondary hyperparathyroidism of renal origin: Secondary | ICD-10-CM | POA: Diagnosis not present

## 2024-06-29 DIAGNOSIS — N186 End stage renal disease: Secondary | ICD-10-CM | POA: Diagnosis not present

## 2024-06-29 DIAGNOSIS — Z992 Dependence on renal dialysis: Secondary | ICD-10-CM | POA: Diagnosis not present

## 2024-06-29 NOTE — Telephone Encounter (Signed)
 Attempted to call for surgery scheduling. LVM

## 2024-06-30 ENCOUNTER — Telehealth: Payer: Self-pay

## 2024-06-30 DIAGNOSIS — Z992 Dependence on renal dialysis: Secondary | ICD-10-CM | POA: Diagnosis not present

## 2024-06-30 DIAGNOSIS — N2581 Secondary hyperparathyroidism of renal origin: Secondary | ICD-10-CM | POA: Diagnosis not present

## 2024-06-30 DIAGNOSIS — N186 End stage renal disease: Secondary | ICD-10-CM | POA: Diagnosis not present

## 2024-06-30 NOTE — Telephone Encounter (Signed)
 Multiple messages left for patient in re: to scheduling R arm AVF vs AVG with Dr. Magda.    Letter mailed to patient's home address.

## 2024-07-05 ENCOUNTER — Inpatient Hospital Stay

## 2024-07-05 DIAGNOSIS — Z992 Dependence on renal dialysis: Secondary | ICD-10-CM | POA: Diagnosis not present

## 2024-07-05 DIAGNOSIS — N186 End stage renal disease: Secondary | ICD-10-CM | POA: Diagnosis not present

## 2024-07-06 ENCOUNTER — Other Ambulatory Visit

## 2024-07-06 ENCOUNTER — Ambulatory Visit

## 2024-07-06 DIAGNOSIS — N186 End stage renal disease: Secondary | ICD-10-CM | POA: Diagnosis not present

## 2024-07-06 DIAGNOSIS — N2581 Secondary hyperparathyroidism of renal origin: Secondary | ICD-10-CM | POA: Diagnosis not present

## 2024-07-06 DIAGNOSIS — Z992 Dependence on renal dialysis: Secondary | ICD-10-CM | POA: Diagnosis not present

## 2024-07-07 DIAGNOSIS — N2581 Secondary hyperparathyroidism of renal origin: Secondary | ICD-10-CM | POA: Diagnosis not present

## 2024-07-11 DIAGNOSIS — N186 End stage renal disease: Secondary | ICD-10-CM | POA: Diagnosis not present

## 2024-07-11 DIAGNOSIS — N2581 Secondary hyperparathyroidism of renal origin: Secondary | ICD-10-CM | POA: Diagnosis not present

## 2024-07-11 DIAGNOSIS — Z992 Dependence on renal dialysis: Secondary | ICD-10-CM | POA: Diagnosis not present

## 2024-07-13 DIAGNOSIS — Z992 Dependence on renal dialysis: Secondary | ICD-10-CM | POA: Diagnosis not present

## 2024-07-13 DIAGNOSIS — N2581 Secondary hyperparathyroidism of renal origin: Secondary | ICD-10-CM | POA: Diagnosis not present

## 2024-07-13 DIAGNOSIS — N186 End stage renal disease: Secondary | ICD-10-CM | POA: Diagnosis not present

## 2024-07-14 DIAGNOSIS — N2581 Secondary hyperparathyroidism of renal origin: Secondary | ICD-10-CM | POA: Diagnosis not present

## 2024-07-14 DIAGNOSIS — Z992 Dependence on renal dialysis: Secondary | ICD-10-CM | POA: Diagnosis not present

## 2024-07-14 DIAGNOSIS — N186 End stage renal disease: Secondary | ICD-10-CM | POA: Diagnosis not present

## 2024-07-17 DIAGNOSIS — N186 End stage renal disease: Secondary | ICD-10-CM | POA: Diagnosis not present

## 2024-07-18 DIAGNOSIS — N2581 Secondary hyperparathyroidism of renal origin: Secondary | ICD-10-CM | POA: Diagnosis not present

## 2024-07-19 ENCOUNTER — Inpatient Hospital Stay

## 2024-07-20 ENCOUNTER — Other Ambulatory Visit

## 2024-07-20 ENCOUNTER — Ambulatory Visit

## 2024-07-20 DIAGNOSIS — N186 End stage renal disease: Secondary | ICD-10-CM | POA: Diagnosis not present

## 2024-07-21 DIAGNOSIS — N186 End stage renal disease: Secondary | ICD-10-CM | POA: Diagnosis not present

## 2024-07-25 ENCOUNTER — Other Ambulatory Visit (HOSPITAL_COMMUNITY): Payer: Self-pay | Admitting: Family Medicine

## 2024-07-25 ENCOUNTER — Ambulatory Visit (HOSPITAL_COMMUNITY)
Admission: RE | Admit: 2024-07-25 | Discharge: 2024-07-25 | Disposition: A | Discharge status: Home / Self Care | Source: Ambulatory Visit | Attending: Family Medicine | Admitting: Family Medicine

## 2024-07-25 DIAGNOSIS — M2011 Hallux valgus (acquired), right foot: Secondary | ICD-10-CM | POA: Diagnosis not present

## 2024-07-25 DIAGNOSIS — M79671 Pain in right foot: Secondary | ICD-10-CM | POA: Diagnosis not present

## 2024-07-27 DIAGNOSIS — Z794 Long term (current) use of insulin: Secondary | ICD-10-CM | POA: Diagnosis not present

## 2024-07-27 DIAGNOSIS — E1122 Type 2 diabetes mellitus with diabetic chronic kidney disease: Secondary | ICD-10-CM | POA: Diagnosis not present

## 2024-07-27 DIAGNOSIS — N186 End stage renal disease: Secondary | ICD-10-CM | POA: Diagnosis not present

## 2024-07-27 DIAGNOSIS — I509 Heart failure, unspecified: Secondary | ICD-10-CM | POA: Diagnosis not present

## 2024-07-27 DIAGNOSIS — E1142 Type 2 diabetes mellitus with diabetic polyneuropathy: Secondary | ICD-10-CM | POA: Diagnosis not present

## 2024-07-27 DIAGNOSIS — D84822 Immunodeficiency due to external causes: Secondary | ICD-10-CM | POA: Diagnosis not present

## 2024-07-27 DIAGNOSIS — K219 Gastro-esophageal reflux disease without esophagitis: Secondary | ICD-10-CM | POA: Diagnosis not present

## 2024-07-27 DIAGNOSIS — M1990 Unspecified osteoarthritis, unspecified site: Secondary | ICD-10-CM | POA: Diagnosis not present

## 2024-07-27 DIAGNOSIS — I132 Hypertensive heart and chronic kidney disease with heart failure and with stage 5 chronic kidney disease, or end stage renal disease: Secondary | ICD-10-CM | POA: Diagnosis not present

## 2024-07-28 DIAGNOSIS — N2581 Secondary hyperparathyroidism of renal origin: Secondary | ICD-10-CM | POA: Diagnosis not present

## 2024-07-28 DIAGNOSIS — Z992 Dependence on renal dialysis: Secondary | ICD-10-CM | POA: Diagnosis not present

## 2024-08-02 ENCOUNTER — Inpatient Hospital Stay

## 2024-08-03 ENCOUNTER — Ambulatory Visit

## 2024-08-03 ENCOUNTER — Other Ambulatory Visit

## 2024-08-04 DIAGNOSIS — N2581 Secondary hyperparathyroidism of renal origin: Secondary | ICD-10-CM | POA: Diagnosis not present

## 2024-08-04 DIAGNOSIS — Z992 Dependence on renal dialysis: Secondary | ICD-10-CM | POA: Diagnosis not present

## 2024-08-07 DIAGNOSIS — N2581 Secondary hyperparathyroidism of renal origin: Secondary | ICD-10-CM | POA: Diagnosis not present

## 2024-08-07 DIAGNOSIS — Z992 Dependence on renal dialysis: Secondary | ICD-10-CM | POA: Diagnosis not present

## 2024-08-07 DIAGNOSIS — N186 End stage renal disease: Secondary | ICD-10-CM | POA: Diagnosis not present

## 2024-08-08 DIAGNOSIS — N390 Urinary tract infection, site not specified: Secondary | ICD-10-CM | POA: Diagnosis not present

## 2024-08-11 DIAGNOSIS — N2581 Secondary hyperparathyroidism of renal origin: Secondary | ICD-10-CM | POA: Diagnosis not present

## 2024-08-11 DIAGNOSIS — Z992 Dependence on renal dialysis: Secondary | ICD-10-CM | POA: Diagnosis not present

## 2024-08-14 DIAGNOSIS — N2581 Secondary hyperparathyroidism of renal origin: Secondary | ICD-10-CM | POA: Diagnosis not present

## 2024-08-14 DIAGNOSIS — Z992 Dependence on renal dialysis: Secondary | ICD-10-CM | POA: Diagnosis not present

## 2024-08-16 ENCOUNTER — Inpatient Hospital Stay

## 2024-08-17 ENCOUNTER — Ambulatory Visit

## 2024-08-17 ENCOUNTER — Other Ambulatory Visit

## 2024-08-18 DIAGNOSIS — N2581 Secondary hyperparathyroidism of renal origin: Secondary | ICD-10-CM | POA: Diagnosis not present

## 2024-08-18 DIAGNOSIS — N186 End stage renal disease: Secondary | ICD-10-CM | POA: Diagnosis not present

## 2024-08-18 DIAGNOSIS — Z992 Dependence on renal dialysis: Secondary | ICD-10-CM | POA: Diagnosis not present

## 2024-08-25 ENCOUNTER — Other Ambulatory Visit (HOSPITAL_COMMUNITY)
Admission: RE | Admit: 2024-08-25 | Discharge: 2024-08-25 | Disposition: A | Discharge status: Home / Self Care | Source: Ambulatory Visit | Attending: Nephrology | Admitting: Nephrology

## 2024-08-28 DIAGNOSIS — N186 End stage renal disease: Secondary | ICD-10-CM | POA: Diagnosis not present

## 2024-08-29 DIAGNOSIS — Z992 Dependence on renal dialysis: Secondary | ICD-10-CM | POA: Diagnosis not present

## 2024-08-29 DIAGNOSIS — N186 End stage renal disease: Secondary | ICD-10-CM | POA: Diagnosis not present

## 2024-08-30 ENCOUNTER — Other Ambulatory Visit

## 2024-08-30 ENCOUNTER — Ambulatory Visit

## 2024-09-13 ENCOUNTER — Other Ambulatory Visit

## 2024-09-13 ENCOUNTER — Ambulatory Visit

## 2024-09-13 ENCOUNTER — Ambulatory Visit: Admitting: Oncology

## 2024-09-14 ENCOUNTER — Other Ambulatory Visit

## 2024-09-14 ENCOUNTER — Ambulatory Visit

## 2024-09-15 ENCOUNTER — Encounter: Payer: Self-pay | Admitting: Podiatry

## 2024-09-15 ENCOUNTER — Ambulatory Visit: Admitting: Podiatry

## 2024-09-15 DIAGNOSIS — E0822 Diabetes mellitus due to underlying condition with diabetic chronic kidney disease: Secondary | ICD-10-CM

## 2024-09-15 DIAGNOSIS — M79674 Pain in right toe(s): Secondary | ICD-10-CM | POA: Diagnosis not present

## 2024-09-15 DIAGNOSIS — N186 End stage renal disease: Secondary | ICD-10-CM

## 2024-09-15 DIAGNOSIS — Z992 Dependence on renal dialysis: Secondary | ICD-10-CM

## 2024-09-15 DIAGNOSIS — M79675 Pain in left toe(s): Secondary | ICD-10-CM | POA: Diagnosis not present

## 2024-09-15 DIAGNOSIS — B351 Tinea unguium: Secondary | ICD-10-CM | POA: Diagnosis not present

## 2024-09-23 NOTE — Progress Notes (Signed)
"  °  Subjective:  Patient ID: Stephanie Middleton, female    DOB: [birth year],  MRN: [id number]  Stephanie Middleton presents to clinic today for at risk foot care with h/o NIDDM with ESRD on peritoneal dialysis and painful mycotic toenails of both feet that are difficult to trim. Pain interferes with daily activities and wearing enclosed shoe gear comfortably. Her daughter is present during today's visit. Chief Complaint  Patient presents with   Diabetes    DFC. A1c 6.1 She saw Dr. Leigh in Oct. She complains about the medial boarder of the right great toe   New problem(s): None.   PCP is Leigh Lung, MD.  Allergies[1]  Review of Systems: Negative except as noted in the HPI.  Objective: No changes noted in today's physical examination. There were no vitals filed for this visit. Stephanie Middleton is a pleasant 89 y.o. female in NAD. AAO x 3.  Vascular Examination: Capillary refill time immediate b/l. Vascular status intact b/l with palpable pedal pulses. Pedal hair present b/l. No pain with calf compression b/l. Skin temperature gradient WNL b/l. No cyanosis or clubbing b/l. No ischemia or gangrene noted b/l. Dependent edema noted BLE.  Neurological Examination: Protective sensation intact 5/5 intact bilaterally with 10g monofilament b/l. Vibratory sensation intact right lower extremity. Vibratory sensation diminished left lower extremity.  Dermatological Examination: Pedal skin with normal turgor, texture and tone b/l. No open wounds nor interdigital macerations noted.   Incurvated nailplate right great toe medial border(s) with tenderness to palpation. No erythema, no edema, no drainage noted.   Toenails left great toe and 2-5 b/l thick, discolored, elongated with subungual debris and pain on dorsal palpation. No hyperkeratotic lesions noted b/l.   Musculoskeletal Examination: Muscle strength 5/5 to all lower extremity muscle groups bilaterally. HAV with bunion deformity noted b/l LE.  Hammertoe deformity noted 2-5 b/l.  Radiographs: None  Assessment/Plan: 1. Pain due to onychomycosis of toenails of both feet   2. Diabetes mellitus due to underlying condition with chronic kidney disease on chronic dialysis, without long-term current use of insulin (HCC)   -Consent given for treatment as described below: -Examined patient. -Patient to continue soft, supportive shoe gear daily. -Mycotic toenails 2-5 bilaterally and left great toe were debrided in length and girth with sterile nail nippers and dremel without iatrogenic bleeding. -No invasive procedure(s) performed. Offending nail border debrided and curretaged right great toe utilizing sterile nail nipper and currette. Border cleansed with alcohol and triple antibiotic ointment applied. No further treatment required by patient/caregiver. Call office if there are any concerns. -Patient/POA to call should there be question/concern in the interim.   Return in about 3 months (around 12/14/2024).  Delon LITTIE Merlin, DPM      [place] LOCATION: [address], [place] [address]                   Office [phone]   [facility] LOCATION: [address] [place], [place] [address] Office [phone]     [1]  Allergies Allergen Reactions   Chlorhexidine Itching   "

## 2024-10-02 ENCOUNTER — Encounter: Payer: Self-pay | Admitting: *Deleted

## 2024-12-21 ENCOUNTER — Ambulatory Visit: Admitting: Podiatry
